# Patient Record
Sex: Female | Born: 1950 | Race: White | Hispanic: No | Smoking: Current every day smoker
Health system: Southern US, Community
[De-identification: ages and names within clinical notes are randomized; demographics above are authoritative.]

## PROBLEM LIST (undated history)

## (undated) DIAGNOSIS — J189 Pneumonia, unspecified organism: Secondary | ICD-10-CM

## (undated) DIAGNOSIS — Z8669 Personal history of other diseases of the nervous system and sense organs: Secondary | ICD-10-CM

## (undated) DIAGNOSIS — I10 Essential (primary) hypertension: Secondary | ICD-10-CM

## (undated) DIAGNOSIS — R51 Headache: Secondary | ICD-10-CM

## (undated) DIAGNOSIS — K254 Chronic or unspecified gastric ulcer with hemorrhage: Secondary | ICD-10-CM

## (undated) DIAGNOSIS — R911 Solitary pulmonary nodule: Secondary | ICD-10-CM

## (undated) DIAGNOSIS — M810 Age-related osteoporosis without current pathological fracture: Secondary | ICD-10-CM

## (undated) DIAGNOSIS — G629 Polyneuropathy, unspecified: Secondary | ICD-10-CM

## (undated) DIAGNOSIS — F419 Anxiety disorder, unspecified: Secondary | ICD-10-CM

## (undated) DIAGNOSIS — I2510 Atherosclerotic heart disease of native coronary artery without angina pectoris: Secondary | ICD-10-CM

## (undated) DIAGNOSIS — Z860100 Personal history of colon polyps, unspecified: Secondary | ICD-10-CM

## (undated) DIAGNOSIS — J45909 Unspecified asthma, uncomplicated: Secondary | ICD-10-CM

## (undated) DIAGNOSIS — M1990 Unspecified osteoarthritis, unspecified site: Secondary | ICD-10-CM

## (undated) DIAGNOSIS — F32A Depression, unspecified: Secondary | ICD-10-CM

## (undated) DIAGNOSIS — I639 Cerebral infarction, unspecified: Secondary | ICD-10-CM

## (undated) DIAGNOSIS — M797 Fibromyalgia: Secondary | ICD-10-CM

## (undated) DIAGNOSIS — F99 Mental disorder, not otherwise specified: Secondary | ICD-10-CM

## (undated) DIAGNOSIS — G8929 Other chronic pain: Secondary | ICD-10-CM

## (undated) DIAGNOSIS — N189 Chronic kidney disease, unspecified: Secondary | ICD-10-CM

## (undated) DIAGNOSIS — E11319 Type 2 diabetes mellitus with unspecified diabetic retinopathy without macular edema: Secondary | ICD-10-CM

## (undated) DIAGNOSIS — F329 Major depressive disorder, single episode, unspecified: Secondary | ICD-10-CM

## (undated) DIAGNOSIS — E7800 Pure hypercholesterolemia, unspecified: Secondary | ICD-10-CM

## (undated) DIAGNOSIS — R569 Unspecified convulsions: Secondary | ICD-10-CM

## (undated) DIAGNOSIS — R112 Nausea with vomiting, unspecified: Secondary | ICD-10-CM

## (undated) DIAGNOSIS — N183 Chronic kidney disease, stage 3 unspecified: Secondary | ICD-10-CM

## (undated) DIAGNOSIS — Z9989 Dependence on other enabling machines and devices: Secondary | ICD-10-CM

## (undated) DIAGNOSIS — R1310 Dysphagia, unspecified: Secondary | ICD-10-CM

## (undated) DIAGNOSIS — I219 Acute myocardial infarction, unspecified: Secondary | ICD-10-CM

## (undated) DIAGNOSIS — E1140 Type 2 diabetes mellitus with diabetic neuropathy, unspecified: Secondary | ICD-10-CM

## (undated) DIAGNOSIS — J40 Bronchitis, not specified as acute or chronic: Secondary | ICD-10-CM

## (undated) DIAGNOSIS — K219 Gastro-esophageal reflux disease without esophagitis: Secondary | ICD-10-CM

## (undated) DIAGNOSIS — Z9889 Other specified postprocedural states: Secondary | ICD-10-CM

## (undated) DIAGNOSIS — Z720 Tobacco use: Secondary | ICD-10-CM

## (undated) DIAGNOSIS — R0602 Shortness of breath: Secondary | ICD-10-CM

## (undated) DIAGNOSIS — R413 Other amnesia: Secondary | ICD-10-CM

## (undated) DIAGNOSIS — R011 Cardiac murmur, unspecified: Secondary | ICD-10-CM

## (undated) DIAGNOSIS — E119 Type 2 diabetes mellitus without complications: Secondary | ICD-10-CM

## (undated) DIAGNOSIS — J449 Chronic obstructive pulmonary disease, unspecified: Secondary | ICD-10-CM

## (undated) DIAGNOSIS — H4901 Third [oculomotor] nerve palsy, right eye: Secondary | ICD-10-CM

## (undated) HISTORY — DX: Atherosclerotic heart disease of native coronary artery without angina pectoris: I25.10

## (undated) HISTORY — PX: CERVICAL FUSION: SHX112

## (undated) HISTORY — DX: Other chronic pain: G89.29

## (undated) HISTORY — DX: Personal history of colon polyps, unspecified: Z86.0100

## (undated) HISTORY — PX: APPENDECTOMY: SHX54

## (undated) HISTORY — PX: BACK SURGERY: SHX140

## (undated) HISTORY — DX: Pure hypercholesterolemia, unspecified: E78.00

## (undated) HISTORY — DX: Personal history of colonic polyps: Z86.010

## (undated) HISTORY — DX: Dysphagia, unspecified: R13.10

## (undated) HISTORY — DX: Age-related osteoporosis without current pathological fracture: M81.0

## (undated) HISTORY — DX: Type 2 diabetes mellitus with diabetic neuropathy, unspecified: E11.40

## (undated) HISTORY — PX: ESOPHAGOGASTRODUODENOSCOPY: SHX1529

## (undated) HISTORY — DX: Unspecified convulsions: R56.9

## (undated) HISTORY — PX: EYE SURGERY: SHX253

## (undated) HISTORY — PX: ABDOMINAL HYSTERECTOMY: SHX81

## (undated) HISTORY — DX: Solitary pulmonary nodule: R91.1

## (undated) HISTORY — DX: Acute myocardial infarction, unspecified: I21.9

## (undated) HISTORY — PX: TUBAL LIGATION: SHX77

## (undated) HISTORY — DX: Cerebral infarction, unspecified: I63.9

## (undated) HISTORY — DX: Essential (primary) hypertension: I10

## (undated) HISTORY — DX: Chronic or unspecified gastric ulcer with hemorrhage: K25.4

## (undated) HISTORY — DX: Chronic kidney disease, stage 3 unspecified: N18.30

## (undated) HISTORY — DX: Tobacco use: Z72.0

## (undated) HISTORY — DX: Type 2 diabetes mellitus with unspecified diabetic retinopathy without macular edema: E11.319

---

## 1982-07-05 HISTORY — PX: TUBAL LIGATION: SHX77

## 2005-03-05 ENCOUNTER — Inpatient Hospital Stay (HOSPITAL_COMMUNITY): Admission: RE | Admit: 2005-03-05 | Discharge: 2005-03-07 | Payer: Self-pay | Admitting: Neurosurgery

## 2012-07-19 ENCOUNTER — Other Ambulatory Visit: Payer: Self-pay | Admitting: Neurological Surgery

## 2012-07-19 DIAGNOSIS — M545 Low back pain, unspecified: Secondary | ICD-10-CM

## 2012-07-23 ENCOUNTER — Ambulatory Visit
Admission: RE | Admit: 2012-07-23 | Discharge: 2012-07-23 | Disposition: A | Discharge status: Home / Self Care | Payer: No Typology Code available for payment source | Source: Ambulatory Visit | Attending: Neurological Surgery | Admitting: Neurological Surgery

## 2012-07-23 DIAGNOSIS — M545 Low back pain, unspecified: Secondary | ICD-10-CM

## 2012-11-10 ENCOUNTER — Other Ambulatory Visit: Payer: Self-pay | Admitting: Neurological Surgery

## 2012-11-17 ENCOUNTER — Encounter (HOSPITAL_COMMUNITY)
Admission: RE | Admit: 2012-11-17 | Discharge: 2012-11-17 | Disposition: A | Discharge status: Home / Self Care | Payer: BC Managed Care – PPO | Source: Ambulatory Visit | Attending: Anesthesiology | Admitting: Anesthesiology

## 2012-11-17 ENCOUNTER — Encounter (HOSPITAL_COMMUNITY): Payer: Self-pay

## 2012-11-17 ENCOUNTER — Encounter (HOSPITAL_COMMUNITY)
Admission: RE | Admit: 2012-11-17 | Discharge: 2012-11-17 | Disposition: A | Discharge status: Home / Self Care | Payer: BC Managed Care – PPO | Source: Ambulatory Visit | Attending: Neurological Surgery | Admitting: Neurological Surgery

## 2012-11-17 DIAGNOSIS — R011 Cardiac murmur, unspecified: Secondary | ICD-10-CM | POA: Insufficient documentation

## 2012-11-17 DIAGNOSIS — R0602 Shortness of breath: Secondary | ICD-10-CM | POA: Insufficient documentation

## 2012-11-17 DIAGNOSIS — Z01810 Encounter for preprocedural cardiovascular examination: Secondary | ICD-10-CM | POA: Insufficient documentation

## 2012-11-17 DIAGNOSIS — J45909 Unspecified asthma, uncomplicated: Secondary | ICD-10-CM | POA: Insufficient documentation

## 2012-11-17 DIAGNOSIS — K219 Gastro-esophageal reflux disease without esophagitis: Secondary | ICD-10-CM | POA: Insufficient documentation

## 2012-11-17 DIAGNOSIS — J4489 Other specified chronic obstructive pulmonary disease: Secondary | ICD-10-CM | POA: Insufficient documentation

## 2012-11-17 DIAGNOSIS — Z01818 Encounter for other preprocedural examination: Secondary | ICD-10-CM | POA: Insufficient documentation

## 2012-11-17 DIAGNOSIS — Z01812 Encounter for preprocedural laboratory examination: Secondary | ICD-10-CM | POA: Insufficient documentation

## 2012-11-17 DIAGNOSIS — M4800 Spinal stenosis, site unspecified: Secondary | ICD-10-CM | POA: Insufficient documentation

## 2012-11-17 DIAGNOSIS — J449 Chronic obstructive pulmonary disease, unspecified: Secondary | ICD-10-CM | POA: Insufficient documentation

## 2012-11-17 HISTORY — DX: Other specified postprocedural states: R11.2

## 2012-11-17 HISTORY — DX: Depression, unspecified: F32.A

## 2012-11-17 HISTORY — DX: Cardiac murmur, unspecified: R01.1

## 2012-11-17 HISTORY — DX: Polyneuropathy, unspecified: G62.9

## 2012-11-17 HISTORY — DX: Major depressive disorder, single episode, unspecified: F32.9

## 2012-11-17 HISTORY — DX: Unspecified osteoarthritis, unspecified site: M19.90

## 2012-11-17 HISTORY — DX: Anxiety disorder, unspecified: F41.9

## 2012-11-17 HISTORY — DX: Mental disorder, not otherwise specified: F99

## 2012-11-17 HISTORY — DX: Gastro-esophageal reflux disease without esophagitis: K21.9

## 2012-11-17 HISTORY — DX: Chronic obstructive pulmonary disease, unspecified: J44.9

## 2012-11-17 HISTORY — DX: Unspecified asthma, uncomplicated: J45.909

## 2012-11-17 HISTORY — DX: Other specified postprocedural states: Z98.890

## 2012-11-17 HISTORY — DX: Shortness of breath: R06.02

## 2012-11-17 HISTORY — DX: Personal history of other diseases of the nervous system and sense organs: Z86.69

## 2012-11-17 HISTORY — DX: Type 2 diabetes mellitus without complications: E11.9

## 2012-11-17 LAB — BASIC METABOLIC PANEL
Chloride: 92 mEq/L — ABNORMAL LOW (ref 96–112)
GFR calc Af Amer: >90 mL/min (ref >90)
Potassium: 4.1 mEq/L (ref 3.5–5.1)

## 2012-11-17 LAB — CBC WITH DIFFERENTIAL/PLATELET
Basophils Absolute: 0.1 10*3/uL (ref 0.0–0.1)
HCT: 45.6 % (ref 36.0–46.0)
Hemoglobin: 16.9 g/dL — ABNORMAL HIGH (ref 12.0–15.0)
Lymphocytes Relative: 38 % (ref 12–46)
Monocytes Absolute: 0.8 10*3/uL (ref 0.1–1.0)
Monocytes Relative: 7 % (ref 3–12)
Neutro Abs: 5.6 10*3/uL (ref 1.7–7.7)
RDW: 12.7 % (ref 11.5–15.5)
WBC: 11.1 10*3/uL — ABNORMAL HIGH (ref 4.0–10.5)

## 2012-11-17 LAB — TYPE AND SCREEN
ABO/RH(D): O POS
Antibody Screen: NEGATIVE

## 2012-11-17 LAB — SURGICAL PCR SCREEN
MRSA, PCR: NEGATIVE
Staphylococcus aureus: NEGATIVE

## 2012-11-17 NOTE — Pre-Procedure Instructions (Signed)
ANGELIC SCHNELLE  11/17/2012   Your procedure is scheduled on:  Thursday, May 22nd  Report to [facility] at 0530 AM.  Call this number if you have problems the morning of surgery: [phone]   Remember:   Do not eat food or drink liquids after midnight.    Take these medicines the morning of surgery with A SIP OF WATER:    Do not wear jewelry, make-up or nail polish.  Do not wear lotions, powders, or perfumes,deodorant.  Do not shave 48 hours prior to surgery.  Do not bring valuables to the hospital.  Contacts, dentures or bridgework may not be worn into surgery.  Leave suitcase in the car. After surgery it may be brought to your room.  For patients admitted to the hospital, checkout time is 11:00 AM the day of discharge.   Patients discharged the day of surgery will not be allowed to drive home.    Special Instructions: Shower using CHG 2 nights before surgery and the night before surgery.  If you shower the day of surgery use CHG.  Use special wash - you have one bottle of CHG for all showers.  You should use approximately 1/3 of the bottle for each shower.   Please read over the following fact sheets that you were given: Pain Booklet, Coughing and Deep Breathing, Blood Transfusion Information, MRSA Information and Surgical Site Infection Prevention

## 2012-11-17 NOTE — Progress Notes (Addendum)
Primary physician - Dr. Alvira Monday in [place] No cardiologist - no previous cardiac testing  Patient states normal fasting blood sugar is usually around 275. She takes 6 units of novolog "with her biggest meal of day" so only once per day and takes 30 units of lantus at bedtime

## 2012-11-21 NOTE — Progress Notes (Signed)
Anesthesia Chart Review:  Patient is a 62 year old female posted for one level PLIF by Dr. Yetta Barre on 11/23/12.  History includes former smoker, post-operative N/V, DM on insulin, COPD, depression, anxiety, asthma, GERD, arthritis, hysterectomy, heart murmur (not specified), cervical fusion. No PCP is listed.  EKG on 11/17/12 showed NSR.  CXR on 11/17/12 showed no acute disease.  Preoperative labs noted.  Non-fasting glucose is 470.  K+ 4.1.  WBC 11.1.  I called abnormal lab results to [place] at [facility] yesterday afternoon and recommended that patient be re-evaluated (or at least communication) by her PCP for better glucose control pre-operatively.  Erie Noe was told patient is a risk for being canceled if she presents with a significantly elevated glucose reading.  Erie Noe will contact the patient regarding PCP follow-up for hyperglycemia.  Anesthesiologist Dr. Michelle Piper updated on this plan.  I also left a message for patient to call me.  Velna Ochs [facility]/Anesthesiology Phone [phone] 11/21/2012 11:07 AM

## 2012-11-22 MED ORDER — DEXAMETHASONE SODIUM PHOSPHATE 10 MG/ML IJ SOLN
10.0000 mg | INTRAMUSCULAR | Status: AC
  Administered 2012-11-23: 10 mg via INTRAVENOUS
  Filled 2012-11-22: qty 1

## 2012-11-23 ENCOUNTER — Encounter (HOSPITAL_COMMUNITY)
Admission: RE | Disposition: A | Discharge status: Home / Self Care | Payer: Self-pay | Source: Ambulatory Visit | Attending: Neurological Surgery

## 2012-11-23 ENCOUNTER — Encounter (HOSPITAL_COMMUNITY): Payer: Self-pay | Admitting: Vascular Surgery

## 2012-11-23 ENCOUNTER — Encounter (HOSPITAL_COMMUNITY): Payer: Self-pay

## 2012-11-23 ENCOUNTER — Ambulatory Visit (HOSPITAL_COMMUNITY): Payer: BC Managed Care – PPO

## 2012-11-23 ENCOUNTER — Ambulatory Visit (HOSPITAL_COMMUNITY): Payer: BC Managed Care – PPO | Admitting: Anesthesiology

## 2012-11-23 ENCOUNTER — Inpatient Hospital Stay (HOSPITAL_COMMUNITY)
Admission: RE | Admit: 2012-11-23 | Discharge: 2012-11-25 | DRG: 755 | Disposition: A | Discharge status: Home / Self Care | Payer: BC Managed Care – PPO | Source: Ambulatory Visit | Attending: Neurological Surgery | Admitting: Neurological Surgery

## 2012-11-23 DIAGNOSIS — K219 Gastro-esophageal reflux disease without esophagitis: Secondary | ICD-10-CM | POA: Diagnosis present

## 2012-11-23 DIAGNOSIS — E119 Type 2 diabetes mellitus without complications: Secondary | ICD-10-CM | POA: Diagnosis present

## 2012-11-23 DIAGNOSIS — J449 Chronic obstructive pulmonary disease, unspecified: Secondary | ICD-10-CM | POA: Diagnosis present

## 2012-11-23 DIAGNOSIS — Z880 Allergy status to penicillin: Secondary | ICD-10-CM

## 2012-11-23 DIAGNOSIS — M5137 Other intervertebral disc degeneration, lumbosacral region: Secondary | ICD-10-CM | POA: Diagnosis present

## 2012-11-23 DIAGNOSIS — Z79899 Other long term (current) drug therapy: Secondary | ICD-10-CM

## 2012-11-23 DIAGNOSIS — M129 Arthropathy, unspecified: Secondary | ICD-10-CM | POA: Diagnosis present

## 2012-11-23 DIAGNOSIS — F411 Generalized anxiety disorder: Secondary | ICD-10-CM | POA: Diagnosis present

## 2012-11-23 DIAGNOSIS — F3289 Other specified depressive episodes: Secondary | ICD-10-CM | POA: Diagnosis present

## 2012-11-23 DIAGNOSIS — Z981 Arthrodesis status: Secondary | ICD-10-CM

## 2012-11-23 DIAGNOSIS — Z794 Long term (current) use of insulin: Secondary | ICD-10-CM

## 2012-11-23 DIAGNOSIS — Z87891 Personal history of nicotine dependence: Secondary | ICD-10-CM

## 2012-11-23 DIAGNOSIS — M47817 Spondylosis without myelopathy or radiculopathy, lumbosacral region: Principal | ICD-10-CM | POA: Diagnosis present

## 2012-11-23 DIAGNOSIS — J4489 Other specified chronic obstructive pulmonary disease: Secondary | ICD-10-CM | POA: Diagnosis present

## 2012-11-23 DIAGNOSIS — F329 Major depressive disorder, single episode, unspecified: Secondary | ICD-10-CM | POA: Diagnosis present

## 2012-11-23 DIAGNOSIS — M51379 Other intervertebral disc degeneration, lumbosacral region without mention of lumbar back pain or lower extremity pain: Secondary | ICD-10-CM | POA: Diagnosis present

## 2012-11-23 LAB — GLUCOSE, CAPILLARY
Glucose-Capillary: 224 mg/dL — ABNORMAL HIGH (ref 70–99)
Glucose-Capillary: 287 mg/dL — ABNORMAL HIGH (ref 70–99)
Glucose-Capillary: 303 mg/dL — ABNORMAL HIGH (ref 70–99)

## 2012-11-23 SURGERY — FOR MAXIMUM ACCESS (MAS) POSTERIOR LUMBAR INTERBODY FUSION (PLIF) 1 LEVEL
Anesthesia: General | Site: Spine Lumbar | Wound class: Clean

## 2012-11-23 MED ORDER — SODIUM CHLORIDE 0.9 % IR SOLN
Status: DC | PRN
  Administered 2012-11-23: 09:00:00

## 2012-11-23 MED ORDER — PROPOFOL 10 MG/ML IV BOLUS
INTRAVENOUS | Status: DC | PRN
  Administered 2012-11-23: 130 mg via INTRAVENOUS

## 2012-11-23 MED ORDER — METHOCARBAMOL 100 MG/ML IJ SOLN
500.0000 mg | Freq: Four times a day (QID) | INTRAVENOUS | Status: DC | PRN
  Administered 2012-11-23: 500 mg via INTRAVENOUS
  Filled 2012-11-23 (×3): qty 5

## 2012-11-23 MED ORDER — INSULIN ASPART 100 UNIT/ML ~~LOC~~ SOLN: 6.0000 [IU] | Freq: Every day | SUBCUTANEOUS | Status: DC

## 2012-11-23 MED ORDER — MENTHOL 3 MG MT LOZG: 1.0000 | LOZENGE | OROMUCOSAL | Status: DC | PRN

## 2012-11-23 MED ORDER — HYDROMORPHONE HCL PF 1 MG/ML IJ SOLN
INTRAMUSCULAR | Status: AC
  Filled 2012-11-23: qty 1

## 2012-11-23 MED ORDER — SODIUM CHLORIDE 0.9 % IV SOLN
INTRAVENOUS | Status: AC
  Filled 2012-11-23: qty 500

## 2012-11-23 MED ORDER — INSULIN GLARGINE 100 UNIT/ML ~~LOC~~ SOLN
30.0000 [IU] | Freq: Every day | SUBCUTANEOUS | Status: DC
  Administered 2012-11-23 – 2012-11-24 (×2): 30 [IU] via SUBCUTANEOUS
  Filled 2012-11-23 (×3): qty 0.3

## 2012-11-23 MED ORDER — SODIUM CHLORIDE 0.9 % IJ SOLN
3.0000 mL | INTRAMUSCULAR | Status: DC | PRN
  Administered 2012-11-25: 3 mL via INTRAVENOUS

## 2012-11-23 MED ORDER — THROMBIN 20000 UNITS EX SOLR
CUTANEOUS | Status: DC | PRN
  Administered 2012-11-23: 09:00:00 via TOPICAL

## 2012-11-23 MED ORDER — SCOPOLAMINE 1 MG/3DAYS TD PT72
MEDICATED_PATCH | TRANSDERMAL | Status: AC
  Administered 2012-11-23: 1 via TRANSDERMAL
  Filled 2012-11-23: qty 1

## 2012-11-23 MED ORDER — ZOLPIDEM TARTRATE 5 MG PO TABS
5.0000 mg | ORAL_TABLET | Freq: Every day | ORAL | Status: DC
  Administered 2012-11-23 – 2012-11-24 (×2): 5 mg via ORAL
  Filled 2012-11-23 (×2): qty 1

## 2012-11-23 MED ORDER — SODIUM CHLORIDE 0.9 % IJ SOLN
3.0000 mL | Freq: Two times a day (BID) | INTRAMUSCULAR | Status: DC
  Administered 2012-11-24: 3 mL via INTRAVENOUS

## 2012-11-23 MED ORDER — PREGABALIN 50 MG PO CAPS
75.0000 mg | ORAL_CAPSULE | Freq: Two times a day (BID) | ORAL | Status: DC
  Administered 2012-11-24 – 2012-11-25 (×3): 75 mg via ORAL
  Filled 2012-11-23: qty 1
  Filled 2012-11-23 (×2): qty 3

## 2012-11-23 MED ORDER — PHENYLEPHRINE HCL 10 MG/ML IJ SOLN
INTRAMUSCULAR | Status: DC | PRN
  Administered 2012-11-23 (×5): 80 ug via INTRAVENOUS

## 2012-11-23 MED ORDER — INSULIN GLARGINE 100 UNITS/ML SOLOSTAR PEN: 30.0000 [IU] | PEN_INJECTOR | Freq: Every day | SUBCUTANEOUS | Status: DC

## 2012-11-23 MED ORDER — 0.9 % SODIUM CHLORIDE (POUR BTL) OPTIME
TOPICAL | Status: DC | PRN
  Administered 2012-11-23: 1000 mL

## 2012-11-23 MED ORDER — DULOXETINE HCL 60 MG PO CPEP
60.0000 mg | ORAL_CAPSULE | Freq: Every day | ORAL | Status: DC
  Administered 2012-11-24 – 2012-11-25 (×2): 60 mg via ORAL
  Filled 2012-11-23 (×2): qty 1

## 2012-11-23 MED ORDER — PHENOL 1.4 % MT LIQD: 1.0000 | OROMUCOSAL | Status: DC | PRN

## 2012-11-23 MED ORDER — METHOCARBAMOL 500 MG PO TABS
500.0000 mg | ORAL_TABLET | Freq: Four times a day (QID) | ORAL | Status: DC | PRN
  Administered 2012-11-24: 500 mg via ORAL
  Filled 2012-11-23 (×2): qty 1

## 2012-11-23 MED ORDER — LIDOCAINE HCL 4 % MT SOLN
OROMUCOSAL | Status: DC | PRN
  Administered 2012-11-23: 4 mL via TOPICAL

## 2012-11-23 MED ORDER — FENTANYL CITRATE 0.05 MG/ML IJ SOLN
INTRAMUSCULAR | Status: DC | PRN
  Administered 2012-11-23: 100 ug via INTRAVENOUS
  Administered 2012-11-23 (×5): 50 ug via INTRAVENOUS

## 2012-11-23 MED ORDER — ONDANSETRON HCL 4 MG/2ML IJ SOLN
INTRAMUSCULAR | Status: DC | PRN
  Administered 2012-11-23: 4 mg via INTRAVENOUS

## 2012-11-23 MED ORDER — INSULIN ASPART 100 UNIT/ML ~~LOC~~ SOLN
0.0000 [IU] | Freq: Every day | SUBCUTANEOUS | Status: DC
  Administered 2012-11-23: 4 [IU] via SUBCUTANEOUS
  Administered 2012-11-24: 2 [IU] via SUBCUTANEOUS

## 2012-11-23 MED ORDER — SUCCINYLCHOLINE CHLORIDE 20 MG/ML IJ SOLN
INTRAMUSCULAR | Status: DC | PRN
  Administered 2012-11-23: 100 mg via INTRAVENOUS

## 2012-11-23 MED ORDER — MIDAZOLAM HCL 5 MG/5ML IJ SOLN
INTRAMUSCULAR | Status: DC | PRN
  Administered 2012-11-23: 1 mg via INTRAVENOUS

## 2012-11-23 MED ORDER — ONDANSETRON HCL 4 MG/2ML IJ SOLN: 4.0000 mg | INTRAMUSCULAR | Status: DC | PRN

## 2012-11-23 MED ORDER — CELECOXIB 200 MG PO CAPS
200.0000 mg | ORAL_CAPSULE | Freq: Two times a day (BID) | ORAL | Status: DC
  Administered 2012-11-23 – 2012-11-25 (×4): 200 mg via ORAL
  Filled 2012-11-23 (×7): qty 1

## 2012-11-23 MED ORDER — BUPIVACAINE HCL (PF) 0.25 % IJ SOLN
INTRAMUSCULAR | Status: DC | PRN
  Administered 2012-11-23: 6 mL

## 2012-11-23 MED ORDER — ACETAMINOPHEN 325 MG PO TABS: 650.0000 mg | ORAL_TABLET | ORAL | Status: DC | PRN

## 2012-11-23 MED ORDER — INSULIN ASPART 100 UNIT/ML FLEXPEN: 6.0000 [IU] | PEN_INJECTOR | Freq: Every day | SUBCUTANEOUS | Status: DC

## 2012-11-23 MED ORDER — HYDROMORPHONE HCL PF 1 MG/ML IJ SOLN
0.5000 mg | INTRAMUSCULAR | Status: DC | PRN
  Administered 2012-11-23: 1 mg via INTRAVENOUS
  Filled 2012-11-23: qty 1

## 2012-11-23 MED ORDER — POTASSIUM CHLORIDE IN NACL 20-0.9 MEQ/L-% IV SOLN
INTRAVENOUS | Status: DC
  Administered 2012-11-23 – 2012-11-24 (×2): via INTRAVENOUS
  Filled 2012-11-23 (×5): qty 1000

## 2012-11-23 MED ORDER — LIDOCAINE HCL (CARDIAC) 20 MG/ML IV SOLN
INTRAVENOUS | Status: DC | PRN
  Administered 2012-11-23: 70 mg via INTRAVENOUS

## 2012-11-23 MED ORDER — OXYCODONE HCL 5 MG PO TABS
10.0000 mg | ORAL_TABLET | ORAL | Status: DC | PRN
  Administered 2012-11-23 – 2012-11-25 (×8): 10 mg via ORAL
  Filled 2012-11-23 (×9): qty 2

## 2012-11-23 MED ORDER — INSULIN ASPART 100 UNIT/ML ~~LOC~~ SOLN
0.0000 [IU] | Freq: Three times a day (TID) | SUBCUTANEOUS | Status: DC
  Administered 2012-11-23: 8 [IU] via SUBCUTANEOUS
  Administered 2012-11-24 (×2): 5 [IU] via SUBCUTANEOUS
  Administered 2012-11-24: 3 [IU] via SUBCUTANEOUS

## 2012-11-23 MED ORDER — LACTATED RINGERS IV SOLN
INTRAVENOUS | Status: DC | PRN
  Administered 2012-11-23 (×2): via INTRAVENOUS

## 2012-11-23 MED ORDER — BACITRACIN 50000 UNITS IM SOLR
INTRAMUSCULAR | Status: AC
  Filled 2012-11-23: qty 1

## 2012-11-23 MED ORDER — ALBUMIN HUMAN 5 % IV SOLN
INTRAVENOUS | Status: DC | PRN
  Administered 2012-11-23: 09:00:00 via INTRAVENOUS

## 2012-11-23 MED ORDER — THROMBIN 5000 UNITS EX SOLR
OROMUCOSAL | Status: DC | PRN
  Administered 2012-11-23: 09:00:00 via TOPICAL

## 2012-11-23 MED ORDER — CEFAZOLIN SODIUM 1-5 GM-% IV SOLN: 1.0000 g | Freq: Three times a day (TID) | INTRAVENOUS | Status: DC

## 2012-11-23 MED ORDER — PHENYLEPHRINE HCL 10 MG/ML IJ SOLN
10.0000 mg | INTRAVENOUS | Status: DC | PRN
  Administered 2012-11-23: 20 ug/min via INTRAVENOUS

## 2012-11-23 MED ORDER — ARTIFICIAL TEARS OP OINT
TOPICAL_OINTMENT | OPHTHALMIC | Status: DC | PRN
  Administered 2012-11-23: 1 via OPHTHALMIC

## 2012-11-23 MED ORDER — SENNA 8.6 MG PO TABS
1.0000 | ORAL_TABLET | Freq: Two times a day (BID) | ORAL | Status: DC
  Administered 2012-11-23 – 2012-11-25 (×4): 8.6 mg via ORAL
  Filled 2012-11-23 (×5): qty 1

## 2012-11-23 MED ORDER — SODIUM CHLORIDE 0.9 % IV SOLN: 250.0000 mL | INTRAVENOUS | Status: DC

## 2012-11-23 MED ORDER — VANCOMYCIN HCL IN DEXTROSE 1-5 GM/200ML-% IV SOLN
1000.0000 mg | Freq: Once | INTRAVENOUS | Status: DC
  Filled 2012-11-23: qty 200

## 2012-11-23 MED ORDER — ONDANSETRON HCL 4 MG/2ML IJ SOLN: 4.0000 mg | Freq: Four times a day (QID) | INTRAMUSCULAR | Status: DC | PRN

## 2012-11-23 MED ORDER — ACETAMINOPHEN 650 MG RE SUPP: 650.0000 mg | RECTAL | Status: DC | PRN

## 2012-11-23 MED ORDER — ACETAMINOPHEN 10 MG/ML IV SOLN
1000.0000 mg | Freq: Four times a day (QID) | INTRAVENOUS | Status: AC
  Administered 2012-11-23 – 2012-11-24 (×4): 1000 mg via INTRAVENOUS
  Filled 2012-11-23 (×5): qty 100

## 2012-11-23 MED ORDER — HYDROMORPHONE HCL PF 1 MG/ML IJ SOLN
0.2500 mg | INTRAMUSCULAR | Status: DC | PRN
  Administered 2012-11-23 (×4): 0.5 mg via INTRAVENOUS

## 2012-11-23 MED ORDER — VANCOMYCIN HCL IN DEXTROSE 1-5 GM/200ML-% IV SOLN
INTRAVENOUS | Status: AC
  Administered 2012-11-23: 1000 mg via INTRAVENOUS
  Filled 2012-11-23: qty 200

## 2012-11-23 MED ORDER — VANCOMYCIN HCL IN DEXTROSE 750-5 MG/150ML-% IV SOLN
750.0000 mg | Freq: Two times a day (BID) | INTRAVENOUS | Status: AC
  Administered 2012-11-23 – 2012-11-24 (×2): 750 mg via INTRAVENOUS
  Filled 2012-11-23 (×2): qty 150

## 2012-11-23 SURGICAL SUPPLY — 67 items
APL SKNCLS STERI-STRIP NONHPOA (GAUZE/BANDAGES/DRESSINGS) ×1
BAG DECANTER FOR FLEXI CONT (MISCELLANEOUS) ×2 IMPLANT
BENZOIN TINCTURE PRP APPL 2/3 (GAUZE/BANDAGES/DRESSINGS) ×2 IMPLANT
BLADE SURG ROTATE 9660 (MISCELLANEOUS) IMPLANT
BONE MATRIX OSTEOCEL PRO MED (Bone Implant) ×1 IMPLANT
BUR MATCHSTICK NEURO 3.0 LAGG (BURR) ×2 IMPLANT
CAGE COROENT 9X9X23-4 (Cage) ×2 IMPLANT
CANISTER SUCTION 2500CC (MISCELLANEOUS) ×2 IMPLANT
CLIP NEUROVISION LG (CLIP) ×1 IMPLANT
CLOTH BEACON ORANGE TIMEOUT ST (SAFETY) ×2 IMPLANT
CONT SPEC 4OZ CLIKSEAL STRL BL (MISCELLANEOUS) ×4 IMPLANT
COVER BACK TABLE 24X17X13 BIG (DRAPES) IMPLANT
COVER TABLE BACK 60X90 (DRAPES) ×2 IMPLANT
DRAPE C-ARM 42X72 X-RAY (DRAPES) ×2 IMPLANT
DRAPE C-ARMOR (DRAPES) ×2 IMPLANT
DRAPE LAPAROTOMY 100X72X124 (DRAPES) ×2 IMPLANT
DRAPE POUCH INSTRU U-SHP 10X18 (DRAPES) ×2 IMPLANT
DRAPE SURG 17X23 STRL (DRAPES) ×2 IMPLANT
DRESSING TELFA 8X3 (GAUZE/BANDAGES/DRESSINGS) ×2 IMPLANT
DRSG OPSITE 4X5.5 SM (GAUZE/BANDAGES/DRESSINGS) ×4 IMPLANT
DURAPREP 26ML APPLICATOR (WOUND CARE) ×2 IMPLANT
ELECT REM PT RETURN 9FT ADLT (ELECTROSURGICAL) ×2
ELECTRODE REM PT RTRN 9FT ADLT (ELECTROSURGICAL) ×1 IMPLANT
EVACUATOR 1/8 PVC DRAIN (DRAIN) ×2 IMPLANT
GAUZE SPONGE 4X4 16PLY XRAY LF (GAUZE/BANDAGES/DRESSINGS) IMPLANT
GLOVE BIOGEL M 8.0 STRL (GLOVE) ×4 IMPLANT
GLOVE BIOGEL PI IND STRL 7.0 (GLOVE) IMPLANT
GLOVE BIOGEL PI IND STRL 8 (GLOVE) IMPLANT
GLOVE BIOGEL PI IND STRL 8.5 (GLOVE) IMPLANT
GLOVE BIOGEL PI INDICATOR 7.0 (GLOVE) ×1
GLOVE BIOGEL PI INDICATOR 8 (GLOVE) ×2
GLOVE BIOGEL PI INDICATOR 8.5 (GLOVE) ×1
GLOVE ECLIPSE 7.5 STRL STRAW (GLOVE) ×3 IMPLANT
GLOVE ECLIPSE 8.5 STRL (GLOVE) ×1 IMPLANT
GOWN BRE IMP SLV AUR LG STRL (GOWN DISPOSABLE) IMPLANT
GOWN BRE IMP SLV AUR XL STRL (GOWN DISPOSABLE) ×4 IMPLANT
GOWN STRL REIN 2XL LVL4 (GOWN DISPOSABLE) ×2 IMPLANT
HEMOSTAT POWDER KIT SURGIFOAM (HEMOSTASIS) IMPLANT
KIT BASIN OR (CUSTOM PROCEDURE TRAY) ×2 IMPLANT
KIT NDL NVM5 EMG ELECT (KITS) IMPLANT
KIT NEEDLE NVM5 EMG ELECT (KITS) ×1 IMPLANT
KIT NEEDLE NVM5 EMG ELECTRODE (KITS) ×1
KIT ROOM TURNOVER OR (KITS) ×2 IMPLANT
MILL MEDIUM DISP (BLADE) ×1 IMPLANT
NDL HYPO 25X1 1.5 SAFETY (NEEDLE) ×1 IMPLANT
NEEDLE HYPO 25X1 1.5 SAFETY (NEEDLE) ×2 IMPLANT
NS IRRIG 1000ML POUR BTL (IV SOLUTION) ×2 IMPLANT
PACK LAMINECTOMY NEURO (CUSTOM PROCEDURE TRAY) ×2 IMPLANT
PAD ARMBOARD 7.5X6 YLW CONV (MISCELLANEOUS) ×8 IMPLANT
ROD 5.5X40MM (Rod) ×2 IMPLANT
SCREW LOCK (Screw) ×8 IMPLANT
SCREW LOCK FXNS SPNE MAS PL (Screw) IMPLANT
SCREW PLIF MAS 5.0X35 (Screw) ×2 IMPLANT
SCREW SHANK 5.0X30MM (Screw) ×2 IMPLANT
SCREW TULIP 5.5 (Screw) ×2 IMPLANT
SPONGE LAP 4X18 X RAY DECT (DISPOSABLE) IMPLANT
SPONGE SURGIFOAM ABS GEL 100 (HEMOSTASIS) ×2 IMPLANT
STRIP CLOSURE SKIN 1/2X4 (GAUZE/BANDAGES/DRESSINGS) ×4 IMPLANT
SUT VIC AB 0 CT1 18XCR BRD8 (SUTURE) ×1 IMPLANT
SUT VIC AB 0 CT1 8-18 (SUTURE) ×4
SUT VIC AB 2-0 CP2 18 (SUTURE) ×3 IMPLANT
SUT VIC AB 3-0 SH 8-18 (SUTURE) ×4 IMPLANT
SYR 20ML ECCENTRIC (SYRINGE) ×2 IMPLANT
TOWEL OR 17X24 6PK STRL BLUE (TOWEL DISPOSABLE) ×2 IMPLANT
TOWEL OR 17X26 10 PK STRL BLUE (TOWEL DISPOSABLE) ×2 IMPLANT
TRAY FOLEY CATH 14FRSI W/METER (CATHETERS) ×2 IMPLANT
WATER STERILE IRR 1000ML POUR (IV SOLUTION) ×2 IMPLANT

## 2012-11-23 NOTE — Transfer of Care (Signed)
Immediate Anesthesia Transfer of Care Note  Patient: [place]  Procedure(s) Performed: Procedure(s) with comments:  MAS POSTERIOR LUMBAR INTERBODY FUSION (PLIF) 1 LEVEL  (N/A) - Lumbar Two-three  Patient Location: PACU  Anesthesia Type:General  Level of Consciousness: awake, alert  and oriented  Airway & Oxygen Therapy: Patient Spontanous Breathing and Patient connected to face mask oxygen  Post-op Assessment: Report given to PACU RN  Post vital signs: Reviewed and stable  Complications: No apparent anesthesia complications

## 2012-11-23 NOTE — Progress Notes (Signed)
Utilization Review Completed.[place] T5/22/2014  

## 2012-11-23 NOTE — Op Note (Signed)
11/23/2012  10:26 AM  PATIENT:  Paula Hamilton  62 y.o. 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 Decompressive lumbar laminectomy L2-3 requiring more work than would be required of the typical PLIF procedure in order to adequately decompress the neural elements and address her spinal stenosis.  2. Posterior lumbar interbody fusion L2-3 using PEEK interbody cages packed with morcellized allograft and autograft   3. Posterior fixation L23 using cortical pedicle screws.    SURGEON:  Marikay Alar, MD  ASSISTANTS: Dr. Danielle Dess  ANESTHESIA:  General  EBL: 150 ml  Total I/O In: 1750 [I.V.:1500; IV Piggyback:250] Out: 650 [Urine:500; Blood:150]  BLOOD ADMINISTERED:none  DRAINS: Hemovac   INDICATION FOR PROCEDURE: This patient presented with a long history of back and leg pain. MRI showed spinal stenosis and degenerative disc disease and spondylosis it worsened over the years. Flexion extension plain from showed segmental instability at L2-3. Recommended a decompression and instrumented fusion to address her spinal stenosis and segmental instability. Patient understood the risks, benefits, and alternatives and potential outcomes and wished to proceed.  PROCEDURE DETAILS:  The patient was brought to the operating room. After induction of generalized endotracheal anesthesia the patient was rolled into the prone position on chest rolls and all pressure points were padded. The patient's lumbar region was cleaned and then prepped with DuraPrep and draped in the usual sterile fashion. Anesthesia was injected and then a dorsal midline incision was made and carried down to the lumbosacral fascia. The fascia was opened and the paraspinous musculature was taken down in a subperiosteal fashion to expose L2-3. Intraoperative fluoroscopy confirmed my level, and I started with placement of the L2  pedicle screws. The pedicle screw entry zones were identified utilizing surface landmarks and AP and lateral fluoroscopy. EMG monitoring was used. I drilled in an upper and outward direction into the pedicles of L2 the hand drill and then tapped line to line. Then placed 50 by 30 mm pedicle screws into the L2 pedicles bilaterally. I then checked these with AP and lateral fluoroscopy. Then turned my attention to the decompression and the spinous process was removed and complete lumbar laminectomies, hemi- facetectomies, and foraminotomies were performed at L2-3. The yellow ligament was removed to expose the underlying dura and nerve roots, and generous foraminotomies were performed to adequately decompress the neural elements. Once the decompression was complete, I turned my attention to the posterior lower lumbar interbody fusion. The epidural venous vasculature was coagulated and cut sharply. Disc space was incised and the initial discectomy was performed with pituitary rongeurs. The disc space was distracted with sequential distractors to a height of 10 mm. We then used a series of scrapers and shavers to prepare the endplates for fusion. The midline was prepared with Epstein curettes. Once the complete discectomy was finished, we packed an appropriate sized peek interbody cage with local autograft and morcellized allograft, gently retracted the nerve root, and tapped the cage into position at L2-3 bilaterally. The midline was packed with morselized autograft and allograft. We then turned our attention to the posterior fixation. The pedicle screw entry zones were identified at L3 utilizing surface landmarks and fluoroscopy. We probed each pedicle with the pedicle probe and tapped each pedicle with the appropriate tap. We palpated with a ball probe to assure no break in the cortex. We then placed 50 by 35 mm pedicle screws into the pedicles bilaterally  at L3.  We then placed lordotic rods into the multiaxial screw  heads of the pedicle screws and locked these in position with the locking caps and anti-torque device. We then checked our construct with AP and lateral fluoroscopy. Irrigated with copious amounts of bacitracin-containing saline solution. Placed a medium Hemovac drain through separate stab incision. Inspected the nerve roots once again to assure adequate decompression, lined to the dura with Gelfoam, and closed the muscle and the fascia with 0 Vicryl. Closed the subcutaneous tissues with 2-0 Vicryl and subcuticular tissues with 3-0 Vicryl. The skin was closed with benzoin and Steri-Strips. Dressing was then applied, the patient was awakened from general anesthesia and transported to the recovery room in stable condition. At the end of the procedure all sponge, needle and instrument counts were correct.   PLAN OF CARE: Admit to inpatient   PATIENT DISPOSITION:  PACU - hemodynamically stable.   Delay start of Pharmacological VTE agent (>24hrs) due to surgical blood loss or risk of bleeding:  yes

## 2012-11-23 NOTE — Preoperative (Signed)
Beta Blockers   Reason not to administer Beta Blockers:Not Applicable 

## 2012-11-23 NOTE — Progress Notes (Addendum)
Inpatient Diabetes Program Recommendations  AACE/ADA: New Consensus Statement on Inpatient Glycemic Control (2013)  Target Ranges:  Prepandial:   less than 140 mg/dL      Peak postprandial:   less than 180 mg/dL (1-2 hours)      Critically ill patients:  140 - 180 mg/dL   Reason for Visit: Results for [place], [place] (MRN [id number]) as of 11/23/2012 14:21  Ref. Range 11/23/2012 06:28 11/23/2012 10:43  Glucose-Capillary Latest Range: 70-99 mg/dL 409 (H) 811 (H)   Note history of diabetes.  Patient was on Lantus 30 units daily and Novolog 6 units with the biggest meal of the day. Please add Lantus 30 units daily and Novolog moderate tid with meals while in the hospital.  Also consider checking A1C to determine pre-hospitalization glycemic control.     Note patient has already been ordered Lantus 30 units daily.  Please discontinue Novolog 6 units at 1000 and add Novolog moderate correction tid with meals and HS scale.

## 2012-11-23 NOTE — Progress Notes (Signed)
ANTIBIOTIC CONSULT NOTE - INITIAL  Pharmacy Consult for vancomycin Indication: surgical prophylaxis  Allergies  Allergen Reactions  . Erythromycin Other (See Comments)    Stomach pains  . Morphine And Related Nausea And Vomiting  . Codeine Rash  . Penicillins Rash    Patient Measurements: Height: 5\' 4"  (162.6 cm) Weight: 134 lb 0.6 oz (60.8 kg) IBW/kg (Calculated) : 54.7  Vital Signs: Temp: 98.2 F (36.8 C) (05/22 1307) Temp src: Oral (05/22 1307) BP: 105/42 mmHg (05/22 1307) Pulse Rate: 103 (05/22 1307) Intake/Output from previous day:   Intake/Output from this shift: Total I/O In: 1750 [I.V.:1500; IV Piggyback:250] Out: 650 [Urine:500; Blood:150]  Labs: No results found for this basename: WBC, HGB, PLT, LABCREA, CREATININE,  in the last 72 hours Estimated Creatinine Clearance: 64.6 ml/min (by C-G formula based on Cr of 0.74). No results found for this basename: VANCOTROUGH, VANCOPEAK, VANCORANDOM, GENTTROUGH, GENTPEAK, GENTRANDOM, TOBRATROUGH, TOBRAPEAK, TOBRARND, AMIKACINPEAK, AMIKACINTROU, AMIKACIN,  in the last 72 hours    Medical History: Past Medical History  Diagnosis Date  . PONV (postoperative nausea and vomiting)   . Shortness of breath   . Heart murmur   . Anxiety   . Mental disorder   . Depression   . Asthma   . COPD (chronic obstructive pulmonary disease)   . Diabetes mellitus without complication     fasting 270s  . GERD (gastroesophageal reflux disease)   . Hx of migraines   . Neuropathy   . Arthritis     Medications:  Prescriptions prior to admission  Medication Sig Dispense Refill  . B COMPLEX-FOLIC ACID PO Take 1 tablet by mouth daily.      . Cholecalciferol (VITAMIN D) 1000 UNITS capsule Take 1,000 Units by mouth daily.      . DiphenhydrAMINE HCl (BENADRYL PO) Take 1 tablet by mouth at bedtime.      . docusate sodium (COLACE) 100 MG capsule Take 100 mg by mouth daily.      . DULoxetine (CYMBALTA) 60 MG capsule Take 60 mg by mouth  daily.      . insulin aspart (NOVOLOG FLEXPEN) 100 unit/mL SOLN FlexPen Inject 6 Units into the skin daily. With biggest meal      . insulin glargine (LANTUS) 100 units/mL SOLN Inject 30 Units into the skin daily at 10 pm.      . oxyCODONE-acetaminophen (PERCOCET/ROXICET) 5-325 MG per tablet Take 1-2 tablets by mouth every 6 (six) hours as needed for pain.      . pregabalin (LYRICA) 75 MG capsule Take 75 mg by mouth 2 (two) times daily.      . rosuvastatin (CRESTOR) 10 MG tablet Take 10 mg by mouth daily.      . vitamin C (ASCORBIC ACID) 500 MG tablet Take 500 mg by mouth daily.       Assessment: 62 yo female s/p lumbar fusion to and to receive vancomycin for 24 hours for post op prophylaxis/ Last vancomycin dose was 1000mg  given at 0730am. Last SCr= 0.74 (11/17/12) with CrCl ~65.  Plan:   -Vancomycin 750mg  q12h x2 doses -Will sign off for now, please contact pharmacy for any other needs  Harland German, Ilda Basset D 11/23/2012 1:57 PM

## 2012-11-23 NOTE — Anesthesia Preprocedure Evaluation (Signed)
Anesthesia Evaluation  Patient identified by MRN, date of birth, ID band Patient awake    Reviewed: Allergy & Precautions, H&P , NPO status , Patient's Chart, lab work & pertinent test results  History of Anesthesia Complications (+) PONV  Airway Mallampati: II  Neck ROM: full    Dental   Pulmonary shortness of breath, asthma , COPDformer smoker,          Cardiovascular     Neuro/Psych Anxiety Depression    GI/Hepatic GERD-  ,  Endo/Other  diabetes, Type 2  Renal/GU      Musculoskeletal  (+) Arthritis -,   Abdominal   Peds  Hematology   Anesthesia Other Findings   Reproductive/Obstetrics                           Anesthesia Physical Anesthesia Plan  ASA: II  Anesthesia Plan: General   Post-op Pain Management:    Induction: Intravenous  Airway Management Planned: Oral ETT  Additional Equipment:   Intra-op Plan:   Post-operative Plan: Extubation in OR  Informed Consent: I have reviewed the patients History and Physical, chart, labs and discussed the procedure including the risks, benefits and alternatives for the proposed anesthesia with the patient or authorized representative who has indicated his/her understanding and acceptance.     Plan Discussed with: CRNA, Anesthesiologist and Surgeon  Anesthesia Plan Comments:         Anesthesia Quick Evaluation

## 2012-11-23 NOTE — Progress Notes (Signed)
Called Dr. Marikay Alar to report allergy to penicillin, which causes rash. . Change antibiotic to vancomycin per Dr. Yetta Barre.

## 2012-11-23 NOTE — H&P (Signed)
Subjective: Patient is a 62 y.o. female admitted for MASPLIF L2-3. Onset of symptoms was several years ago, gradually worsening since that time.  The pain is rated severe, and is located at the across the lower back and radiates to LLE. The pain is described as aching and throbbing and occurs all day. The symptoms have been progressive. Symptoms are exacerbated by exercise and standing. MRI or CT showed instability with stenosis L2-3.   Past Medical History  Diagnosis Date  . PONV (postoperative nausea and vomiting)   . Shortness of breath   . Heart murmur   . Anxiety   . Mental disorder   . Depression   . Asthma   . COPD (chronic obstructive pulmonary disease)   . Diabetes mellitus without complication     fasting 270s  . GERD (gastroesophageal reflux disease)   . Hx of migraines   . Neuropathy   . Arthritis     Past Surgical History  Procedure Laterality Date  . Cervical fusion    . Appendectomy    . Tubal ligation    . Abdominal hysterectomy      Prior to Admission medications   Medication Sig Start Date End Date Taking? Authorizing Provider  B COMPLEX-FOLIC ACID PO Take 1 tablet by mouth daily.   Yes Historical Provider, MD  Cholecalciferol (VITAMIN D) 1000 UNITS capsule Take 1,000 Units by mouth daily.   Yes Historical Provider, MD  DiphenhydrAMINE HCl (BENADRYL PO) Take 1 tablet by mouth at bedtime.   Yes Historical Provider, MD  docusate sodium (COLACE) 100 MG capsule Take 100 mg by mouth daily.   Yes Historical Provider, MD  DULoxetine (CYMBALTA) 60 MG capsule Take 60 mg by mouth daily.   Yes Historical Provider, MD  insulin aspart (NOVOLOG FLEXPEN) 100 unit/mL SOLN FlexPen Inject 6 Units into the skin daily. With biggest meal   Yes Historical Provider, MD  insulin glargine (LANTUS) 100 units/mL SOLN Inject 30 Units into the skin daily at 10 pm.   Yes Historical Provider, MD  oxyCODONE-acetaminophen (PERCOCET/ROXICET) 5-325 MG per tablet Take 1-2 tablets by mouth every 6  (six) hours as needed for pain.   Yes Historical Provider, MD  pregabalin (LYRICA) 75 MG capsule Take 75 mg by mouth 2 (two) times daily.   Yes Historical Provider, MD  rosuvastatin (CRESTOR) 10 MG tablet Take 10 mg by mouth daily.   Yes Historical Provider, MD  vitamin C (ASCORBIC ACID) 500 MG tablet Take 500 mg by mouth daily.   Yes Historical Provider, MD   Allergies  Allergen Reactions  . Erythromycin Other (See Comments)    Stomach pains  . Morphine And Related Nausea And Vomiting  . Codeine Rash  . Penicillins Rash    History  Substance Use Topics  . Smoking status: Former Games developer  . Smokeless tobacco: Not on file  . Alcohol Use: No    History reviewed. No pertinent family history.   Review of Systems  Positive ROS: neg  All other systems have been reviewed and were otherwise negative with the exception of those mentioned in the HPI and as above.  Objective: Vital signs in last 24 hours: Temp:  [97.1 F (36.2 C)] 97.1 F (36.2 C) (05/22 0620) Pulse Rate:  [93] 93 (05/22 0620) Resp:  [20] 20 (05/22 0620) BP: (101)/(67) 101/67 mmHg (05/22 0620) SpO2:  [97 %] 97 % (05/22 0620)  General Appearance: Alert, cooperative, no distress, appears stated age Head: Normocephalic, without obvious abnormality, atraumatic Eyes: PERRL,  conjunctiva/corneas clear, EOM's intact Neck: Supple Back: Symmetric, no curvature Lungs:  respirations unlabored Heart: Regular rate and rhythm Abdomen: Soft, non-tender Extremities: Extremities normal, atraumatic, no cyanosis or edema Pulses: 2+ and symmetric all extremities   NEUROLOGIC:   Mental status: Alert and oriented x4,  no aphasia, good attention span, fund of knowledge, and memory Motor Exam - grossly normal Sensory Exam - grossly normal Reflexes: 1+ Coordination - grossly normal Gait - grossly normal Balance - grossly normal Cranial Nerves: I: smell Not tested  II: visual acuity  OS: nl    OD: nl  II: visual fields Full to  confrontation  II: pupils Equal, round, reactive to light  III,VII: ptosis None  III,IV,VI: extraocular muscles  Full ROM  V: mastication Normal  V: facial light touch sensation  Normal  V,VII: corneal reflex  Present  VII: facial muscle function - upper  Normal  VII: facial muscle function - lower Normal  VIII: hearing Not tested  IX: soft palate elevation  Normal  IX,X: gag reflex Present  XI: trapezius strength  5/5  XI: sternocleidomastoid strength 5/5  XI: neck flexion strength  5/5  XII: tongue strength  Normal    Data Review Lab Results  Component Value Date   WBC 11.1* 11/17/2012   HGB 16.9* 11/17/2012   HCT 45.6 11/17/2012   MCV 89.6 11/17/2012   PLT 340 11/17/2012   Lab Results  Component Value Date   NA 131* 11/17/2012   K 4.1 11/17/2012   CL 92* 11/17/2012   CO2 22 11/17/2012   BUN 13 11/17/2012   CREATININE 0.74 11/17/2012   GLUCOSE 470* 11/17/2012   Lab Results  Component Value Date   INR 0.97 11/17/2012    Assessment/Plan: Patient admitted for MASPLIF L2-3. Patient has failed conservative therapy.  I explained the condition and procedure to the patient and answered any questions.  Patient wishes to proceed with procedure as planned. Understands risks/ benefits and typical outcomes of procedure.   Devynn Scheff S 11/23/2012 7:02 AM

## 2012-11-23 NOTE — Anesthesia Postprocedure Evaluation (Signed)
Anesthesia Post Note  Patient: [place]  Procedure(s) Performed: Procedure(s) (LRB):  MAS POSTERIOR LUMBAR INTERBODY FUSION (PLIF) 1 LEVEL  (N/A)  Anesthesia type: General  Patient location: PACU  Post pain: Pain level controlled and Adequate analgesia  Post assessment: Post-op Vital signs reviewed, Patient's Cardiovascular Status Stable, Respiratory Function Stable, Patent Airway and Pain level controlled  Last Vitals:  Filed Vitals:   11/23/12 1037  BP: 105/51  Pulse: 90  Temp: 36.7 C  Resp:     Post vital signs: Reviewed and stable  Level of consciousness: awake, alert  and oriented  Complications: No apparent anesthesia complications

## 2012-11-24 LAB — HEMOGLOBIN A1C: Hgb A1c MFr Bld: 10.3 % — ABNORMAL HIGH (ref <5.7)

## 2012-11-24 LAB — GLUCOSE, CAPILLARY
Glucose-Capillary: 241 mg/dL — ABNORMAL HIGH (ref 70–99)
Glucose-Capillary: 248 mg/dL — ABNORMAL HIGH (ref 70–99)

## 2012-11-24 MED ORDER — PNEUMOCOCCAL VAC POLYVALENT 25 MCG/0.5ML IJ INJ
0.5000 mL | INJECTION | INTRAMUSCULAR | Status: AC
  Administered 2012-11-25: 0.5 mL via INTRAMUSCULAR
  Filled 2012-11-24: qty 0.5

## 2012-11-24 NOTE — Evaluation (Signed)
Physical Therapy Evaluation Patient Details Name: Paula Hamilton MRN: [id number] DOB: [birth year] Today's Date: 11/24/2012 Time: 4098-1191 PT Time Calculation (min): 21 min  PT Assessment / Plan / Recommendation Clinical Impression  Patient is a 61 yo female s/p L2-3 PLIF.  Did well with mobility today.  Will benefit from acute PT to maximize independence prior to discharge home with family.  Do not anticipate any f/u PT needs.    PT Assessment  Patient needs continued PT services    Follow Up Recommendations  No PT follow up;Supervision/Assistance - 24 hour    Does the patient have the potential to tolerate intense rehabilitation      Barriers to Discharge None      Equipment Recommendations  Rolling walker with 5" wheels (3-in-1 BSC)    Recommendations for Other Services     Frequency Min 5X/week    Precautions / Restrictions Precautions Precautions: Back Precaution Booklet Issued: Yes (comment) Precaution Comments: Provided education on back precautions and mobility while maintaining precautions. Required Braces or Orthoses: Spinal Brace Spinal Brace: Lumbar corset;Applied in sitting position Restrictions Weight Bearing Restrictions: No   Pertinent Vitals/Pain       Mobility  Bed Mobility Bed Mobility: Sit to Sidelying Left Supine to Sit: 4: Min guard;HOB flat Sit to Sidelying Left: 5: Supervision;HOB flat Details for Bed Mobility Assistance: Verbal cues to doff brace.  Cues for technique to move to sidelying and roll to back to maintain back precautions.  Patient with good technique. Transfers Transfers: Sit to Stand;Stand to Sit Sit to Stand: 4: Min guard;With upper extremity assist;With armrests;From chair/3-in-1;From toilet Stand to Sit: 4: Min guard;With upper extremity assist;To toilet;To bed Details for Transfer Assistance: Verbal cues for hand placement and technique. Ambulation/Gait Ambulation/Gait Assistance: 4: Min guard Ambulation Distance  (Feet): 175 Feet Assistive device: Rolling walker Ambulation/Gait Assistance Details: Verbal cues for safe use of RW.  Patient with good gait pattern and upright posture.  Cues to turn body (no twist) when reaching for objects or talking with others. Gait Pattern: Step-through pattern;Decreased step length - right;Decreased step length - left Gait velocity: Slow gait speed    Exercises     PT Diagnosis: Difficulty walking;Acute pain  PT Problem List: Decreased strength;Decreased activity tolerance;Decreased mobility;Decreased balance;Decreased knowledge of use of DME;Decreased knowledge of precautions;Pain PT Treatment Interventions: DME instruction;Gait training;Functional mobility training;Patient/family education   PT Goals Acute Rehab PT Goals PT Goal Formulation: With patient Time For Goal Achievement: 12/01/12 Potential to Achieve Goals: Good Pt will Roll Supine to Right Side: Independently PT Goal: Rolling Supine to Right Side - Progress: Goal set today Pt will Roll Supine to Left Side: Independently PT Goal: Rolling Supine to Left Side - Progress: Goal set today Pt will go Supine/Side to Sit: Independently;with HOB 0 degrees PT Goal: Supine/Side to Sit - Progress: Goal set today Pt will go Sit to Supine/Side: Independently;with HOB 0 degrees PT Goal: Sit to Supine/Side - Progress: Goal set today Pt will go Sit to Stand: with modified independence;with upper extremity assist PT Goal: Sit to Stand - Progress: Goal set today Pt will Ambulate: >150 feet;with modified independence;with rolling walker PT Goal: Ambulate - Progress: Goal set today Additional Goals Additional Goal #1: Patient will recall 3/3 back precautions and integrate into functional mobility PT Goal: Additional Goal #1 - Progress: Goal set today Additional Goal #2: Patient will don/doff brace independently PT Goal: Additional Goal #2 - Progress: Goal set today  Visit Information  Last PT Received On:  11/24/12 Assistance Needed: +1    Subjective Data  Subjective: "It feels better when I'm up walking" Patient Stated Goal: To go home soon   Prior Functioning  Home Living Lives With: Daughter (Son-in-law) Available Help at Discharge: Family;Available 24 hours/day (22yo granddaughter lives next door. Dtr/granddtr do not work) Type of Home: House Home Access: Ramped entrance Home Layout: One level Bathroom Shower/Tub: Counselling psychologist: Yes How Accessible: Accessible via walker Home Adaptive Equipment: Straight cane Prior Function Level of Independence: Independent with assistive device(s);Needs assistance (Used cane at times) Needs Assistance: Bathing;Light Housekeeping Bath: Supervision/set-up Light Housekeeping: Moderate Able to Take Stairs?: Yes Driving: No Vocation: Workers comp Comments: Pt needed assistance with tub/shower mobility sometimes, and has not driven for 6 months PTA Communication Communication: No difficulties Dominant Hand: Right    Cognition  Cognition Arousal/Alertness: Awake/alert Behavior During Therapy: WFL for tasks assessed/performed Overall Cognitive Status: Within Functional Limits for tasks assessed    Extremity/Trunk Assessment Right Upper Extremity Assessment RUE ROM/Strength/Tone: [facility] for tasks assessed Left Upper Extremity Assessment LUE ROM/Strength/Tone: WFL for tasks assessed Right Lower Extremity Assessment RLE ROM/Strength/Tone: WFL for tasks assessed RLE Sensation: WFL - Light Touch Left Lower Extremity Assessment LLE ROM/Strength/Tone: WFL for tasks assessed LLE Sensation: WFL - Light Touch   Balance    End of Session PT - End of Session Equipment Utilized During Treatment: Gait belt;Back brace Activity Tolerance: Patient tolerated treatment well Patient left: in bed;with call bell/phone within reach Nurse Communication: Mobility status  GP     Vena Austria 11/24/2012, 1:22 PM Durenda Hurt. Renaldo Fiddler, [facility] Acute Rehab Services Pager [phone]

## 2012-11-24 NOTE — Progress Notes (Signed)
Patient ID: Paula Hamilton, female   DOB: Feb 02, 1951, 62 y.o.   MRN: [id number] Patient doing very well. No leg pain for the first time in over a year. Back appears appropriately sore. Abdomen is soft. Moves legs well. We'll get her up and walking with therapy today. Hopefully home tomorrow.

## 2012-11-24 NOTE — Progress Notes (Signed)
Inpatient Diabetes Program Recommendations  AACE/ADA: New Consensus Statement on Inpatient Glycemic Control (2013)  Target Ranges:  Prepandial:   less than 140 mg/dL      Peak postprandial:   less than 180 mg/dL (1-2 hours)      Critically ill patients:  140 - 180 mg/dL  Results for Paula Hamilton, Paula Hamilton (MRN [id number]) as of 11/24/2012 12:37  Ref. Range 11/23/2012 06:28 11/23/2012 10:43 11/23/2012 16:39 11/23/2012 21:45 11/24/2012 07:00 11/24/2012 11:46  Glucose-Capillary Latest Range: 70-99 mg/dL 130 (H) 865 (H) 784 (H) 303 (H) 229 (H) 241 (H)   Inpatient Diabetes Program Recommendations Insulin - Basal: Increase Lantus to 35 units  Thank you  Piedad Climes BSN, RN,CDE Inpatient Diabetes Coordinator [phone] (team pager)

## 2012-11-24 NOTE — Progress Notes (Signed)
I agree with the following treatment note after reviewing documentation.   Johnston, Jalal Rauch Brynn   OTR/L Pager: [phone] Office: [phone] .   

## 2012-11-24 NOTE — Progress Notes (Signed)
Occupational Therapy Evaluation Patient Details Name: Paula Hamilton MRN: [id number] DOB: 11-13-1950 Today's Date: 11/24/2012 Time: 4098-1191 OT Time Calculation (min): 36 min  OT Assessment / Plan / Recommendation Clinical Impression  Patient is a 62 y.o. female admitted for MASPLIF L2-3. Pt would benefit from OT services while in actue setting. Pt was mod independent with ADL and supervision for mobility. Pt will d/c home where she has 24 hour care from daughter and son-in-law. Will focus on tub transfer next session.     OT Assessment  Patient needs continued OT Services    Follow Up Recommendations  Supervision/Assistance - 24 hour    Barriers to Discharge None          Frequency  Min 2X/week    Precautions / Restrictions Precautions Precautions: Back Precaution Booklet Issued: Yes (comment) Precaution Comments: Pt educated on "BAT" precautions and brace precautions Required Braces or Orthoses: Spinal Brace Spinal Brace: Lumbar corset;Applied in sitting position Restrictions Weight Bearing Restrictions: No   Pertinent Vitals/Pain Pt reported pain as 5/10 and increased to 7/10 during session. Pt received pain meds from RN as session began.    ADL  Eating/Feeding: Performed;Modified independent Where Assessed - Eating/Feeding: Chair Grooming: Wash/dry hands;Performed;Min guard Where Assessed - Grooming: Unsupported standing Toilet Transfer: Performed;Min guard Statistician Method: Sit to Barista: Comfort height toilet;Grab bars Toileting - Clothing Manipulation and Hygiene: Performed;Minimal assistance Where Assessed - Engineer, mining and Hygiene: Standing Equipment Used: Back brace;Rolling walker;Gait belt Transfers/Ambulation Related to ADLs: Pt needed mod cues to keep precautions, Pt ambulates with RW at supervision.  ADL Comments: Pt performed hand washing at sink level standing unassisted.  Pt educated on home set up  to set on the right side to prevent twisting.    OT Diagnosis: Acute pain;Generalized weakness  OT Problem List: Decreased safety awareness;Decreased knowledge of precautions OT Treatment Interventions: Self-care/ADL training;Therapeutic activities;Patient/family education   OT Goals Acute Rehab OT Goals OT Goal Formulation: With patient Time For Goal Achievement: 12/08/12 Potential to Achieve Goals: Good ADL Goals Pt Will Perform Upper Body Bathing: with modified independence;Sitting in shower ADL Goal: Upper Body Bathing - Progress: Goal set today Pt Will Perform Lower Body Bathing: with modified independence;Sitting in shower ADL Goal: Lower Body Bathing - Progress: Goal set today Pt Will Perform Lower Body Dressing: with modified independence;Sit to stand from chair ADL Goal: Lower Body Dressing - Progress: Goal set today Pt Will Transfer to Toilet: with modified independence;Regular height toilet;Squat pivot transfer ADL Goal: Toilet Transfer - Progress: Goal set today Pt Will Perform Toileting - Clothing Manipulation: with modified independence;Standing ADL Goal: Toileting - Clothing Manipulation - Progress: Goal set today Pt Will Perform Toileting - Hygiene: with modified independence;Leaning right and/or left on 3-in-1/toilet ADL Goal: Toileting - Hygiene - Progress: Goal set today Pt Will Perform Tub/Shower Transfer: Tub transfer;with supervision;Maintaining back safety precautions ADL Goal: Tub/Shower Transfer - Progress: Goal set today  Visit Information  Last OT Received On: 11/24/12    Subjective Data  Subjective: "Its the first time in over year that I don't have pain in my legs"   Prior Functioning     Home Living Lives With: Family;Daughter;Other (Comment) (son in law) Available Help at Discharge: Family;Available 24 hours/day Type of Home: House Home Access: Ramped entrance Home Layout: One level Bathroom Shower/Tub: Tub/shower unit;Curtain Nurse, children's: Standard Bathroom Accessibility: Yes How Accessible: Accessible via walker Home Adaptive Equipment: Straight cane Prior Function Level of Independence: Independent with assistive  device(s) (needed cane sometimes and needed assitance in and out of tub) Able to Take Stairs?: Yes Driving: No Vocation: Workers comp Comments: Pt needed assistance with tub/shower mobility sometimes, and has not driven for 6 months PTA Communication Communication: No difficulties Dominant Hand: Right         Vision/Perception Vision - History Baseline Vision: Wears glasses all the time (usually wear contacts)   Cognition  Cognition Arousal/Alertness: Awake/alert Behavior During Therapy: WFL for tasks assessed/performed Overall Cognitive Status: Within Functional Limits for tasks assessed       Mobility Bed Mobility Bed Mobility: Supine to Sit Supine to Sit: 4: Min guard;HOB flat Details for Bed Mobility Assistance: Pt educated on rolling and bed mobility, and with mod cues was able to keep precautions Transfers Transfers: Sit to Stand;Stand to Sit Sit to Stand: 4: Min guard;From bed;From toilet;With upper extremity assist Stand to Sit: 4: Min guard;With armrests;To chair/3-in-1;To toilet Details for Transfer Assistance: vc for safe hand placement           End of Session OT - End of Session Equipment Utilized During Treatment: Gait belt;Back brace;Other (comment) (RW) Activity Tolerance: Patient tolerated treatment well Patient left: in chair;with call bell/phone within reach Nurse Communication: Mobility status;Precautions  GO     Sherryl Manges 11/24/2012, 1:14 PM

## 2012-11-25 LAB — GLUCOSE, CAPILLARY: Glucose-Capillary: 112 mg/dL — ABNORMAL HIGH (ref 70–99)

## 2012-11-25 MED ORDER — METHOCARBAMOL 500 MG PO TABS: 500.0000 mg | ORAL_TABLET | Freq: Four times a day (QID) | ORAL | Status: DC | PRN

## 2012-11-25 MED ORDER — OXYCODONE-ACETAMINOPHEN 5-325 MG PO TABS: 1.0000 | ORAL_TABLET | Freq: Four times a day (QID) | ORAL | Status: DC | PRN

## 2012-11-25 NOTE — Progress Notes (Signed)
Physical Therapy Treatment Patient Details Name: Paula Hamilton MRN: [id number] DOB: 01-16-1951 Today's Date: 11/25/2012 Time: 7846-9629 PT Time Calculation (min): 13 min  PT Assessment / Plan / Recommendation Comments on Treatment Session  Pt moving well.      Follow Up Recommendations  No PT follow up;Supervision/Assistance - 24 hour     Does the patient have the potential to tolerate intense rehabilitation     Barriers to Discharge        Equipment Recommendations  Rolling walker with 5" wheels    Recommendations for Other Services    Frequency Min 5X/week   Plan Discharge plan remains appropriate    Precautions / Restrictions Precautions Precautions: Back Precaution Comments: Pt able to recall 3/3 back precautions & does well adhering to precautions with functional mobility Required Braces or Orthoses: Spinal Brace Spinal Brace: Lumbar corset;Applied in sitting position Restrictions Weight Bearing Restrictions: No       Mobility  Bed Mobility Bed Mobility: Rolling Left;Left Sidelying to Sit;Sitting - Scoot to Edge of Bed Rolling Left: 6: Modified independent (Device/Increase time);With rail Left Sidelying to Sit: 6: Modified independent (Device/Increase time);HOB flat;With rails Supine to Sit: 6: Modified independent (Device/Increase time) Details for Bed Mobility Assistance: Pt demonstrated proper technique.  Incr. time.   Transfers Transfers: Sit to Stand;Stand to Sit Sit to Stand: 5: Supervision;With upper extremity assist;From bed Stand to Sit: 5: Supervision;With upper extremity assist;With armrests;To chair/3-in-1 Details for Transfer Assistance: Cues to reinforce hand placement Ambulation/Gait Ambulation/Gait Assistance: 5: Supervision Ambulation Distance (Feet): 200 Feet Assistive device: Rolling walker Ambulation/Gait Assistance Details: Pt with steady gait & demonstrated safe use of RW.   Gait Pattern: Step-through pattern;Decreased stride  length Gait velocity: decreased Stairs: No Wheelchair Mobility Wheelchair Mobility: No     PT Goals Acute Rehab PT Goals Time For Goal Achievement: 12/01/12 Potential to Achieve Goals: Good Pt will Roll Supine to Right Side: Independently Pt will Roll Supine to Left Side: Independently PT Goal: Rolling Supine to Left Side - Progress: Progressing toward goal Pt will go Supine/Side to Sit: Independently;with HOB 0 degrees PT Goal: Supine/Side to Sit - Progress: Progressing toward goal Pt will go Sit to Supine/Side: Independently;with HOB 0 degrees Pt will go Sit to Stand: with modified independence;with upper extremity assist PT Goal: Sit to Stand - Progress: Progressing toward goal Pt will Ambulate: >150 feet;with modified independence;with rolling walker PT Goal: Ambulate - Progress: Progressing toward goal Additional Goals Additional Goal #1: Patient will recall 3/3 back precautions and integrate into functional mobility PT Goal: Additional Goal #1 - Progress: Progressing toward goal Additional Goal #2: Patient will don/doff brace independently PT Goal: Additional Goal #2 - Progress: Progressing toward goal  Visit Information  Last PT Received On: 11/25/12 Assistance Needed: +1    Subjective Data      Cognition  Cognition Arousal/Alertness: Awake/alert Behavior During Therapy: WFL for tasks assessed/performed Overall Cognitive Status: Within Functional Limits for tasks assessed    Balance     End of Session PT - End of Session Equipment Utilized During Treatment: Gait belt;Back brace Activity Tolerance: Patient tolerated treatment well Patient left: in chair;with call bell/phone within reach Nurse Communication: Mobility status     Verdell Face, [place] [phone] 11/25/2012

## 2012-11-25 NOTE — Discharge Summary (Signed)
Physician Discharge Summary  Patient ID: Paula Hamilton MRN: [id number] DOB/AGE: 09-26-1950 62 y.o.  Admit date: 11/23/2012 Discharge date: 11/25/2012  Admission Diagnoses: lumbar stenosis/ instabilty    Discharge Diagnoses: same   Discharged Condition: good  Hospital Course: The patient was admitted on 11/23/2012 and taken to the operating room where the patient underwent PLIF L2-3. The patient tolerated the procedure well and was taken to the recovery room and then to the floor in stable condition. The hospital course was routine. There were no complications. The wound remained clean dry and intact. Pt had appropriate back soreness. No complaints of leg pain or new N/T/W. The patient remained afebrile with stable vital signs, and tolerated a regular diet. The patient continued to increase activities, and pain was well controlled with oral pain medications.   Consults: none  Significant Diagnostic Studies:  Results for orders placed during the hospital encounter of 11/23/12  GLUCOSE, CAPILLARY      Result Value Range   Glucose-Capillary 224 (*) 70 - 99 mg/dL  GLUCOSE, CAPILLARY      Result Value Range   Glucose-Capillary 255 (*) 70 - 99 mg/dL  HEMOGLOBIN B1Y      Result Value Range   Hemoglobin A1C 10.3 (*) <5.7 %   Mean Plasma Glucose 249 (*) <117 mg/dL  GLUCOSE, CAPILLARY      Result Value Range   Glucose-Capillary 287 (*) 70 - 99 mg/dL  GLUCOSE, CAPILLARY      Result Value Range   Glucose-Capillary 303 (*) 70 - 99 mg/dL   Comment 1 Documented in Chart     Comment 2 Notify RN    GLUCOSE, CAPILLARY      Result Value Range   Glucose-Capillary 229 (*) 70 - 99 mg/dL  GLUCOSE, CAPILLARY      Result Value Range   Glucose-Capillary 241 (*) 70 - 99 mg/dL   Comment 1 Notify RN    GLUCOSE, CAPILLARY      Result Value Range   Glucose-Capillary 164 (*) 70 - 99 mg/dL  GLUCOSE, CAPILLARY      Result Value Range   Glucose-Capillary 248 (*) 70 - 99 mg/dL   Comment 1  Documented in Chart     Comment 2 Notify RN    GLUCOSE, CAPILLARY      Result Value Range   Glucose-Capillary 112 (*) 70 - 99 mg/dL   Comment 1 Documented in Chart     Comment 2 Notify RN      Chest 2 View  11/17/2012   *RADIOLOGY REPORT*  Clinical Data: Preoperative respiratory exam.  Spinal stenosis.  CHEST - 2 VIEW  Comparison: Chest x-ray dated 05/22/2012 and 03/02/2005  Findings: Heart size and vascularity are normal and the lungs are clear except for a tiny area of parenchymal scarring in the right upper lobe which is unchanged since 03/02/2005.  Slight thoracolumbar scoliosis, unchanged.  IMPRESSION: No acute disease.   Original Report Authenticated By: Francene Boyers, M.D.   Dg Lumbar Spine Complete  11/23/2012   *RADIOLOGY REPORT*  Clinical Data: PLIF  DG C-ARM 61-120 MIN,LUMBAR SPINE - COMPLETE 4+ VIEW  Technique:  C-arm fluoroscopic images were obtained intraoperatively and submitted for postoperative interpretation. Please see the performing provider's procedural report for the fluoroscopy time utilized.  Fluoroscopy time:  48 seconds  Comparison: Lumbar spine radiographs dated 07/28/2012  Findings: Two frontal and two lateral fluoroscopic spot images were obtained during C2-3 PLIF.  Surgical hardware is in appropriate position.  IMPRESSION: Intraoperative fluoroscopic  images during C2-3 PLIF.   Original Report Authenticated By: Charline Bills, M.D.   Dg C-arm 61-120 Min  11/23/2012   *RADIOLOGY REPORT*  Clinical Data: PLIF  DG C-ARM 61-120 MIN,LUMBAR SPINE - COMPLETE 4+ VIEW  Technique:  C-arm fluoroscopic images were obtained intraoperatively and submitted for postoperative interpretation. Please see the performing provider's procedural report for the fluoroscopy time utilized.  Fluoroscopy time:  48 seconds  Comparison: Lumbar spine radiographs dated 07/28/2012  Findings: Two frontal and two lateral fluoroscopic spot images were obtained during C2-3 PLIF.  Surgical hardware is in  appropriate position.  IMPRESSION: Intraoperative fluoroscopic images during C2-3 PLIF.   Original Report Authenticated By: Charline Bills, M.D.    Antibiotics:  Anti-infectives   Start     Dose/Rate Route Frequency Ordered Stop   11/23/12 1900  vancomycin (VANCOCIN) IVPB 750 mg/150 ml premix     750 mg 150 mL/hr over 60 Minutes Intravenous Every 12 hours 11/23/12 1358 11/24/12 0847   11/23/12 1300  ceFAZolin (ANCEF) IVPB 1 g/50 mL premix  Status:  Discontinued     1 g 100 mL/hr over 30 Minutes Intravenous Every 8 hours 11/23/12 1257 11/23/12 1308   11/23/12 0832  bacitracin 50,000 Units in sodium chloride irrigation 0.9 % 500 mL irrigation  Status:  Discontinued       As needed 11/23/12 0833 11/23/12 1036   11/23/12 0705  vancomycin (VANCOCIN) 1 GM/200ML IVPB    Comments:  KEY, KRISTOPHER: cabinet override      11/23/12 0705 11/23/12 0730   11/23/12 0702  bacitracin 13086 UNITS injection    Comments:  DAY, DORY: cabinet override      11/23/12 0702 11/23/12 1914   11/23/12 0645  vancomycin (VANCOCIN) IVPB 1000 mg/200 mL premix  Status:  Discontinued     1,000 mg 200 mL/hr over 60 Minutes Intravenous  Once 11/23/12 0641 11/23/12 1236      Discharge Exam: Blood pressure 129/69, pulse 82, temperature 97.7 F (36.5 C), temperature source Oral, resp. rate 20, height 5\' 4"  (1.626 m), weight 60.8 kg (134 lb 0.6 oz), SpO2 98.00%.  Neuro exam normal with good strength, amb well Incision CDI  Discharge Medications:     Medication List    ASK your doctor about these medications       B COMPLEX-FOLIC ACID PO  Take 1 tablet by mouth daily.     BENADRYL PO  Take 1 tablet by mouth at bedtime.     docusate sodium 100 MG capsule  Commonly known as:  COLACE  Take 100 mg by mouth daily.     DULoxetine 60 MG capsule  Commonly known as:  CYMBALTA  Take 60 mg by mouth daily.     insulin glargine 100 units/mL Soln  Commonly known as:  LANTUS  Inject 30 Units into the skin daily at  10 pm.     LYRICA 75 MG capsule  Generic drug:  pregabalin  Take 75 mg by mouth 2 (two) times daily.     NOVOLOG FLEXPEN 100 unit/mL Soln FlexPen  Generic drug:  insulin aspart  Inject 6 Units into the skin daily. With biggest meal     oxyCODONE-acetaminophen 5-325 MG per tablet  Commonly known as:  PERCOCET/ROXICET  Take 1-2 tablets by mouth every 6 (six) hours as needed for pain.     rosuvastatin 10 MG tablet  Commonly known as:  CRESTOR  Take 10 mg by mouth daily.     vitamin C 500 MG tablet  Commonly known as:  ASCORBIC ACID  Take 500 mg by mouth daily.     Vitamin D 1000 UNITS capsule  Take 1,000 Units by mouth daily.        Disposition: home   Final Dx: PLIF L2-3        Follow-up Information   Follow up with Heidy Mccubbin S, MD. Schedule an appointment as soon as possible for a visit in 3 weeks.   Contact information:   [address] [place] [address] [phone]        Signed: Tia Alert 11/25/2012, 7:48 AM

## 2012-12-11 DIAGNOSIS — M549 Dorsalgia, unspecified: Secondary | ICD-10-CM | POA: Insufficient documentation

## 2012-12-11 DIAGNOSIS — G8929 Other chronic pain: Secondary | ICD-10-CM | POA: Insufficient documentation

## 2013-02-05 DIAGNOSIS — G619 Inflammatory polyneuropathy, unspecified: Secondary | ICD-10-CM | POA: Insufficient documentation

## 2013-02-12 ENCOUNTER — Other Ambulatory Visit: Payer: Self-pay | Admitting: Neurological Surgery

## 2013-02-12 DIAGNOSIS — M5416 Radiculopathy, lumbar region: Secondary | ICD-10-CM

## 2013-02-15 ENCOUNTER — Ambulatory Visit
Admission: RE | Admit: 2013-02-15 | Discharge: 2013-02-15 | Disposition: A | Discharge status: Home / Self Care | Payer: BC Managed Care – PPO | Source: Ambulatory Visit | Attending: Neurological Surgery | Admitting: Neurological Surgery

## 2013-02-15 VITALS — BP 112/51 | HR 75

## 2013-02-15 DIAGNOSIS — M5416 Radiculopathy, lumbar region: Secondary | ICD-10-CM

## 2013-02-15 MED ORDER — IOHEXOL 180 MG/ML  SOLN
18.0000 mL | Freq: Once | INTRAMUSCULAR | Status: AC | PRN
  Administered 2013-02-15: 18 mL via INTRATHECAL

## 2013-02-15 MED ORDER — DIAZEPAM 5 MG PO TABS
10.0000 mg | ORAL_TABLET | Freq: Once | ORAL | Status: AC
  Administered 2013-02-15: 10 mg via ORAL

## 2013-02-15 MED ORDER — HYDROMORPHONE HCL 4 MG PO TABS
4.0000 mg | ORAL_TABLET | Freq: Once | ORAL | Status: AC
  Administered 2013-02-15: 4 mg via ORAL

## 2013-02-15 NOTE — Progress Notes (Signed)
Patient states she has been off Cymbalta for at least two days.  jkl

## 2013-03-30 ENCOUNTER — Other Ambulatory Visit: Payer: Self-pay | Admitting: Neurological Surgery

## 2013-04-04 ENCOUNTER — Encounter (HOSPITAL_COMMUNITY): Payer: Self-pay | Admitting: Pharmacy Technician

## 2013-04-05 NOTE — Pre-Procedure Instructions (Signed)
SUESAN MOHRMANN  04/05/2013   Your procedure is scheduled on:  Thursday, October 9th.  Report to [facility], Main Entrance/Entrance "A" at 9:55AM.  Call this number if you have problems the morning of surgery: [phone]   Remember:   Do not eat food or drink liquids after midnight.   Take these medicines the morning of surgery with A SIP OF WATER: DULoxetine (CYMBALTA) ,pregabalin (LYRICA), pain medicine is OK if needed      Do not wear jewelry, make-up or nail polish.  Do not wear lotions, powders, or perfumes. You may wear deodorant.  Do not shave 48 hours prior to surgery.  Do not bring valuables to the hospital.  [facility] is not responsible for any belongings or valuables.               Contacts, dentures or bridgework may not be worn into surgery.  Leave suitcase in the car. After surgery it may be brought to your room.  For patients admitted to the hospital, discharge time is determined by your treatment team.                 Special Instructions: Shower using CHG 2 nights before surgery and the night before surgery.  If you shower the day of surgery use CHG.  Use special wash - you have one bottle of CHG for all showers.  You should use approximately 1/3 of the bottle for each shower.   Please read over the following fact sheets that you were given: Pain Booklet, Coughing and Deep Breathing, Blood Transfusion Information and Surgical Site Infection Prevention

## 2013-04-06 ENCOUNTER — Encounter (HOSPITAL_COMMUNITY)
Admission: RE | Admit: 2013-04-06 | Discharge: 2013-04-06 | Disposition: A | Discharge status: Home / Self Care | Payer: Medicare Other | Source: Ambulatory Visit | Attending: Neurological Surgery | Admitting: Neurological Surgery

## 2013-04-06 ENCOUNTER — Encounter (HOSPITAL_COMMUNITY): Payer: Self-pay

## 2013-04-06 DIAGNOSIS — Z01812 Encounter for preprocedural laboratory examination: Secondary | ICD-10-CM | POA: Insufficient documentation

## 2013-04-06 DIAGNOSIS — Z01818 Encounter for other preprocedural examination: Secondary | ICD-10-CM | POA: Insufficient documentation

## 2013-04-06 HISTORY — DX: Fibromyalgia: M79.7

## 2013-04-06 HISTORY — DX: Headache: R51

## 2013-04-06 LAB — BASIC METABOLIC PANEL
BUN: 17 mg/dL (ref 6–23)
Calcium: 9.5 mg/dL (ref 8.4–10.5)
Chloride: 94 mEq/L — ABNORMAL LOW (ref 96–112)
Creatinine, Ser: 0.67 mg/dL (ref 0.50–1.10)
GFR calc non Af Amer: >90 mL/min (ref >90)
Glucose, Bld: 466 mg/dL — ABNORMAL HIGH (ref 70–99)
Sodium: 131 mEq/L — ABNORMAL LOW (ref 135–145)

## 2013-04-06 LAB — CBC WITH DIFFERENTIAL/PLATELET
Basophils Absolute: 0 10*3/uL (ref 0.0–0.1)
Basophils Relative: 0 % (ref 0–1)
Eosinophils Absolute: 0.3 10*3/uL (ref 0.0–0.7)
MCH: 32 pg (ref 26.0–34.0)
MCHC: 35.5 g/dL (ref 30.0–36.0)
Neutrophils Relative %: 47 % (ref 43–77)
Platelets: 311 10*3/uL (ref 150–400)
RDW: 12.3 % (ref 11.5–15.5)

## 2013-04-06 LAB — PROTIME-INR
INR: 0.94 (ref 0.00–1.49)
Prothrombin Time: 12.4 seconds (ref 11.6–15.2)

## 2013-04-06 NOTE — Progress Notes (Signed)
Call to [facility], reported blood glucose captured on BMET today- of 466.  She will let Dr. Yetta Barre know & they will contact the pt.

## 2013-04-10 NOTE — Progress Notes (Signed)
Anesthesia Chart Review: Patient is a 62 year old female posted for L4-5 MAS, PLIF on 04/12/13 by Dr. Yetta Barre.  She is s/p L2-3 PLIF on 11/23/12. History includes former smoker, post-operative N/V, DM on insulin, COPD, depression, anxiety, asthma, GERD, arthritis, migraines, fibromyalgia, hysterectomy, heart murmur (not specified), cervical fusion. No PCP is listed.   EKG on 11/17/12 showed NSR.   CXR on 11/17/12 showed no acute disease.   Preoperative labs noted. Non-fasting glucose is 466. K+ 4.4. Na 131. WBC 10.1. H/H 15.1/42.5. A1C on 11/23/12 was 10.3.  Her significantly elevated glucose was already called to [facility] at Dr. Yetta Barre' office on 04/06/13 by her PAT RN.  Erie Noe will contact the patient and review with Dr. Yetta Barre.  Patient had a similar elevated glucose (470) during her PAT visit for her previous surgery in May 2014 (see my note from 516/14).  Her fasting glucose on the morning of surgery was 224, and the surgery proceeded as planned. Plans to proceed with depend on her fasting glucose control on the day of surgery.  Velna Ochs [facility]/Anesthesiology Phone [phone] 04/10/2013 10:14 AM

## 2013-04-11 MED ORDER — VANCOMYCIN HCL IN DEXTROSE 1-5 GM/200ML-% IV SOLN
1000.0000 mg | INTRAVENOUS | Status: AC
  Administered 2013-04-12: 1000 mg via INTRAVENOUS
  Filled 2013-04-11: qty 200

## 2013-04-11 NOTE — Progress Notes (Signed)
I spoke with Paula Hamilton and she said  MD office instructed her of new arrival of 0730.

## 2013-04-12 ENCOUNTER — Encounter (HOSPITAL_COMMUNITY): Payer: Medicare Other | Admitting: Vascular Surgery

## 2013-04-12 ENCOUNTER — Inpatient Hospital Stay (HOSPITAL_COMMUNITY): Payer: Medicare Other

## 2013-04-12 ENCOUNTER — Encounter (HOSPITAL_COMMUNITY): Payer: Self-pay | Admitting: Surgery

## 2013-04-12 ENCOUNTER — Inpatient Hospital Stay (HOSPITAL_COMMUNITY)
Admission: RE | Admit: 2013-04-12 | Discharge: 2013-04-13 | DRG: 460 | Disposition: A | Discharge status: Home / Self Care | Payer: Medicare Other | Source: Ambulatory Visit | Attending: Neurological Surgery | Admitting: Neurological Surgery

## 2013-04-12 ENCOUNTER — Encounter (HOSPITAL_COMMUNITY)
Admission: RE | Disposition: A | Discharge status: Home / Self Care | Payer: Self-pay | Source: Ambulatory Visit | Attending: Neurological Surgery

## 2013-04-12 ENCOUNTER — Inpatient Hospital Stay (HOSPITAL_COMMUNITY): Payer: Medicare Other | Admitting: Certified Registered Nurse Anesthetist

## 2013-04-12 DIAGNOSIS — Z880 Allergy status to penicillin: Secondary | ICD-10-CM

## 2013-04-12 DIAGNOSIS — Q762 Congenital spondylolisthesis: Principal | ICD-10-CM

## 2013-04-12 DIAGNOSIS — F329 Major depressive disorder, single episode, unspecified: Secondary | ICD-10-CM | POA: Diagnosis present

## 2013-04-12 DIAGNOSIS — Z9089 Acquired absence of other organs: Secondary | ICD-10-CM

## 2013-04-12 DIAGNOSIS — J449 Chronic obstructive pulmonary disease, unspecified: Secondary | ICD-10-CM | POA: Diagnosis present

## 2013-04-12 DIAGNOSIS — Z79899 Other long term (current) drug therapy: Secondary | ICD-10-CM

## 2013-04-12 DIAGNOSIS — Z87891 Personal history of nicotine dependence: Secondary | ICD-10-CM

## 2013-04-12 DIAGNOSIS — F3289 Other specified depressive episodes: Secondary | ICD-10-CM | POA: Diagnosis present

## 2013-04-12 DIAGNOSIS — Z881 Allergy status to other antibiotic agents status: Secondary | ICD-10-CM

## 2013-04-12 DIAGNOSIS — IMO0001 Reserved for inherently not codable concepts without codable children: Secondary | ICD-10-CM | POA: Diagnosis present

## 2013-04-12 DIAGNOSIS — Z9851 Tubal ligation status: Secondary | ICD-10-CM

## 2013-04-12 DIAGNOSIS — E119 Type 2 diabetes mellitus without complications: Secondary | ICD-10-CM | POA: Diagnosis present

## 2013-04-12 DIAGNOSIS — Z794 Long term (current) use of insulin: Secondary | ICD-10-CM

## 2013-04-12 DIAGNOSIS — F411 Generalized anxiety disorder: Secondary | ICD-10-CM | POA: Diagnosis present

## 2013-04-12 DIAGNOSIS — J4489 Other specified chronic obstructive pulmonary disease: Secondary | ICD-10-CM | POA: Diagnosis present

## 2013-04-12 DIAGNOSIS — Z888 Allergy status to other drugs, medicaments and biological substances status: Secondary | ICD-10-CM

## 2013-04-12 DIAGNOSIS — Z981 Arthrodesis status: Secondary | ICD-10-CM

## 2013-04-12 DIAGNOSIS — K219 Gastro-esophageal reflux disease without esophagitis: Secondary | ICD-10-CM | POA: Diagnosis present

## 2013-04-12 HISTORY — PX: MAXIMUM ACCESS (MAS)POSTERIOR LUMBAR INTERBODY FUSION (PLIF) 1 LEVEL: SHX6368

## 2013-04-12 LAB — GLUCOSE, CAPILLARY
Glucose-Capillary: 225 mg/dL — ABNORMAL HIGH (ref 70–99)
Glucose-Capillary: 194 mg/dL — ABNORMAL HIGH (ref 70–99)

## 2013-04-12 SURGERY — FOR MAXIMUM ACCESS (MAS) POSTERIOR LUMBAR INTERBODY FUSION (PLIF) 1 LEVEL
Anesthesia: General | Site: Back | Wound class: Clean

## 2013-04-12 MED ORDER — SODIUM CHLORIDE 0.9 % IJ SOLN
3.0000 mL | Freq: Two times a day (BID) | INTRAMUSCULAR | Status: DC
  Administered 2013-04-12 – 2013-04-13 (×2): 3 mL via INTRAVENOUS

## 2013-04-12 MED ORDER — INSULIN GLARGINE 100 UNITS/ML SOLOSTAR PEN: 30.0000 [IU] | PEN_INJECTOR | Freq: Every day | SUBCUTANEOUS | Status: DC

## 2013-04-12 MED ORDER — PREGABALIN 50 MG PO CAPS
75.0000 mg | ORAL_CAPSULE | Freq: Two times a day (BID) | ORAL | Status: DC
  Administered 2013-04-12 – 2013-04-13 (×2): 75 mg via ORAL
  Filled 2013-04-12 (×4): qty 1

## 2013-04-12 MED ORDER — FENTANYL CITRATE 0.05 MG/ML IJ SOLN
INTRAMUSCULAR | Status: DC | PRN
  Administered 2013-04-12: 25 ug via INTRAVENOUS
  Administered 2013-04-12: 50 ug via INTRAVENOUS
  Administered 2013-04-12: 125 ug via INTRAVENOUS

## 2013-04-12 MED ORDER — DEXAMETHASONE SODIUM PHOSPHATE 10 MG/ML IJ SOLN
10.0000 mg | INTRAMUSCULAR | Status: DC
  Filled 2013-04-12: qty 1

## 2013-04-12 MED ORDER — MIDAZOLAM HCL 5 MG/5ML IJ SOLN
INTRAMUSCULAR | Status: DC | PRN
  Administered 2013-04-12: 2 mg via INTRAVENOUS

## 2013-04-12 MED ORDER — ALBUTEROL SULFATE HFA 108 (90 BASE) MCG/ACT IN AERS
2.0000 | INHALATION_SPRAY | Freq: Four times a day (QID) | RESPIRATORY_TRACT | Status: DC | PRN
  Filled 2013-04-12: qty 6.7

## 2013-04-12 MED ORDER — MENTHOL 3 MG MT LOZG: 1.0000 | LOZENGE | OROMUCOSAL | Status: DC | PRN

## 2013-04-12 MED ORDER — SUCCINYLCHOLINE CHLORIDE 20 MG/ML IJ SOLN
INTRAMUSCULAR | Status: DC | PRN
  Administered 2013-04-12: 100 mg via INTRAVENOUS

## 2013-04-12 MED ORDER — MORPHINE SULFATE 2 MG/ML IJ SOLN: 1.0000 mg | INTRAMUSCULAR | Status: DC | PRN

## 2013-04-12 MED ORDER — ONDANSETRON HCL 4 MG/2ML IJ SOLN: 4.0000 mg | INTRAMUSCULAR | Status: DC | PRN

## 2013-04-12 MED ORDER — CEFAZOLIN SODIUM 1-5 GM-% IV SOLN
1.0000 g | Freq: Three times a day (TID) | INTRAVENOUS | Status: AC
  Administered 2013-04-12 – 2013-04-13 (×2): 1 g via INTRAVENOUS
  Filled 2013-04-12 (×2): qty 50

## 2013-04-12 MED ORDER — OXYCODONE HCL 5 MG PO TABS
5.0000 mg | ORAL_TABLET | Freq: Once | ORAL | Status: AC | PRN
  Administered 2013-04-12: 5 mg via ORAL

## 2013-04-12 MED ORDER — PANTOPRAZOLE SODIUM 40 MG PO TBEC
40.0000 mg | DELAYED_RELEASE_TABLET | Freq: Every day | ORAL | Status: DC
  Administered 2013-04-12 – 2013-04-13 (×2): 40 mg via ORAL
  Filled 2013-04-12 (×2): qty 1

## 2013-04-12 MED ORDER — OXYCODONE-ACETAMINOPHEN 5-325 MG PO TABS
1.0000 | ORAL_TABLET | ORAL | Status: DC | PRN
  Administered 2013-04-12 – 2013-04-13 (×5): 2 via ORAL
  Filled 2013-04-12 (×5): qty 2

## 2013-04-12 MED ORDER — MEPERIDINE HCL 25 MG/ML IJ SOLN: 6.2500 mg | INTRAMUSCULAR | Status: DC | PRN

## 2013-04-12 MED ORDER — ACETAMINOPHEN 650 MG RE SUPP: 650.0000 mg | RECTAL | Status: DC | PRN

## 2013-04-12 MED ORDER — INSULIN ASPART 100 UNIT/ML ~~LOC~~ SOLN
6.0000 [IU] | Freq: Every day | SUBCUTANEOUS | Status: DC
  Administered 2013-04-12: 6 [IU] via SUBCUTANEOUS

## 2013-04-12 MED ORDER — OXYCODONE HCL 5 MG/5ML PO SOLN: 5.0000 mg | Freq: Once | ORAL | Status: AC | PRN

## 2013-04-12 MED ORDER — LACTATED RINGERS IV SOLN
INTRAVENOUS | Status: DC | PRN
  Administered 2013-04-12 (×2): via INTRAVENOUS

## 2013-04-12 MED ORDER — 0.9 % SODIUM CHLORIDE (POUR BTL) OPTIME
TOPICAL | Status: DC | PRN
  Administered 2013-04-12: 1000 mL

## 2013-04-12 MED ORDER — SODIUM CHLORIDE 0.9 % IR SOLN
Status: DC | PRN
  Administered 2013-04-12: 11:00:00

## 2013-04-12 MED ORDER — HYDROMORPHONE HCL PF 1 MG/ML IJ SOLN
INTRAMUSCULAR | Status: AC
  Filled 2013-04-12: qty 1

## 2013-04-12 MED ORDER — ARTIFICIAL TEARS OP OINT
TOPICAL_OINTMENT | OPHTHALMIC | Status: DC | PRN
  Administered 2013-04-12: 1 via OPHTHALMIC

## 2013-04-12 MED ORDER — ACETAMINOPHEN 325 MG PO TABS
650.0000 mg | ORAL_TABLET | ORAL | Status: DC | PRN
  Administered 2013-04-13: 650 mg via ORAL
  Filled 2013-04-12: qty 2

## 2013-04-12 MED ORDER — DULOXETINE HCL 60 MG PO CPEP
60.0000 mg | ORAL_CAPSULE | Freq: Every day | ORAL | Status: DC
  Administered 2013-04-13: 60 mg via ORAL
  Filled 2013-04-12: qty 1

## 2013-04-12 MED ORDER — SODIUM CHLORIDE 0.9 % IV SOLN: 250.0000 mL | INTRAVENOUS | Status: DC

## 2013-04-12 MED ORDER — OXYCODONE HCL 5 MG PO TABS
ORAL_TABLET | ORAL | Status: AC
  Filled 2013-04-12: qty 1

## 2013-04-12 MED ORDER — ONDANSETRON HCL 4 MG/2ML IJ SOLN
INTRAMUSCULAR | Status: DC | PRN
  Administered 2013-04-12 (×2): 4 mg via INTRAMUSCULAR

## 2013-04-12 MED ORDER — METHOCARBAMOL 100 MG/ML IJ SOLN
500.0000 mg | Freq: Four times a day (QID) | INTRAVENOUS | Status: DC | PRN
  Filled 2013-04-12: qty 5

## 2013-04-12 MED ORDER — PROMETHAZINE HCL 25 MG/ML IJ SOLN: 6.2500 mg | INTRAMUSCULAR | Status: DC | PRN

## 2013-04-12 MED ORDER — THROMBIN 20000 UNITS EX SOLR
CUTANEOUS | Status: DC | PRN
  Administered 2013-04-12: 11:00:00 via TOPICAL

## 2013-04-12 MED ORDER — INSULIN ASPART 100 UNIT/ML FLEXPEN: 6.0000 [IU] | PEN_INJECTOR | Freq: Every day | SUBCUTANEOUS | Status: DC

## 2013-04-12 MED ORDER — LACTATED RINGERS IV SOLN
INTRAVENOUS | Status: DC
  Administered 2013-04-12: 09:00:00 via INTRAVENOUS

## 2013-04-12 MED ORDER — METHOCARBAMOL 500 MG PO TABS
500.0000 mg | ORAL_TABLET | Freq: Four times a day (QID) | ORAL | Status: DC | PRN
  Administered 2013-04-13 (×2): 500 mg via ORAL
  Filled 2013-04-12 (×2): qty 1

## 2013-04-12 MED ORDER — POTASSIUM CHLORIDE IN NACL 20-0.9 MEQ/L-% IV SOLN
INTRAVENOUS | Status: DC
  Filled 2013-04-12 (×4): qty 1000

## 2013-04-12 MED ORDER — HYDROMORPHONE HCL PF 1 MG/ML IJ SOLN
0.2500 mg | INTRAMUSCULAR | Status: DC | PRN
  Administered 2013-04-12 (×2): 0.5 mg via INTRAVENOUS

## 2013-04-12 MED ORDER — SODIUM CHLORIDE 0.9 % IJ SOLN: 3.0000 mL | INTRAMUSCULAR | Status: DC | PRN

## 2013-04-12 MED ORDER — BUPIVACAINE HCL (PF) 0.25 % IJ SOLN
INTRAMUSCULAR | Status: DC | PRN
  Administered 2013-04-12: 6 mL

## 2013-04-12 MED ORDER — PROPOFOL INFUSION 10 MG/ML OPTIME
INTRAVENOUS | Status: DC | PRN
  Administered 2013-04-12: 50 ug/kg/min via INTRAVENOUS

## 2013-04-12 MED ORDER — DOCUSATE SODIUM 100 MG PO CAPS
100.0000 mg | ORAL_CAPSULE | Freq: Every day | ORAL | Status: DC
Start: 2013-04-12 — End: 2013-04-13
  Administered 2013-04-13: 100 mg via ORAL
  Filled 2013-04-12: qty 1

## 2013-04-12 MED ORDER — INSULIN GLARGINE 100 UNIT/ML ~~LOC~~ SOLN
30.0000 [IU] | Freq: Every day | SUBCUTANEOUS | Status: DC
  Administered 2013-04-12: 30 [IU] via SUBCUTANEOUS
  Filled 2013-04-12 (×2): qty 0.3

## 2013-04-12 MED ORDER — MIDAZOLAM HCL 2 MG/2ML IJ SOLN: 0.5000 mg | Freq: Once | INTRAMUSCULAR | Status: DC | PRN

## 2013-04-12 MED ORDER — PHENYLEPHRINE HCL 10 MG/ML IJ SOLN
INTRAMUSCULAR | Status: DC | PRN
  Administered 2013-04-12: 20 ug via INTRAVENOUS
  Administered 2013-04-12: 60 ug via INTRAVENOUS
  Administered 2013-04-12: 20 ug via INTRAVENOUS

## 2013-04-12 MED ORDER — PHENOL 1.4 % MT LIQD: 1.0000 | OROMUCOSAL | Status: DC | PRN

## 2013-04-12 MED ORDER — SCOPOLAMINE 1 MG/3DAYS TD PT72
MEDICATED_PATCH | TRANSDERMAL | Status: AC
  Administered 2013-04-12: 1 via TRANSDERMAL
  Filled 2013-04-12: qty 1

## 2013-04-12 MED ORDER — PROPOFOL 10 MG/ML IV BOLUS
INTRAVENOUS | Status: DC | PRN
  Administered 2013-04-12: 100 mg via INTRAVENOUS

## 2013-04-12 SURGICAL SUPPLY — 68 items
APL SKNCLS STERI-STRIP NONHPOA (GAUZE/BANDAGES/DRESSINGS) ×1
BAG DECANTER FOR FLEXI CONT (MISCELLANEOUS) ×2 IMPLANT
BENZOIN TINCTURE PRP APPL 2/3 (GAUZE/BANDAGES/DRESSINGS) ×2 IMPLANT
BLADE SURG ROTATE 9660 (MISCELLANEOUS) IMPLANT
BONE MATRIX OSTEOCEL PRO MED (Bone Implant) ×1 IMPLANT
BUR MATCHSTICK NEURO 3.0 LAGG (BURR) ×2 IMPLANT
CAGE COROENT 11X9X23-8 (Cage) ×2 IMPLANT
CANISTER SUCTION 2500CC (MISCELLANEOUS) ×2 IMPLANT
CLIP NEUROVISION LG (CLIP) ×1 IMPLANT
CLOTH BEACON ORANGE TIMEOUT ST (SAFETY) ×2 IMPLANT
CONT SPEC 4OZ CLIKSEAL STRL BL (MISCELLANEOUS) ×4 IMPLANT
COVER BACK TABLE 24X17X13 BIG (DRAPES) IMPLANT
COVER TABLE BACK 60X90 (DRAPES) ×2 IMPLANT
DRAPE C-ARM 42X72 X-RAY (DRAPES) ×2 IMPLANT
DRAPE C-ARMOR (DRAPES) ×2 IMPLANT
DRAPE LAPAROTOMY 100X72X124 (DRAPES) ×2 IMPLANT
DRAPE POUCH INSTRU U-SHP 10X18 (DRAPES) ×2 IMPLANT
DRAPE SURG 17X23 STRL (DRAPES) ×2 IMPLANT
DRESSING TELFA 8X3 (GAUZE/BANDAGES/DRESSINGS) ×1 IMPLANT
DRSG OPSITE 4X5.5 SM (GAUZE/BANDAGES/DRESSINGS) ×2 IMPLANT
DRSG OPSITE POSTOP 4X6 (GAUZE/BANDAGES/DRESSINGS) ×1 IMPLANT
DURAPREP 26ML APPLICATOR (WOUND CARE) ×2 IMPLANT
ELECT REM PT RETURN 9FT ADLT (ELECTROSURGICAL) ×2
ELECTRODE REM PT RTRN 9FT ADLT (ELECTROSURGICAL) ×1 IMPLANT
EVACUATOR 1/8 PVC DRAIN (DRAIN) ×2 IMPLANT
GAUZE SPONGE 4X4 16PLY XRAY LF (GAUZE/BANDAGES/DRESSINGS) IMPLANT
GLOVE BIO SURGEON STRL SZ8 (GLOVE) ×4 IMPLANT
GLOVE BIOGEL PI IND STRL 7.0 (GLOVE) IMPLANT
GLOVE BIOGEL PI IND STRL 8 (GLOVE) IMPLANT
GLOVE BIOGEL PI INDICATOR 7.0 (GLOVE) ×2
GLOVE BIOGEL PI INDICATOR 8 (GLOVE) ×1
GLOVE ECLIPSE 7.5 STRL STRAW (GLOVE) ×1 IMPLANT
GLOVE SS BIOGEL STRL SZ 6.5 (GLOVE) IMPLANT
GLOVE SUPERSENSE BIOGEL SZ 6.5 (GLOVE) ×3
GOWN BRE IMP SLV AUR LG STRL (GOWN DISPOSABLE) ×1 IMPLANT
GOWN BRE IMP SLV AUR XL STRL (GOWN DISPOSABLE) ×4 IMPLANT
GOWN STRL REIN 2XL LVL4 (GOWN DISPOSABLE) IMPLANT
HEMOSTAT POWDER KIT SURGIFOAM (HEMOSTASIS) IMPLANT
KIT BASIN OR (CUSTOM PROCEDURE TRAY) ×2 IMPLANT
KIT NDL NVM5 EMG ELECT (KITS) IMPLANT
KIT NEEDLE NVM5 EMG ELECT (KITS) ×1 IMPLANT
KIT NEEDLE NVM5 EMG ELECTRODE (KITS) ×1
KIT ROOM TURNOVER OR (KITS) ×2 IMPLANT
MILL MEDIUM DISP (BLADE) ×1 IMPLANT
NDL HYPO 25X1 1.5 SAFETY (NEEDLE) ×1 IMPLANT
NEEDLE HYPO 25X1 1.5 SAFETY (NEEDLE) ×2 IMPLANT
NS IRRIG 1000ML POUR BTL (IV SOLUTION) ×2 IMPLANT
PACK LAMINECTOMY NEURO (CUSTOM PROCEDURE TRAY) ×2 IMPLANT
PAD ARMBOARD 7.5X6 YLW CONV (MISCELLANEOUS) ×6 IMPLANT
ROD 5.5X40MM (Rod) ×2 IMPLANT
SCREW LOCK (Screw) ×8 IMPLANT
SCREW LOCK FXNS SPNE MAS PL (Screw) IMPLANT
SCREW MAS PLIF 5.5X30 (Screw) ×2 IMPLANT
SCREW PAS PLIF 5X30 (Screw) IMPLANT
SCREW SHANK 5.0X30MM (Screw) ×2 IMPLANT
SCREW TULIP 5.5 (Screw) ×2 IMPLANT
SPONGE LAP 4X18 X RAY DECT (DISPOSABLE) IMPLANT
SPONGE SURGIFOAM ABS GEL 100 (HEMOSTASIS) ×2 IMPLANT
STRIP CLOSURE SKIN 1/2X4 (GAUZE/BANDAGES/DRESSINGS) ×3 IMPLANT
SUT VIC AB 0 CT1 18XCR BRD8 (SUTURE) ×1 IMPLANT
SUT VIC AB 0 CT1 8-18 (SUTURE) ×4
SUT VIC AB 2-0 CP2 18 (SUTURE) ×3 IMPLANT
SUT VIC AB 3-0 SH 8-18 (SUTURE) ×4 IMPLANT
SYR 20ML ECCENTRIC (SYRINGE) ×2 IMPLANT
TOWEL OR 17X24 6PK STRL BLUE (TOWEL DISPOSABLE) ×2 IMPLANT
TOWEL OR 17X26 10 PK STRL BLUE (TOWEL DISPOSABLE) ×2 IMPLANT
TRAY FOLEY CATH 14FRSI W/METER (CATHETERS) ×2 IMPLANT
WATER STERILE IRR 1000ML POUR (IV SOLUTION) ×2 IMPLANT

## 2013-04-12 NOTE — Preoperative (Signed)
Beta Blockers   Reason not to administer Beta Blockers:Not Applicable 

## 2013-04-12 NOTE — Op Note (Signed)
04/12/2013  1:05 PM  PATIENT:  Paula Hamilton  62 y.o. 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  POST-OPERATIVE DIAGNOSIS:  same  PROCEDURE:   1. Decompressive lumbar laminectomy L4-5 requiring more work than would be required for a simple exposure of the disk for PLIF in order to adequately decompress the neural elements and address the spinal stenosis 2. Posterior lumbar interbody fusion L4-5 using PEEK interbody cages packed with morcellized allograft and autograft 3. Posterior fixation L4-5 using cortical pedicle screws.   SURGEON:  Marikay Alar, MD  ASSISTANTS: Dr Newell Coral  ANESTHESIA:  General  EBL: 200 ml  Total I/O In: 1400 [I.V.:1400] Out: 350 [Urine:150; Blood:200]  BLOOD ADMINISTERED:none  DRAINS: Hemovac   INDICATION FOR PROCEDURE: presented with severe back and leg pain. Found to have stenosis with instability at L4-5 on Ct/myeo. Failed medical mgmt. I recommended PLIF L4-5. Patient understood the risks, benefits, and alternatives and potential outcomes and wished to proceed.  PROCEDURE DETAILS:  The patient was brought to the operating room. After induction of generalized endotracheal anesthesia the patient was rolled into the prone position on chest rolls and all pressure points were padded. The patient's lumbar region was cleaned and then prepped with DuraPrep and draped in the usual sterile fashion. Anesthesia was injected and then a dorsal midline incision was made and carried down to the lumbosacral fascia. The fascia was opened and the paraspinous musculature was taken down in a subperiosteal fashion to expose L4-5. A self-retaining retractor was placed. Intraoperative fluoroscopy confirmed my level, and I started with placement of the L4 cortical pedicle screws. The pedicle screw entry zones were identified utilizing surface landmarks and  AP and lateral fluoroscopy. I scored the cortex with the high-speed  drill and then used the hand drill and EMG monitoring to drill an upward and outward direction into the pedicle. I then tapped line to line, and the tap was also monitored. I then placed a 5-0 by 30 mm cortical pedicle screw into the pedicles of L4 bilaterally. I then turned my attention to the decompression and the spinous process was removed and complete lumbar laminectomies, hemi- facetectomies, and foraminotomies were performed at L4-5. The patient had significant spinal stenosis and this required more work than would be required for a simple exposure of the disc for posterior lumbar interbody fusion. Much more generous decompression was undertaken in order to adequately decompress the neural elements and address the patient's leg pain. The yellow ligament was removed to expose the underlying dura and nerve roots, and generous foraminotomies were performed to adequately decompress the neural elements. Both the exiting and traversing nerve roots were decompressed on both sides until a coronary dilator passed easily along the nerve roots. Once the decompression was complete, I turned my attention to the posterior lower lumbar interbody fusion. The epidural venous vasculature was coagulated and cut sharply. Disc space was incised and the initial discectomy was performed with pituitary rongeurs. The disc space was distracted with sequential distractors to a height of 12 mm. We then used a series of scrapers and shavers to prepare the endplates for fusion. The midline was prepared with Epstein curettes. Once the complete discectomy was finished, we packed an appropriate sized peek interbody cage with local autograft and morcellized allograft, gently retracted the nerve root, and tapped the cage into position at L4-5.  The midline between the cages was packed with morselized autograft and allograft. We then turned our attention to  the placement of the lower pedicle screws. The pedicle screw entry zones were identified  utilizing surface landmarks and fluoroscopy. I drilled into each pedicle utilizing the hand drill and EMG monitoring, and tapped each pedicle with the appropriate tap. We palpated with a ball probe to assure no break in the cortex. We then placed 5-0 by 30 mm pedicle screws into the pedicles bilaterally at L5. . We then placed lordotic rods into the multiaxial screw heads of the pedicle screws and locked these in position with the locking caps and anti-torque device. We then checked our construct with AP and lateral fluoroscopy. Irrigated with copious amounts of bacitracin-containing saline solution. Placed a medium Hemovac drain through separate stab incision. Inspected the nerve roots once again to assure adequate decompression, lined to the dura with Gelfoam, and closed the muscle and the fascia with 0 Vicryl. Closed the subcutaneous tissues with 2-0 Vicryl and subcuticular tissues with 3-0 Vicryl. The skin was closed with benzoin and Steri-Strips. Dressing was then applied, the patient was awakened from general anesthesia and transported to the recovery room in stable condition. At the end of the procedure all sponge, needle and instrument counts were correct.   PLAN OF CARE: Admit to inpatient   PATIENT DISPOSITION:  PACU - hemodynamically stable.   Delay start of Pharmacological VTE agent (>24hrs) due to surgical blood loss or risk of bleeding:  yes

## 2013-04-12 NOTE — Transfer of Care (Signed)
Immediate Anesthesia Transfer of Care Note  Patient: Paula Hamilton  Procedure(s) Performed: Procedure(s) with comments: Lumba four-five maximum access surgery Posterior Lumbar Interbody Fusion (N/A) - Lumba four-five maximum access surgery Posterior Lumbar Interbody Fusion  Patient Location: PACU  Anesthesia Type:General  Level of Consciousness: awake, alert  and oriented  Airway & Oxygen Therapy: Patient Spontanous Breathing and Patient connected to nasal cannula oxygen  Post-op Assessment: Report given to PACU RN and Post -op Vital signs reviewed and stable  Post vital signs: Reviewed and stable  Complications: No apparent anesthesia complications

## 2013-04-12 NOTE — H&P (Signed)
Subjective: Patient is a 62 y.o. female admitted for PLIF L4-5. Onset of symptoms was several months ago, gradually worsening since that time.  The pain is rated severe, and is located at the across the lower back and radiates to legs. The pain is described as aching and occurs all day. The symptoms have been progressive. Symptoms are exacerbated by exercise. MRI or CT showed spondylolisthesis with stenosis L4-5.   Past Medical History  Diagnosis Date  . PONV (postoperative nausea and vomiting)   . Shortness of breath   . Anxiety   . Mental disorder   . Depression   . Asthma   . COPD (chronic obstructive pulmonary disease)   . Diabetes mellitus without complication     fasting 270s  . GERD (gastroesophageal reflux disease)   . Hx of migraines   . Neuropathy   . Heart murmur     mentioned yrs. ago, never mentioned again.   Marland Kitchen Headache(784.0)     last migraine > 1 yr.   . Arthritis     listhesis, instability  . Fibromyalgia     Past Surgical History  Procedure Laterality Date  . Cervical fusion    . Appendectomy    . Tubal ligation    . Abdominal hysterectomy    . Back surgery      Prior to Admission medications   Medication Sig Start Date End Date Taking? Authorizing Provider  B COMPLEX-FOLIC ACID PO Take 1 tablet by mouth daily.   Yes Historical Provider, MD  Cholecalciferol (VITAMIN D) 1000 UNITS capsule Take 1,000 Units by mouth daily.   Yes Historical Provider, MD  DiphenhydrAMINE HCl (BENADRYL PO) Take 1 tablet by mouth at bedtime.   Yes Historical Provider, MD  docusate sodium (COLACE) 100 MG capsule Take 100 mg by mouth daily.   Yes Historical Provider, MD  DULoxetine (CYMBALTA) 60 MG capsule Take 60 mg by mouth daily.   Yes Historical Provider, MD  insulin aspart (NOVOLOG FLEXPEN) 100 unit/mL SOLN FlexPen Inject 6 Units into the skin daily. With biggest meal   Yes Historical Provider, MD  insulin glargine (LANTUS) 100 units/mL SOLN Inject 30 Units into the skin daily  at 10 pm.   Yes Historical Provider, MD  omeprazole (PRILOSEC) 20 MG capsule Take 20 mg by mouth daily as needed.   Yes Historical Provider, MD  oxycodone (OXY-IR) 5 MG capsule Take 5-10 mg by mouth every 6 (six) hours as needed.   Yes Historical Provider, MD  pregabalin (LYRICA) 75 MG capsule Take 75 mg by mouth 2 (two) times daily.   Yes Historical Provider, MD  rosuvastatin (CRESTOR) 10 MG tablet Take 10 mg by mouth daily.   Yes Historical Provider, MD  vitamin C (ASCORBIC ACID) 500 MG tablet Take 500 mg by mouth daily.   Yes Historical Provider, MD  albuterol (PROVENTIL HFA;VENTOLIN HFA) 108 (90 BASE) MCG/ACT inhaler Inhale 2 puffs into the lungs every 6 (six) hours as needed for wheezing.    Historical Provider, MD  oxyCODONE-acetaminophen (PERCOCET/ROXICET) 5-325 MG per tablet Take 1-2 tablets by mouth every 6 (six) hours as needed for pain. 11/25/12   Tia Alert, MD   Allergies  Allergen Reactions  . Codeine Rash  . Erythromycin Other (See Comments)    Stomach pains  . Morphine And Related Nausea And Vomiting  . Penicillins Rash    History  Substance Use Topics  . Smoking status: Former Games developer  . Smokeless tobacco: Former Careers information officer  date: 04/07/2011  . Alcohol Use: No    History reviewed. No pertinent family history.   Review of Systems  Positive ROS: neg  All other systems have been reviewed and were otherwise negative with the exception of those mentioned in the HPI and as above.  Objective: Vital signs in last 24 hours: Temp:  [98.6 F (37 C)] 98.6 F (37 C) (10/09 0825) Pulse Rate:  [79] 79 (10/09 0825) Resp:  [20] 20 (10/09 0825) BP: (114)/(41) 114/41 mmHg (10/09 0825) SpO2:  [100 %] 100 % (10/09 0825)  General Appearance: Alert, cooperative, no distress, appears stated age Head: Normocephalic, without obvious abnormality, atraumatic Eyes: PERRL, conjunctiva/corneas clear, EOM's intact    Neck: Supple, symmetrical, trachea midline Back: Symmetric, no  curvature, ROM normal, no CVA tenderness Lungs:  respirations unlabored Heart: Regular rate and rhythm Abdomen: Soft, non-tender Extremities: Extremities normal, atraumatic, no cyanosis or edema Pulses: 2+ and symmetric all extremities Skin: Skin color, texture, turgor normal, no rashes or lesions  NEUROLOGIC:   Mental status: Alert and oriented x4,  no aphasia, good attention span, fund of knowledge, and memory Motor Exam - grossly normal Sensory Exam - grossly normal Reflexes: 1+ Coordination - grossly normal Gait - grossly normal Balance - grossly normal Cranial Nerves: I: smell Not tested  II: visual acuity  OS: nl    OD: nl  II: visual fields Full to confrontation  II: pupils Equal, round, reactive to light  III,VII: ptosis None  III,IV,VI: extraocular muscles  Full ROM  V: mastication Normal  V: facial light touch sensation  Normal  V,VII: corneal reflex  Present  VII: facial muscle function - upper  Normal  VII: facial muscle function - lower Normal  VIII: hearing Not tested  IX: soft palate elevation  Normal  IX,X: gag reflex Present  XI: trapezius strength  5/5  XI: sternocleidomastoid strength 5/5  XI: neck flexion strength  5/5  XII: tongue strength  Normal    Data Review Lab Results  Component Value Date   WBC 10.1 04/06/2013   HGB 15.1* 04/06/2013   HCT 42.5 04/06/2013   MCV 90.0 04/06/2013   PLT 311 04/06/2013   Lab Results  Component Value Date   NA 131* 04/06/2013   K 4.4 04/06/2013   CL 94* 04/06/2013   CO2 27 04/06/2013   BUN 17 04/06/2013   CREATININE 0.67 04/06/2013   GLUCOSE 466* 04/06/2013   Lab Results  Component Value Date   INR 0.94 04/06/2013    Assessment/Plan: Patient admitted for PLIF L4-5. Patient has failed a reasonable attempt at conservative therapy.  I explained the condition and procedure to the patient and answered any questions.  Patient wishes to proceed with procedure as planned. Understands risks/ benefits and typical  outcomes of procedure.   Euline Kimbler S 04/12/2013 9:44 AM

## 2013-04-12 NOTE — Progress Notes (Signed)
Patient ID: Paula Hamilton, female   DOB: 1950/12/14, 62 y.o.   MRN: [id number] Doing well post-op. Good strength. NO leg pain or N/T/W.

## 2013-04-12 NOTE — Anesthesia Preprocedure Evaluation (Addendum)
Anesthesia Evaluation  Patient identified by MRN, date of birth, ID band Patient awake    Reviewed: Allergy & Precautions, H&P , NPO status , Patient's Chart, lab work & pertinent test results  History of Anesthesia Complications (+) PONV and history of anesthetic complications  Airway Mallampati: II TM Distance: >3 FB Neck ROM: Full    Dental  (+) Edentulous Upper and Edentulous Lower   Pulmonary asthma , COPD COPD inhaler, former smoker (quit '12),  breath sounds clear to auscultation  Pulmonary exam normal       Cardiovascular negative cardio ROS  Rhythm:Regular Rate:Normal     Neuro/Psych  Headaches, Anxiety Depression Chronic back pain: narcotics    GI/Hepatic Neg liver ROS, GERD-  Medicated and Controlled,  Endo/Other  diabetes (glu 225), Poorly Controlled, Type 2, Insulin Dependent  Renal/GU negative Renal ROS     Musculoskeletal  (+) Fibromyalgia -, narcotic dependent  Abdominal   Peds  Hematology   Anesthesia Other Findings   Reproductive/Obstetrics                          Anesthesia Physical Anesthesia Plan  ASA: III  Anesthesia Plan: General   Post-op Pain Management:    Induction: Intravenous  Airway Management Planned: Oral ETT  Additional Equipment:   Intra-op Plan:   Post-operative Plan: Extubation in OR  Informed Consent: I have reviewed the patients History and Physical, chart, labs and discussed the procedure including the risks, benefits and alternatives for the proposed anesthesia with the patient or authorized representative who has indicated his/her understanding and acceptance.     Plan Discussed with: Surgeon and CRNA  Anesthesia Plan Comments: (Plan routine monitors, GETA)        Anesthesia Quick Evaluation

## 2013-04-12 NOTE — Anesthesia Postprocedure Evaluation (Signed)
  Anesthesia Post-op Note  Patient: Paula Hamilton  Procedure(s) Performed: Procedure(s) with comments: Lumba four-five maximum access surgery Posterior Lumbar Interbody Fusion (N/A) - Lumba four-five maximum access surgery Posterior Lumbar Interbody Fusion  Patient Location: PACU  Anesthesia Type:General  Level of Consciousness: awake, alert , oriented and patient cooperative  Airway and Oxygen Therapy: Patient Spontanous Breathing and Patient connected to nasal cannula oxygen  Post-op Pain: mild  Post-op Assessment: Post-op Vital signs reviewed, Patient's Cardiovascular Status Stable, Respiratory Function Stable, Patent Airway, No signs of Nausea or vomiting and Pain level controlled  Post-op Vital Signs: Reviewed and stable  Complications: No apparent anesthesia complications

## 2013-04-12 NOTE — Anesthesia Procedure Notes (Signed)
Procedure Name: Intubation Date/Time: 04/12/2013 10:21 AM Performed by: Margaree Mackintosh Pre-anesthesia Checklist: Patient identified, Timeout performed, Emergency Drugs available, Suction available and Patient being monitored Patient Re-evaluated:Patient Re-evaluated prior to inductionOxygen Delivery Method: Circle system utilized Preoxygenation: Pre-oxygenation with 100% oxygen Intubation Type: IV induction Ventilation: Mask ventilation without difficulty Laryngoscope Size: Mac and 3 Grade View: Grade I Tube type: Oral Tube size: 7.0 mm Number of attempts: 1 Airway Equipment and Method: Stylet Placement Confirmation: ETT inserted through vocal cords under direct vision,  positive ETCO2 and breath sounds checked- equal and bilateral Secured at: 21 cm Tube secured with: Tape Dental Injury: Teeth and Oropharynx as per pre-operative assessment

## 2013-04-13 ENCOUNTER — Encounter (HOSPITAL_COMMUNITY): Payer: Self-pay | Admitting: Neurological Surgery

## 2013-04-13 LAB — GLUCOSE, CAPILLARY: Glucose-Capillary: 179 mg/dL — ABNORMAL HIGH (ref 70–99)

## 2013-04-13 MED ORDER — OXYCODONE HCL 5 MG PO CAPS: 5.0000 mg | ORAL_CAPSULE | Freq: Four times a day (QID) | ORAL | Status: DC | PRN

## 2013-04-13 NOTE — Progress Notes (Signed)
PT Cancellation Note  Patient Details Name: Paula Hamilton MRN: [id number] DOB: 09-17-1950   Cancelled Treatment:    Reason Eval/Treat Not Completed: PT screened, no needs identified, will sign off   Odai Wimmer 04/13/2013, 1:34 PM

## 2013-04-13 NOTE — Progress Notes (Signed)
Occupational Therapy Evaluation Patient Details Name: Paula Hamilton MRN: [id number] DOB: 06/08/1951 Today's Date: 04/13/2013 Time: 0940-1001 OT Time Calculation (min): 21 min  OT Assessment / Plan / Recommendation History of present illness   Decompressive lumbar laminectomy L4-5 requiring more work than would be required for a simple exposure of the disk for PLIF in order to adequately decompress the neural elements and address the spinal stenosis  2. Posterior lumbar interbody fusion L4-5 using PEEK interbody cages packed with morcellized allograft and autograft  3. Posterior fixation L4-5 using cortical pedicle screws    Clinical Impression   PTA, pt lived alone and was independent with ADL and mobility. Completed education regarding compensatory techniques and AE for ADL and mobility. Pt demonstrates understanding. Will have 24/7 S after D/C. REady for D/C when medically stable.    OT Assessment  Patient does not need any further OT services    Follow Up Recommendations  No OT follow up    Barriers to Discharge      Equipment Recommendations  3 in 1 bedside comode    Recommendations for Other Services    Frequency       Precautions / Restrictions Precautions Precautions: Back Precaution Booklet Issued: Yes (comment) Precaution Comments: Pt verbalizes 3 back precautions Required Braces or Orthoses: Spinal Brace Spinal Brace: Lumbar corset Restrictions Weight Bearing Restrictions: No   Pertinent Vitals/Pain no apparent distress     ADL  Grooming: Set up Where Assessed - Grooming: Supported sitting Upper Body Bathing: Set up Where Assessed - Upper Body Bathing: Supported sitting Lower Body Bathing: Minimal assistance Where Assessed - Lower Body Bathing: Unsupported sit to stand Upper Body Dressing: Set up Where Assessed - Upper Body Dressing: Unsupported sitting Lower Body Dressing: Minimal assistance Where Assessed - Lower Body Dressing: Unsupported sit to  stand Toilet Transfer: Supervision/safety Toilet Transfer Method: Other (comment) (ambulating) Acupuncturist: Comfort height toilet Toileting - Clothing Manipulation and Hygiene: Supervision/safety Where Assessed - Toileting Clothing Manipulation and Hygiene: Sit to stand from 3-in-1 or toilet Equipment Used: Back brace;Reacher Transfers/Ambulation Related to ADLs: Mod I ADL Comments: Educated pt on back precautions, useof DME, AE for ADL    OT Diagnosis:    OT Problem List:   OT Treatment Interventions:     OT Goals(Current goals can be found in the care plan section)    Visit Information  Last OT Received On: 04/13/13 Assistance Needed: +1       Prior Functioning     Home Living Family/patient expects to be discharged to:: Private residence Living Arrangements: Other relatives Available Help at Discharge: Family;Available 24 hours/day (22yo granddaughter lives next door. Dtr/granddtr do not work) Type of Home: House Home Access: Ramped entrance Home Layout: One level Home Equipment: Environmental consultant - 2 wheels Prior Function Level of Independence: Independent with assistive device(s);Needs assistance (Used cane at times) Comments: Pt needed assistance with tub/shower mobility sometimes, and has not driven for 6 months PTA Communication Communication: No difficulties Dominant Hand: Right         Vision/Perception Vision - History Baseline Vision: No visual deficits   Cognition  Cognition Arousal/Alertness: Awake/alert Behavior During Therapy: WFL for tasks assessed/performed Overall Cognitive Status: Within Functional Limits for tasks assessed    Extremity/Trunk Assessment Upper Extremity Assessment Upper Extremity Assessment: Overall WFL for tasks assessed Lower Extremity Assessment Lower Extremity Assessment: Generalized weakness Cervical / Trunk Assessment Cervical / Trunk Assessment: Other exceptions     Mobility Bed Mobility Bed Mobility:  Rolling Right;Right  Sidelying to Sit Rolling Right: 6: Modified independent (Device/Increase time) Right Sidelying to Sit: 6: Modified independent (Device/Increase time) Transfers Transfers: Sit to Stand;Stand to Sit Sit to Stand: 6: Modified independent (Device/Increase time) Stand to Sit: 6: Modified independent (Device/Increase time)     Exercise     Balance Balance Balance Assessed:  (WFL)   End of Session OT - End of Session Equipment Utilized During Treatment: Back brace Activity Tolerance: Patient tolerated treatment well Patient left: in chair;with call bell/phone within reach Nurse Communication: Mobility status  GO     Navneet Schmuck,HILLARY 04/13/2013, 11:19 AM Luisa Dago, OTR/L  [phone] 04/13/2013

## 2013-04-13 NOTE — Discharge Summary (Signed)
Physician Discharge Summary  Patient ID: Paula Hamilton MRN: [id number] DOB/AGE: Sep 10, 1950 62 y.o.  Admit date: 04/12/2013 Discharge date: 04/13/2013  Admission Diagnoses: Lumbar stenosis with spondylolisthesis L4-5    Discharge Diagnoses: Same   Discharged Condition: good  Hospital Course: The patient was admitted on 04/12/2013 and taken to the operating room where the patient underwent PLIF L4-5. The patient tolerated the procedure well and was taken to the recovery room and then to the floor in stable condition. The hospital course was routine. There were no complications. The wound remained clean dry and intact. Pt had appropriate back soreness. No complaints of leg pain or new N/T/W. The patient remained afebrile with stable vital signs, and tolerated a regular diet. The patient continued to increase activities, and pain was well controlled with oral pain medications.   Consults: None  Significant Diagnostic Studies:  Results for orders placed during the hospital encounter of 04/12/13  GLUCOSE, CAPILLARY      Result Value Range   Glucose-Capillary 225 (*) 70 - 99 mg/dL  GLUCOSE, CAPILLARY      Result Value Range   Glucose-Capillary 186 (*) 70 - 99 mg/dL  GLUCOSE, CAPILLARY      Result Value Range   Glucose-Capillary 194 (*) 70 - 99 mg/dL  GLUCOSE, CAPILLARY      Result Value Range   Glucose-Capillary 145 (*) 70 - 99 mg/dL  GLUCOSE, CAPILLARY      Result Value Range   Glucose-Capillary 131 (*) 70 - 99 mg/dL   Comment 1 Documented in Chart    GLUCOSE, CAPILLARY      Result Value Range   Glucose-Capillary 179 (*) 70 - 99 mg/dL    Dg Lumbar Spine 2-3 Views  04/12/2013   CLINICAL DATA:  Posterior lumbar interbody fusion.  EXAM: LUMBAR SPINE - 2-3 VIEW  COMPARISON:  It/ 14/2014.  FINDINGS: Discontinuous fusion has been performed at L4-L5 with L4-L5 diskectomy and interbody bone graft.  IMPRESSION: Interval L4-L5 discectomy with interbody bone graft.   Electronically  Signed   By: Andreas Newport M.D.   On: 04/12/2013 14:33    Antibiotics:  Anti-infectives   Start     Dose/Rate Route Frequency Ordered Stop   04/12/13 1900  ceFAZolin (ANCEF) IVPB 1 g/50 mL premix     1 g 100 mL/hr over 30 Minutes Intravenous Every 8 hours 04/12/13 1542 04/13/13 0302   04/12/13 1109  bacitracin 50,000 Units in sodium chloride irrigation 0.9 % 500 mL irrigation  Status:  Discontinued       As needed 04/12/13 1109 04/12/13 1250   04/12/13 0600  vancomycin (VANCOCIN) IVPB 1000 mg/200 mL premix     1,000 mg 200 mL/hr over 60 Minutes Intravenous On call to O.R. 04/11/13 1433 04/12/13 1017      Discharge Exam: Blood pressure 94/59, pulse 75, temperature 99.1 F (37.3 C), temperature source Oral, resp. rate 20, SpO2 96.00%. Neurologic: Grossly normal Incision clean dry and intact  Discharge Medications:     Medication List    STOP taking these medications       oxyCODONE-acetaminophen 5-325 MG per tablet  Commonly known as:  PERCOCET/ROXICET      TAKE these medications       albuterol 108 (90 BASE) MCG/ACT inhaler  Commonly known as:  PROVENTIL HFA;VENTOLIN HFA  Inhale 2 puffs into the lungs every 6 (six) hours as needed for wheezing.     B COMPLEX-FOLIC ACID PO  Take 1 tablet by mouth daily.  BENADRYL PO  Take 1 tablet by mouth at bedtime.     docusate sodium 100 MG capsule  Commonly known as:  COLACE  Take 100 mg by mouth daily.     DULoxetine 60 MG capsule  Commonly known as:  CYMBALTA  Take 60 mg by mouth daily.     insulin glargine 100 units/mL Soln  Commonly known as:  LANTUS  Inject 30 Units into the skin daily at 10 pm.     LYRICA 75 MG capsule  Generic drug:  pregabalin  Take 75 mg by mouth 2 (two) times daily.     NOVOLOG FLEXPEN 100 UNIT/ML Sopn FlexPen  Generic drug:  insulin aspart  Inject 6 Units into the skin daily. With biggest meal     omeprazole 20 MG capsule  Commonly known as:  PRILOSEC  Take 20 mg by mouth daily as  needed.     oxycodone 5 MG capsule  Commonly known as:  OXY-IR  Take 1-2 capsules (5-10 mg total) by mouth every 6 (six) hours as needed.     rosuvastatin 10 MG tablet  Commonly known as:  CRESTOR  Take 10 mg by mouth daily.     vitamin C 500 MG tablet  Commonly known as:  ASCORBIC ACID  Take 500 mg by mouth daily.     Vitamin D 1000 UNITS capsule  Take 1,000 Units by mouth daily.        Disposition: Home   Final Dx: PLIF L4-5      Discharge Orders   Future Orders Complete By Expires   Call MD for:  difficulty breathing, headache or visual disturbances  As directed    Call MD for:  persistant nausea and vomiting  As directed    Call MD for:  redness, tenderness, or signs of infection (pain, swelling, redness, odor or green/yellow discharge around incision site)  As directed    Call MD for:  severe uncontrolled pain  As directed    Call MD for:  temperature >100.4  As directed    Diet - low sodium heart healthy  As directed    Discharge instructions  As directed    Comments:     No strenuous activity, may shower normally, no bending or twisting or driving   Increase activity slowly  As directed    Remove dressing in 48 hours  As directed       Follow-up Information   Follow up with Tarynn Garling S, MD. Schedule an appointment as soon as possible for a visit in 2 weeks.   Specialty:  Neurosurgery   Contact information:   [address] [place] [address] [phone]        Signed: Tia Alert 04/13/2013, 1:34 PM

## 2013-04-13 NOTE — Progress Notes (Signed)
Pt. Alert and oriented,follows simple instructions, denies pain. Incision area without swelling, redness or S/S of infection. Voiding adequate clear yellow urine. Moving all extremities well and vitals stable and documented. Patient discharged home with family. Lumbar fusion surgery notes instructions given to patient and family member for home safety and precautions. Pt. and family stated understanding of instructions given. Pain meds given per Pt.'s request for pain and discomfort of ride home

## 2013-04-13 NOTE — Plan of Care (Signed)
Problem: Consults Goal: Diagnosis - Spinal Surgery Outcome: Completed/Met Date Met:  04/13/13 Thoraco/Lumbar Spine Fusion

## 2013-05-28 DIAGNOSIS — M48061 Spinal stenosis, lumbar region without neurogenic claudication: Secondary | ICD-10-CM | POA: Insufficient documentation

## 2013-12-21 DIAGNOSIS — M5417 Radiculopathy, lumbosacral region: Secondary | ICD-10-CM | POA: Insufficient documentation

## 2014-04-21 IMAGING — RF DG LUMBAR SPINE COMPLETE 4+V
1 series · 4 of 4 positions shown · non-contrast
Comparison: Lumbar spine radiographs dated 07/28/2012

CLINICAL DATA: PLIF

DG C-ARM 61-120 MIN,LUMBAR SPINE - COMPLETE 4+ VIEW
TECHNIQUE: C-arm fluoroscopic images were obtained
intraoperatively and submitted for postoperative interpretation.
Please see the performing provider's procedural report for the
fluoroscopy time utilized.
Fluoroscopy time:  48 seconds

[Series 1: run · 4 of 4 slices shown]
[im 1/4]
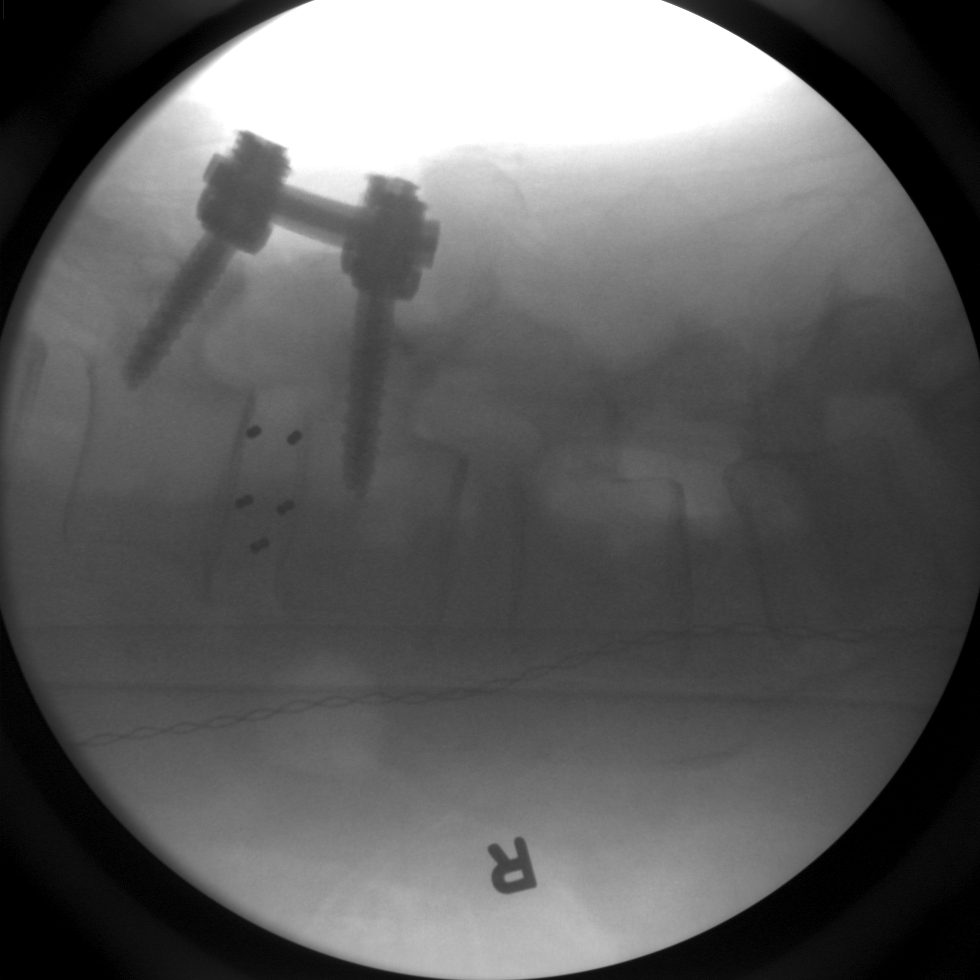
[im 2/4]
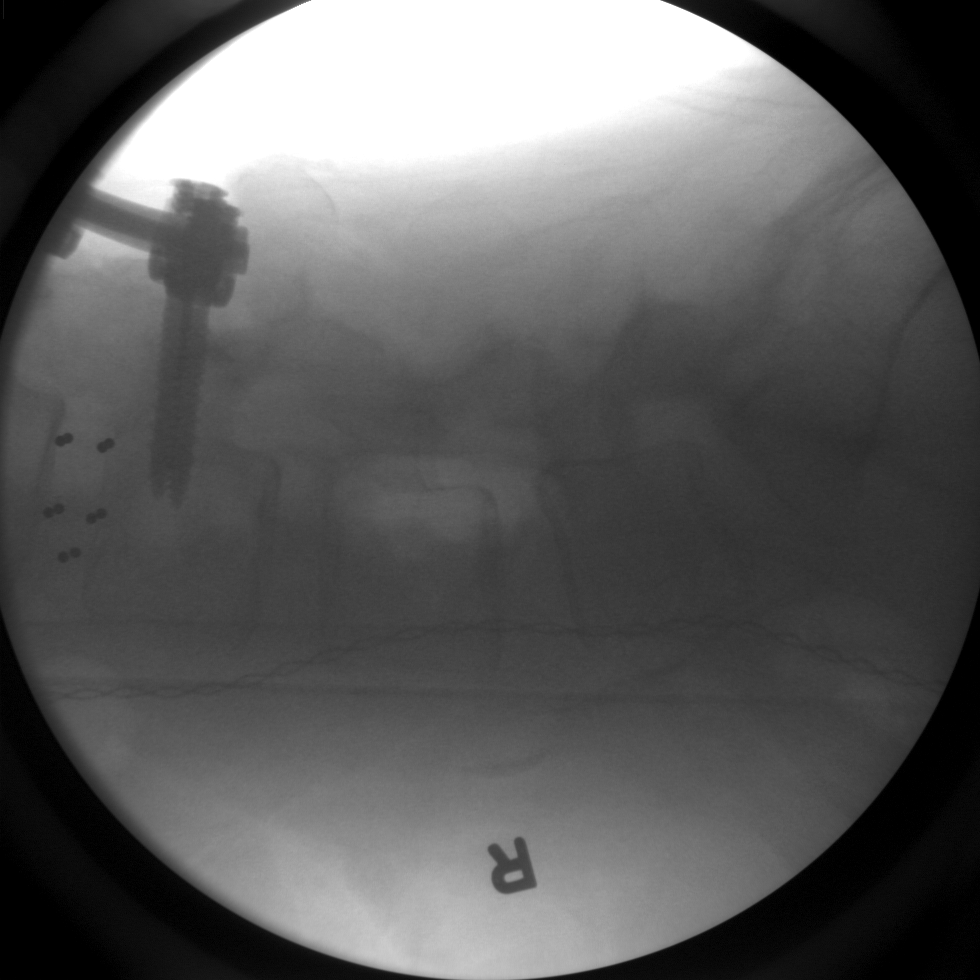
[im 3/4]
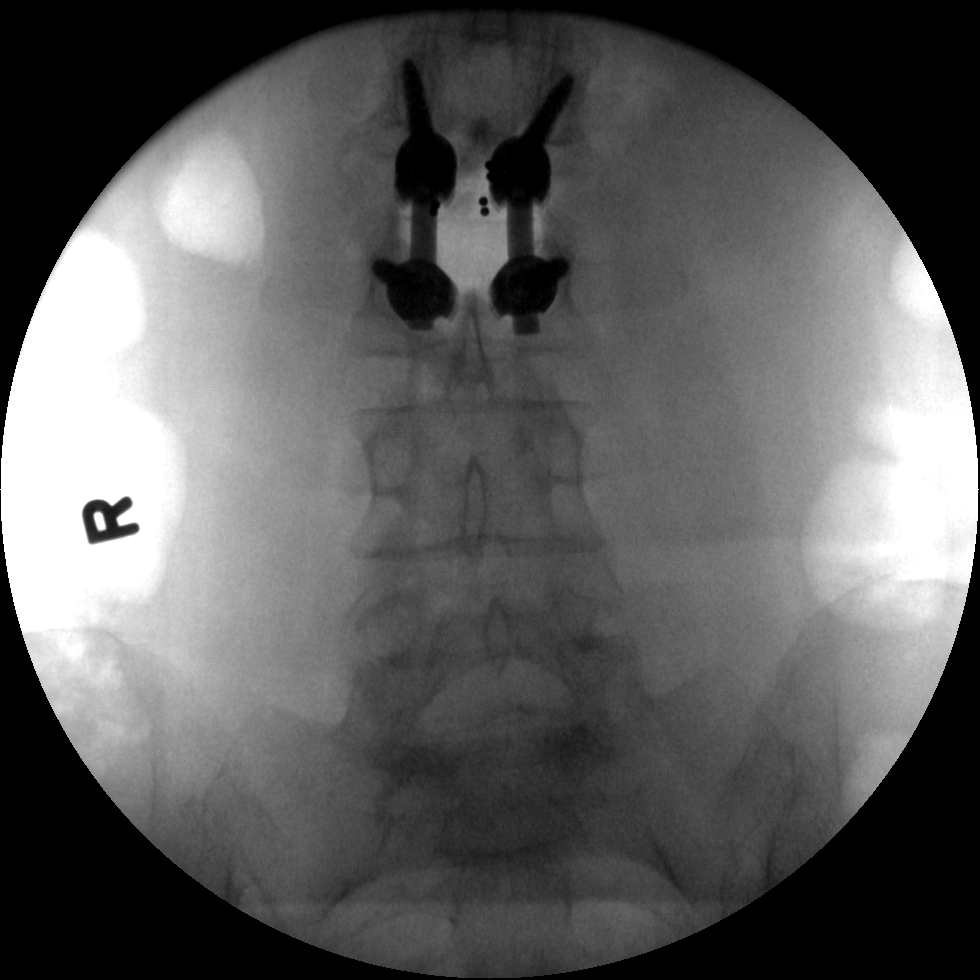
[im 4/4]
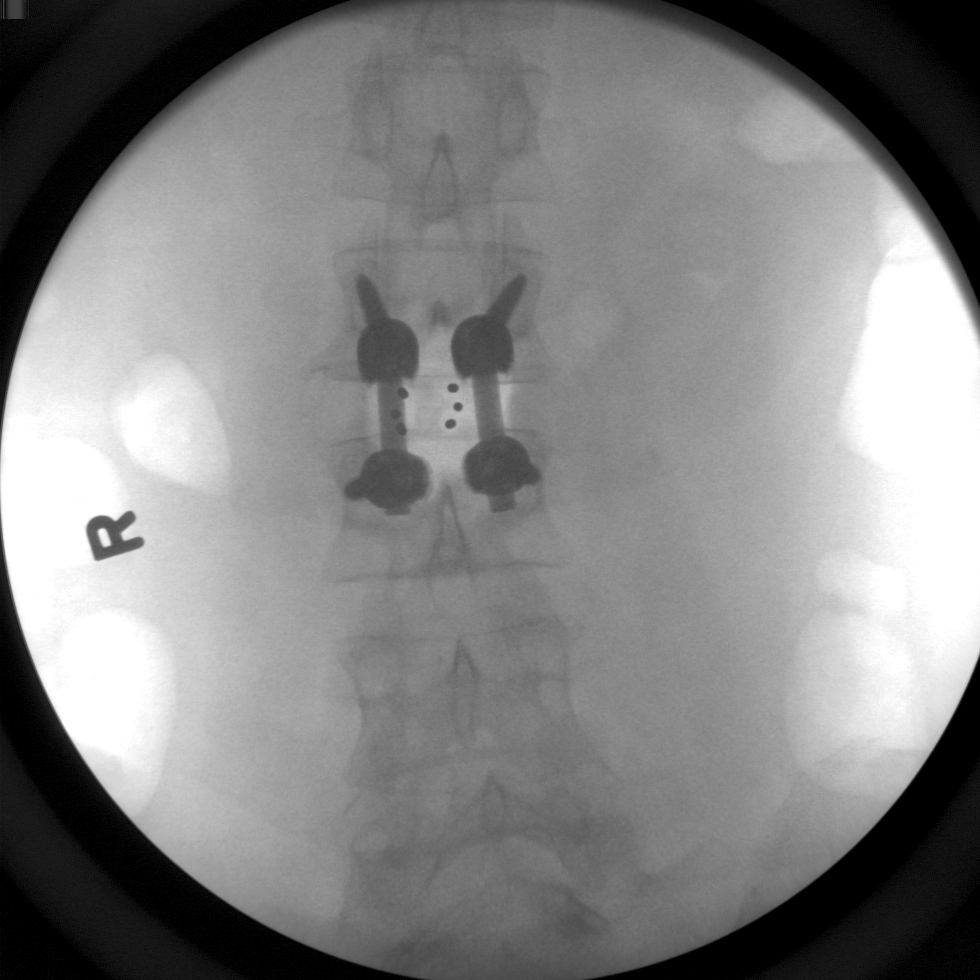

[4 of 4 positions shown; findings below may reference images not displayed]

FINDINGS: Two frontal and two lateral fluoroscopic spot images were
obtained during C2-3 PLIF.

Surgical hardware is in appropriate position.
IMPRESSION: Intraoperative fluoroscopic images during C2-3 PLIF.

## 2014-04-26 DIAGNOSIS — E1142 Type 2 diabetes mellitus with diabetic polyneuropathy: Secondary | ICD-10-CM | POA: Insufficient documentation

## 2014-04-29 ENCOUNTER — Other Ambulatory Visit: Payer: Self-pay | Admitting: Anesthesiology

## 2014-05-15 ENCOUNTER — Inpatient Hospital Stay (HOSPITAL_COMMUNITY): Admission: RE | Admit: 2014-05-15 | Payer: Medicare Other | Source: Ambulatory Visit

## 2014-05-20 NOTE — Pre-Procedure Instructions (Addendum)
Paula Hamilton  05/20/2014   Your procedure is scheduled on:  Friday, November 20.               [facility].               Surgery is scheduled for 7:30 AM   Call this number if you have problems the morning of surgery: [phone]   Remember:   Do not eat food or drink liquids after midnight Thursday, November 19.    Take these medicines the morning of surgery with A SIP OF WATER: DULoxetine (CYMBALTA),  OxyCODONE (OXYCONTIN),  pregabalin (LYRICA). Use Albuterol inhaler if needed and bring it to the hospital with you.            Take if needed:omeprazole (PRILOSEC), oxyCODONE-acetaminophen (PERCOCET/ROXICET).               Do not take medication for Diabetes the day of surgery.            Stop taking Vitamins.                Do not wear jewelry, make-up or nail polish.  Do not wear lotions, powders, or perfumes.  Do not bring valuables to the hospital.    [facility] is not responsible   for any belongings or valuables.              Contacts, dentures or bridgework may not be worn into surgery.  Leave suitcase in the car. After surgery it may be brought to your room.  For patients admitted to the hospital, discharge time is determined by your treatment team.               Patients discharged the day of surgery will not be allowed to drive home.     Special Instructions: [facility] - Preparing for Surgery  Before surgery, you can play an important role.  Because skin is not sterile, your skin needs to be as free of germs as possible.  You can reduce the number of germs on you skin by washing with CHG (chlorahexidine gluconate) soap before surgery.  CHG is an antiseptic cleaner which kills germs and bonds with the skin to continue killing germs even after washing.  Please DO NOT use if you have an allergy to CHG or antibacterial soaps.  If your skin becomes reddened/irritated stop using the CHG and inform your nurse when you arrive  at Short Stay.  Do not shave (including legs and underarms) for at least 48 hours prior to the first CHG shower.  You may shave your face.  Please follow these instructions carefully:   1.  Shower with CHG Soap the night before surgery and the                                morning of Surgery.  2.  If you choose to wash your hair, wash your hair first as usual with your       normal shampoo.  3.  After you shampoo, rinse your hair and body thoroughly to remove the                      Shampoo.  4.  Use CHG as you would any other liquid soap.  You can apply chg directly       to the skin and  wash gently with scrungie or a clean washcloth.  5.  Apply the CHG Soap to your body ONLY FROM THE NECK DOWN.        Do not use on open wounds or open sores.  Avoid contact with your eyes,       ears, mouth and genitals (private parts).  Wash genitals (private parts)       with your normal soap.  6.  Wash thoroughly, paying special attention to the area where your surgery        will be performed.  7.  Thoroughly rinse your body with warm water from the neck down.  8.  DO NOT shower/wash with your normal soap after using and rinsing off       the CHG Soap.  9.  Pat yourself dry with a clean towel.            10.  Wear clean pajamas.            11.  Place clean sheets on your bed the night of your first shower and do not        sleep with pets.  Day of Surgery  Do not apply any lotions/deoderants the morning of surgery.  Please wear clean clothes to the hospital/surgery center.      Please read over the following fact sheets that you were given: Pain Booklet, Coughing and Deep Breathing and Surgical Site Infection Prevention

## 2014-05-21 ENCOUNTER — Encounter (HOSPITAL_COMMUNITY)
Admission: RE | Admit: 2014-05-21 | Discharge: 2014-05-21 | Disposition: A | Discharge status: Home / Self Care | Payer: Medicare Other | Source: Ambulatory Visit | Attending: Anesthesiology | Admitting: Anesthesiology

## 2014-05-21 ENCOUNTER — Encounter (HOSPITAL_COMMUNITY): Payer: Self-pay

## 2014-05-21 DIAGNOSIS — M1990 Unspecified osteoarthritis, unspecified site: Secondary | ICD-10-CM | POA: Diagnosis not present

## 2014-05-21 DIAGNOSIS — Z981 Arthrodesis status: Secondary | ICD-10-CM | POA: Diagnosis not present

## 2014-05-21 DIAGNOSIS — J40 Bronchitis, not specified as acute or chronic: Secondary | ICD-10-CM | POA: Insufficient documentation

## 2014-05-21 DIAGNOSIS — F329 Major depressive disorder, single episode, unspecified: Secondary | ICD-10-CM | POA: Insufficient documentation

## 2014-05-21 DIAGNOSIS — R011 Cardiac murmur, unspecified: Secondary | ICD-10-CM | POA: Insufficient documentation

## 2014-05-21 DIAGNOSIS — F419 Anxiety disorder, unspecified: Secondary | ICD-10-CM | POA: Insufficient documentation

## 2014-05-21 DIAGNOSIS — Z01818 Encounter for other preprocedural examination: Secondary | ICD-10-CM | POA: Insufficient documentation

## 2014-05-21 DIAGNOSIS — G43909 Migraine, unspecified, not intractable, without status migrainosus: Secondary | ICD-10-CM | POA: Diagnosis not present

## 2014-05-21 DIAGNOSIS — K219 Gastro-esophageal reflux disease without esophagitis: Secondary | ICD-10-CM | POA: Insufficient documentation

## 2014-05-21 DIAGNOSIS — Z90710 Acquired absence of both cervix and uterus: Secondary | ICD-10-CM | POA: Diagnosis not present

## 2014-05-21 DIAGNOSIS — E119 Type 2 diabetes mellitus without complications: Secondary | ICD-10-CM | POA: Insufficient documentation

## 2014-05-21 DIAGNOSIS — Z794 Long term (current) use of insulin: Secondary | ICD-10-CM | POA: Diagnosis not present

## 2014-05-21 DIAGNOSIS — Z87891 Personal history of nicotine dependence: Secondary | ICD-10-CM | POA: Insufficient documentation

## 2014-05-21 DIAGNOSIS — J449 Chronic obstructive pulmonary disease, unspecified: Secondary | ICD-10-CM | POA: Insufficient documentation

## 2014-05-21 DIAGNOSIS — R05 Cough: Secondary | ICD-10-CM | POA: Diagnosis not present

## 2014-05-21 DIAGNOSIS — M797 Fibromyalgia: Secondary | ICD-10-CM | POA: Insufficient documentation

## 2014-05-21 DIAGNOSIS — J45909 Unspecified asthma, uncomplicated: Secondary | ICD-10-CM | POA: Insufficient documentation

## 2014-05-21 HISTORY — DX: Bronchitis, not specified as acute or chronic: J40

## 2014-05-21 HISTORY — DX: Pneumonia, unspecified organism: J18.9

## 2014-05-21 LAB — SURGICAL PCR SCREEN
MRSA, PCR: NEGATIVE
Staphylococcus aureus: NEGATIVE

## 2014-05-21 LAB — BASIC METABOLIC PANEL
ANION GAP: 11 (ref 5–15)
BUN: 15 mg/dL (ref 6–23)
CALCIUM: 9.1 mg/dL (ref 8.4–10.5)
CO2: 26 mEq/L (ref 19–32)
CREATININE: 0.74 mg/dL (ref 0.50–1.10)
Chloride: 99 mEq/L (ref 96–112)
GFR calc Af Amer: >90 mL/min (ref >90)
GFR, EST NON AFRICAN AMERICAN: 90 mL/min — AB (ref >90)
Glucose, Bld: 367 mg/dL — ABNORMAL HIGH (ref 70–99)
Potassium: 4 mEq/L (ref 3.7–5.3)
Sodium: 136 mEq/L — ABNORMAL LOW (ref 137–147)

## 2014-05-21 LAB — CBC
HEMATOCRIT: 39.2 % (ref 36.0–46.0)
Hemoglobin: 13.3 g/dL (ref 12.0–15.0)
MCH: 30.9 pg (ref 26.0–34.0)
MCHC: 33.9 g/dL (ref 30.0–36.0)
MCV: 91 fL (ref 78.0–100.0)
PLATELETS: 372 10*3/uL (ref 150–400)
RBC: 4.31 MIL/uL (ref 3.87–5.11)
RDW: 13.4 % (ref 11.5–15.5)
WBC: 11.2 10*3/uL — AB (ref 4.0–10.5)

## 2014-05-21 NOTE — Progress Notes (Signed)
Dr Ollen BowlHarkins office is aware of patient's elevated glucose, and patient have an appointment to see Dr Michel SanteeJason Van Eyk her PCP in AM.

## 2014-05-22 NOTE — Progress Notes (Signed)
Anesthesia Chart Review: Patient is a 63 year old female posted for lumbar spinal cord stimulator insertion by Dr. Ollen BowlHarkins.  Case was moved from 05/24/14 to 06/14/14 since her PAT visit yesterday.  History includes former smoker, post-operative N/V, DM on insulin, COPD, depression, anxiety, asthma, GERD, arthritis, migraines, fibromyalgia, hysterectomy, heart murmur (not specified; mentioned once but not later), cervical fusion, L4-5 MAS, PLIF 04/12/13, L2-3 PLIF on 11/23/12.  PCP is Dr. Leonia ReaderVan Eyk. [facility]; some notes in Care Everywhere).  EKG on 05/21/14 showed NSR.   CXR on 05/21/14:  There is minimal prominence of the pulmonary interstitial markings which may reflect acute bronchitic change. There is no evidence of pneumonia nor CHF. (Clinical data lists that she was recently treated with antibiotics for persistent cough.)  Preoperative labs noted. Non-fasting glucose is 367. Dr. Ollen BowlHarkins' office is aware. Patient is seeing Dr. Leonia ReaderVan Eyk this morning.    Surgery has now been moved out to 06/14/14.  PCP is evaluating patient today.  It sounds like she was recently treated for bronchitis.  Hopefully this and her hyperglycemia will be improved by then.  She will need new preoperative labs.  Her chart will go back to our schedulers.  Velna Ochsllison Regenia Erck, PA-C [facility]Anesthesiology Phone [phone] 05/22/2014 10:32 AM

## 2014-06-13 ENCOUNTER — Encounter (HOSPITAL_COMMUNITY): Payer: Self-pay | Admitting: Anesthesiology

## 2014-06-13 MED ORDER — VANCOMYCIN HCL IN DEXTROSE 1-5 GM/200ML-% IV SOLN
1000.0000 mg | INTRAVENOUS | Status: AC
  Administered 2014-06-14: 1000 mg via INTRAVENOUS
  Filled 2014-06-13: qty 200

## 2014-06-13 NOTE — H&P (Signed)
Paula Hamilton is an 63 y.o. female.   Chief Complaint: back pain with radiation into the lower extremities HPI: 63 year old right-handed woman, referred to me by Dr. Marion Downer for consideration of permanent spinal cord stimulator implantation.  Paula Hamilton is associated with our practice, as patient of my partner Dr. Ronnald Ramp; she has spondylolisthesis, resultant degenerative change and spinal stenosis for which she ultimately underwent went two operations, resulting in L2-3 and L4 L5 fusion.  Although she improved symptomatically after each operation, she still continues to have low back pain, with radiating symptoms into both lower extremities.  There is some distinct change below the knee in terms of the quality and character of her neuropathic pain, it seems likely consistent with diabetic neuropathy.  Over time, the patient has trialed physical therapy, interventional therapy, and medication management that has included muscle relaxants, anti-inflammatories, neuromodulatory agents and other analgesics including potent opioid analgesics.  She still continues to have fairly debilitating pain.  Under Dr. Emi Holes care, the patient was ultimately considered for spinal cord stimulator therapy.  She underwent that trial after appropriate psychological evaluation; in reference to Dr. Emi Holes notes, and by patient report today, she had between 61- 60% improvement in her overall pain symptoms.  Given this level of improvement, he referred her to me for permanent implantation.  Patient reports pain across her lower back, with radiating symptoms into both legs fairly equally. She was scheduled for this procedure about a month ago but had pneumonia; she has recovered from that illness and reports no further pulmonary problems, or other health issues.    Past Medical History  Diagnosis Date  . PONV (postoperative nausea and vomiting)   . Shortness of breath   . Anxiety   . Mental disorder   .  Depression   . Asthma   . COPD (chronic obstructive pulmonary disease)   . Diabetes mellitus without complication     fasting 270s  . GERD (gastroesophageal reflux disease)   . Hx of migraines   . Neuropathy   . Heart murmur     mentioned yrs. ago, never mentioned again.   Marland Kitchen Headache(784.0)     last migraine > 1 yr.   . Arthritis     listhesis, instability  . Fibromyalgia   . Bronchitis     just finished Z pack and is taking cough syrup with Codiene for cough  . Pneumonia     6 years ago    Past Surgical History  Procedure Laterality Date  . Cervical fusion    . Appendectomy    . Tubal ligation    . Abdominal hysterectomy    . Back surgery    . Maximum access (mas)posterior lumbar interbody fusion (plif) 1 level N/A 04/12/2013    Procedure: Lumba four-five maximum access surgery Posterior Lumbar Interbody Fusion;  Surgeon: Eustace Moore, MD;  Location: [facility];  Service: Neurosurgery;  Laterality: N/A;  Lumba four-five maximum access surgery Posterior Lumbar Interbody Fusion  . Colonoscopy w/ biopsies and polypectomy      benign    History reviewed. No pertinent family history. Social History:  reports that she quit smoking about 3 years ago. Her smoking use included Cigarettes. She has a 30 pack-year smoking history. She has never used smokeless tobacco. She reports that she does not drink alcohol or use illicit drugs.  Allergies:  Allergies  Allergen Reactions  . Codeine Rash  . Erythromycin Other (See Comments)    Stomach pains  .  Morphine And Related Nausea And Vomiting  . Penicillins Rash    Medications Prior to Admission  Medication Sig Dispense Refill  . albuterol (PROVENTIL HFA;VENTOLIN HFA) 108 (90 BASE) MCG/ACT inhaler Inhale 2 puffs into the lungs every 6 (six) hours as needed for wheezing.    . DiphenhydrAMINE HCl (BENADRYL PO) Take 1 tablet by mouth at bedtime.    . docusate sodium (COLACE) 100 MG capsule Take 100 mg by mouth daily.    . insulin  aspart (NOVOLOG FLEXPEN) 100 unit/mL SOLN FlexPen Inject 10 Units into the skin daily. With biggest meal    . insulin glargine (LANTUS) 100 units/mL SOLN Inject 44 Units into the skin at bedtime. At 8 pm    . omeprazole (PRILOSEC) 20 MG capsule Take 20 mg by mouth daily as needed.    . OxyCODONE (OXYCONTIN) 20 mg T12A 12 hr tablet Take 20 mg by mouth every 12 (twelve) hours.    Marland Kitchen oxyCODONE-acetaminophen (PERCOCET/ROXICET) 5-325 MG per tablet Take 1 tablet by mouth every 6 (six) hours as needed for severe pain.    . B COMPLEX-FOLIC ACID PO Take 1 tablet by mouth daily.    . Cholecalciferol (VITAMIN D) 1000 UNITS capsule Take 1,000 Units by mouth daily.    . DULoxetine (CYMBALTA) 60 MG capsule Take 60 mg by mouth daily.    Marland Kitchen oxycodone (OXY-IR) 5 MG capsule Take 1-2 capsules (5-10 mg total) by mouth every 6 (six) hours as needed. 80 capsule 0  . pregabalin (LYRICA) 75 MG capsule Take 75 mg by mouth 2 (two) times daily.    . rosuvastatin (CRESTOR) 10 MG tablet Take 10 mg by mouth daily.    . vitamin C (ASCORBIC ACID) 500 MG tablet Take 500 mg by mouth daily.      Results for orders placed or performed during the hospital encounter of 06/14/14 (from the past 48 hour(s))  CBC     Status: Abnormal   Collection Time: 06/14/14  7:42 AM  Result Value Ref Range   WBC 13.4 (H) 4.0 - 10.5 K/uL   RBC 4.74 3.87 - 5.11 MIL/uL   Hemoglobin 14.7 12.0 - 15.0 g/dL   HCT 42.9 36.0 - 46.0 %   MCV 90.5 78.0 - 100.0 fL   MCH 31.0 26.0 - 34.0 pg   MCHC 34.3 30.0 - 36.0 g/dL   RDW 13.0 11.5 - 15.5 %   Platelets 286 150 - 400 K/uL  Basic metabolic panel     Status: Abnormal   Collection Time: 06/14/14  7:42 AM  Result Value Ref Range   Sodium 140 137 - 147 mEq/L   Potassium 4.6 3.7 - 5.3 mEq/L   Chloride 98 96 - 112 mEq/L   CO2 30 19 - 32 mEq/L   Glucose, Bld 142 (H) 70 - 99 mg/dL   BUN 16 6 - 23 mg/dL   Creatinine, Ser 0.68 0.50 - 1.10 mg/dL   Calcium 9.6 8.4 - 10.5 mg/dL   GFR calc non Af Amer >90 >90  mL/min   GFR calc Af Amer >90 >90 mL/min    Comment: (NOTE) The eGFR has been calculated using the CKD EPI equation. This calculation has not been validated in all clinical situations. eGFR's persistently <90 mL/min signify possible Chronic Kidney Disease.    Anion gap 12 5 - 15  Glucose, capillary     Status: Abnormal   Collection Time: 06/14/14  7:51 AM  Result Value Ref Range   Glucose-Capillary 135 (H) 70 -  99 mg/dL   No results found.  Review of Systems  Constitutional: Negative.   HENT: Negative.   Eyes: Negative.   Respiratory: Negative.   Cardiovascular: Negative.   Gastrointestinal: Negative.   Genitourinary: Negative.   Musculoskeletal: Negative.   Skin: Negative.   Neurological: Negative.   Endo/Heme/Allergies: Negative.   Psychiatric/Behavioral: Negative.     Blood pressure 164/65, pulse 76, temperature 98.8 F (37.1 C), resp. rate 16, weight 64.456 kg (142 lb 1.6 oz), SpO2 97 %. Physical Exam  Constitutional: She is oriented to person, place, and time. She appears well-developed and well-nourished.  HENT:  Head: Normocephalic and atraumatic.  Eyes: EOM are normal. Pupils are equal, round, and reactive to light.  Neck: Normal range of motion.  Cardiovascular: Normal rate and regular rhythm.   Respiratory: Effort normal and breath sounds normal.  Neurological: She is alert and oriented to person, place, and time.  Skin: Skin is warm and dry.  Psychiatric: Her behavior is normal. Thought content normal.     Assessment/Plan ASSESSMENT: 1) Lumbar post-laminectomy syndrome 2) Lumbago 3) Lumbar radiculopathy 4) Chronic pain  PLAN: permanent SCS implant-[employer]  Ozzy Bohlken C 06/14/2014, 10:05 AM

## 2014-06-13 NOTE — Anesthesia Preprocedure Evaluation (Addendum)
Anesthesia Evaluation  Patient identified by MRN, date of birth, ID band Patient awake    Reviewed: Allergy & Precautions, H&P , NPO status , reviewed documented beta blocker date and time   History of Anesthesia Complications (+) PONV  Airway Mallampati: II   Neck ROM: Full    Dental  (+) Edentulous Upper, Edentulous Lower   Pulmonary asthma , COPDformer smoker,  breath sounds clear to auscultation        Cardiovascular Rhythm:Regular     Neuro/Psych Anxiety Depression    GI/Hepatic GERD-  Medicated,  Endo/Other  diabetes, Poorly Controlled, Type 2, Insulin Dependent  Renal/GU      Musculoskeletal   Abdominal (+)  Abdomen: soft.    Peds  Hematology   Anesthesia Other Findings   Reproductive/Obstetrics                            Anesthesia Physical Anesthesia Plan  ASA: III  Anesthesia Plan: MAC   Post-op Pain Management:    Induction: Intravenous  Airway Management Planned: Nasal Cannula  Additional Equipment:   Intra-op Plan:   Post-operative Plan:   Informed Consent: I have reviewed the patients History and Physical, chart, labs and discussed the procedure including the risks, benefits and alternatives for the proposed anesthesia with the patient or authorized representative who has indicated his/her understanding and acceptance.     Plan Discussed with:   Anesthesia Plan Comments: (Check AM labs)        Anesthesia Quick Evaluation

## 2014-06-14 ENCOUNTER — Ambulatory Visit (HOSPITAL_COMMUNITY): Payer: Medicare Other

## 2014-06-14 ENCOUNTER — Ambulatory Visit (HOSPITAL_COMMUNITY)
Admission: RE | Admit: 2014-06-14 | Discharge: 2014-06-14 | Disposition: A | Discharge status: Home / Self Care | Payer: Medicare Other | Source: Ambulatory Visit | Attending: Anesthesiology | Admitting: Anesthesiology

## 2014-06-14 ENCOUNTER — Encounter (HOSPITAL_COMMUNITY)
Admission: RE | Disposition: A | Discharge status: Home / Self Care | Payer: Self-pay | Source: Ambulatory Visit | Attending: Anesthesiology

## 2014-06-14 ENCOUNTER — Ambulatory Visit (HOSPITAL_COMMUNITY): Payer: Medicare Other | Admitting: Vascular Surgery

## 2014-06-14 ENCOUNTER — Encounter (HOSPITAL_COMMUNITY): Payer: Self-pay | Admitting: Certified Registered"

## 2014-06-14 ENCOUNTER — Ambulatory Visit (HOSPITAL_COMMUNITY): Payer: Medicare Other | Admitting: Anesthesiology

## 2014-06-14 DIAGNOSIS — M797 Fibromyalgia: Secondary | ICD-10-CM | POA: Insufficient documentation

## 2014-06-14 DIAGNOSIS — Z881 Allergy status to other antibiotic agents status: Secondary | ICD-10-CM | POA: Insufficient documentation

## 2014-06-14 DIAGNOSIS — F418 Other specified anxiety disorders: Secondary | ICD-10-CM | POA: Insufficient documentation

## 2014-06-14 DIAGNOSIS — Z888 Allergy status to other drugs, medicaments and biological substances status: Secondary | ICD-10-CM | POA: Diagnosis not present

## 2014-06-14 DIAGNOSIS — K219 Gastro-esophageal reflux disease without esophagitis: Secondary | ICD-10-CM | POA: Insufficient documentation

## 2014-06-14 DIAGNOSIS — Z880 Allergy status to penicillin: Secondary | ICD-10-CM | POA: Diagnosis not present

## 2014-06-14 DIAGNOSIS — M5416 Radiculopathy, lumbar region: Secondary | ICD-10-CM | POA: Insufficient documentation

## 2014-06-14 DIAGNOSIS — E1140 Type 2 diabetes mellitus with diabetic neuropathy, unspecified: Secondary | ICD-10-CM | POA: Insufficient documentation

## 2014-06-14 DIAGNOSIS — Z885 Allergy status to narcotic agent status: Secondary | ICD-10-CM | POA: Insufficient documentation

## 2014-06-14 DIAGNOSIS — M961 Postlaminectomy syndrome, not elsewhere classified: Secondary | ICD-10-CM | POA: Diagnosis not present

## 2014-06-14 DIAGNOSIS — G8929 Other chronic pain: Secondary | ICD-10-CM | POA: Insufficient documentation

## 2014-06-14 DIAGNOSIS — M4806 Spinal stenosis, lumbar region: Secondary | ICD-10-CM | POA: Insufficient documentation

## 2014-06-14 DIAGNOSIS — J45909 Unspecified asthma, uncomplicated: Secondary | ICD-10-CM | POA: Diagnosis not present

## 2014-06-14 DIAGNOSIS — F99 Mental disorder, not otherwise specified: Secondary | ICD-10-CM | POA: Insufficient documentation

## 2014-06-14 DIAGNOSIS — Z86010 Personal history of colonic polyps: Secondary | ICD-10-CM | POA: Insufficient documentation

## 2014-06-14 DIAGNOSIS — M4316 Spondylolisthesis, lumbar region: Secondary | ICD-10-CM | POA: Insufficient documentation

## 2014-06-14 DIAGNOSIS — M545 Low back pain, unspecified: Secondary | ICD-10-CM

## 2014-06-14 DIAGNOSIS — J449 Chronic obstructive pulmonary disease, unspecified: Secondary | ICD-10-CM | POA: Diagnosis not present

## 2014-06-14 DIAGNOSIS — Z87891 Personal history of nicotine dependence: Secondary | ICD-10-CM | POA: Diagnosis not present

## 2014-06-14 HISTORY — PX: SPINAL CORD STIMULATOR INSERTION: SHX5378

## 2014-06-14 LAB — CBC
HCT: 42.9 % (ref 36.0–46.0)
Hemoglobin: 14.7 g/dL (ref 12.0–15.0)
MCH: 31 pg (ref 26.0–34.0)
MCHC: 34.3 g/dL (ref 30.0–36.0)
MCV: 90.5 fL (ref 78.0–100.0)
Platelets: 286 10*3/uL (ref 150–400)
RBC: 4.74 MIL/uL (ref 3.87–5.11)
RDW: 13 % (ref 11.5–15.5)
WBC: 13.4 10*3/uL — AB (ref 4.0–10.5)

## 2014-06-14 LAB — BASIC METABOLIC PANEL
ANION GAP: 12 (ref 5–15)
BUN: 16 mg/dL (ref 6–23)
CHLORIDE: 98 meq/L (ref 96–112)
CO2: 30 mEq/L (ref 19–32)
Calcium: 9.6 mg/dL (ref 8.4–10.5)
Creatinine, Ser: 0.68 mg/dL (ref 0.50–1.10)
Glucose, Bld: 142 mg/dL — ABNORMAL HIGH (ref 70–99)
POTASSIUM: 4.6 meq/L (ref 3.7–5.3)
SODIUM: 140 meq/L (ref 137–147)

## 2014-06-14 LAB — GLUCOSE, CAPILLARY: Glucose-Capillary: 135 mg/dL — ABNORMAL HIGH (ref 70–99)

## 2014-06-14 SURGERY — INSERTION, SPINAL CORD STIMULATOR, LUMBAR
Anesthesia: Monitor Anesthesia Care | Site: Back

## 2014-06-14 MED ORDER — SCOPOLAMINE 1 MG/3DAYS TD PT72
1.0000 | MEDICATED_PATCH | TRANSDERMAL | Status: DC
  Administered 2014-06-14: 1.5 mg via TRANSDERMAL

## 2014-06-14 MED ORDER — LACTATED RINGERS IV SOLN
INTRAVENOUS | Status: DC | PRN
  Administered 2014-06-14: 11:00:00 via INTRAVENOUS

## 2014-06-14 MED ORDER — PROPOFOL INFUSION 10 MG/ML OPTIME
INTRAVENOUS | Status: DC | PRN
  Administered 2014-06-14: 75 ug/kg/min via INTRAVENOUS

## 2014-06-14 MED ORDER — LIDOCAINE HCL (CARDIAC) 20 MG/ML IV SOLN
INTRAVENOUS | Status: AC
  Filled 2014-06-14: qty 5

## 2014-06-14 MED ORDER — BUPIVACAINE-EPINEPHRINE (PF) 0.5% -1:200000 IJ SOLN
INTRAMUSCULAR | Status: DC | PRN
  Administered 2014-06-14: 10 mL via PERINEURAL

## 2014-06-14 MED ORDER — CLINDAMYCIN HCL 150 MG PO CAPS: 150.0000 mg | ORAL_CAPSULE | Freq: Three times a day (TID) | ORAL | Status: DC

## 2014-06-14 MED ORDER — BACITRACIN ZINC 500 UNIT/GM EX OINT
TOPICAL_OINTMENT | CUTANEOUS | Status: DC | PRN
  Administered 2014-06-14: 1 via TOPICAL

## 2014-06-14 MED ORDER — SCOPOLAMINE 1 MG/3DAYS TD PT72
MEDICATED_PATCH | TRANSDERMAL | Status: AC
  Administered 2014-06-14: 1 via TRANSDERMAL
  Filled 2014-06-14: qty 1

## 2014-06-14 MED ORDER — OXYCODONE HCL 10 MG PO TABS: 10.0000 mg | ORAL_TABLET | ORAL | Status: DC | PRN

## 2014-06-14 MED ORDER — MEPERIDINE HCL 25 MG/ML IJ SOLN: 6.2500 mg | INTRAMUSCULAR | Status: DC | PRN

## 2014-06-14 MED ORDER — 0.9 % SODIUM CHLORIDE (POUR BTL) OPTIME
TOPICAL | Status: DC | PRN
  Administered 2014-06-14: 1000 mL

## 2014-06-14 MED ORDER — FENTANYL CITRATE 0.05 MG/ML IJ SOLN
INTRAMUSCULAR | Status: AC
  Filled 2014-06-14: qty 5

## 2014-06-14 MED ORDER — FENTANYL CITRATE 0.05 MG/ML IJ SOLN
25.0000 ug | INTRAMUSCULAR | Status: DC | PRN
  Administered 2014-06-14 (×2): 50 ug via INTRAVENOUS

## 2014-06-14 MED ORDER — MIDAZOLAM HCL 5 MG/5ML IJ SOLN
INTRAMUSCULAR | Status: DC | PRN
  Administered 2014-06-14 (×2): 1 mg via INTRAVENOUS

## 2014-06-14 MED ORDER — MIDAZOLAM HCL 2 MG/2ML IJ SOLN
INTRAMUSCULAR | Status: AC
  Filled 2014-06-14: qty 2

## 2014-06-14 MED ORDER — LACTATED RINGERS IV SOLN
INTRAVENOUS | Status: DC
  Administered 2014-06-14: 08:00:00 via INTRAVENOUS

## 2014-06-14 MED ORDER — FENTANYL CITRATE 0.05 MG/ML IJ SOLN
INTRAMUSCULAR | Status: DC | PRN
  Administered 2014-06-14 (×3): 25 ug via INTRAVENOUS
  Administered 2014-06-14: 50 ug via INTRAVENOUS

## 2014-06-14 MED ORDER — FENTANYL CITRATE 0.05 MG/ML IJ SOLN
INTRAMUSCULAR | Status: AC
  Filled 2014-06-14: qty 2

## 2014-06-14 MED ORDER — SODIUM CHLORIDE 0.9 % IR SOLN
Status: DC | PRN
  Administered 2014-06-14: 500 mL

## 2014-06-14 MED ORDER — PROMETHAZINE HCL 25 MG/ML IJ SOLN: 6.2500 mg | INTRAMUSCULAR | Status: DC | PRN

## 2014-06-14 SURGICAL SUPPLY — 70 items
ADH SKN CLS APL DERMABOND .7 (GAUZE/BANDAGES/DRESSINGS) ×1
ANCH LD SWIFT-LCK ×2 IMPLANT
ANCHOR SWIFT LOCK ×2 IMPLANT
APL SKNCLS STERI-STRIP NONHPOA (GAUZE/BANDAGES/DRESSINGS)
BAG DECANTER FOR FLEXI CONT (MISCELLANEOUS) ×2 IMPLANT
BENZOIN TINCTURE PRP APPL 2/3 (GAUZE/BANDAGES/DRESSINGS) IMPLANT
BINDER ABDOMINAL 12 ML 46-62 (SOFTGOODS) ×2 IMPLANT
BLADE 10 SAFETY STRL DISP (BLADE) ×2 IMPLANT
BLADE SURG ROTATE 9660 (MISCELLANEOUS) IMPLANT
CHLORAPREP W/TINT 26ML (MISCELLANEOUS) ×2 IMPLANT
CONT SPEC 4OZ CLIKSEAL STRL BL (MISCELLANEOUS) ×2 IMPLANT
DERMABOND ADVANCED (GAUZE/BANDAGES/DRESSINGS) ×1
DERMABOND ADVANCED .7 DNX12 (GAUZE/BANDAGES/DRESSINGS) ×1 IMPLANT
DRAPE C-ARM 42X72 X-RAY (DRAPES) ×2 IMPLANT
DRAPE C-ARMOR (DRAPES) ×2 IMPLANT
DRAPE CAMERA VIDEO/LASER (DRAPES) ×1 IMPLANT
DRAPE LAPAROTOMY 100X72X124 (DRAPES) ×2 IMPLANT
DRAPE POUCH INSTRU U-SHP 10X18 (DRAPES) ×2 IMPLANT
DRAPE SURG 17X23 STRL (DRAPES) ×2 IMPLANT
DRESSING TELFA 8X3 (GAUZE/BANDAGES/DRESSINGS) IMPLANT
DRSG OPSITE POSTOP 3X4 (GAUZE/BANDAGES/DRESSINGS) ×1 IMPLANT
DRSG OPSITE POSTOP 4X6 (GAUZE/BANDAGES/DRESSINGS) ×1 IMPLANT
ELECT REM PT RETURN 9FT ADLT (ELECTROSURGICAL) ×2
ELECTRODE REM PT RTRN 9FT ADLT (ELECTROSURGICAL) ×1 IMPLANT
GAUZE SPONGE 4X4 16PLY XRAY LF (GAUZE/BANDAGES/DRESSINGS) ×2 IMPLANT
GLOVE BIOGEL PI IND STRL 7.5 (GLOVE) ×1 IMPLANT
GLOVE BIOGEL PI INDICATOR 7.5 (GLOVE) ×1
GLOVE ECLIPSE 7.5 STRL STRAW (GLOVE) ×2 IMPLANT
GLOVE EXAM NITRILE LRG STRL (GLOVE) IMPLANT
GLOVE EXAM NITRILE MD LF STRL (GLOVE) IMPLANT
GLOVE EXAM NITRILE XL STR (GLOVE) IMPLANT
GLOVE EXAM NITRILE XS STR PU (GLOVE) IMPLANT
GLOVE INDICATOR 7.5 STRL GRN (GLOVE) ×1 IMPLANT
GLOVE INDICATOR 8.0 STRL GRN (GLOVE) ×1 IMPLANT
GLOVE SURG SS PI 7.0 STRL IVOR (GLOVE) ×2 IMPLANT
GOWN STRL REUS W/ TWL LRG LVL3 (GOWN DISPOSABLE) IMPLANT
GOWN STRL REUS W/ TWL XL LVL3 (GOWN DISPOSABLE) IMPLANT
GOWN STRL REUS W/TWL 2XL LVL3 (GOWN DISPOSABLE) IMPLANT
GOWN STRL REUS W/TWL LRG LVL3 (GOWN DISPOSABLE) ×4
GOWN STRL REUS W/TWL XL LVL3 (GOWN DISPOSABLE)
IPG PROTEGE MRI (Neuro Prosthesis/Implant) ×1 IMPLANT
KIT BASIN OR (CUSTOM PROCEDURE TRAY) ×2 IMPLANT
KIT ROOM TURNOVER OR (KITS) ×2 IMPLANT
LEAD OCTRODE GEN 8CH 60CM (Lead) ×2 IMPLANT
MULTILEAD TRIAL CABLE ×1 IMPLANT
NDL 18GX1X1/2 (RX/OR ONLY) (NEEDLE) IMPLANT
NDL HYPO 25X1 1.5 SAFETY (NEEDLE) ×1 IMPLANT
NEEDLE 18GX1X1/2 (RX/OR ONLY) (NEEDLE) IMPLANT
NEEDLE HYPO 25X1 1.5 SAFETY (NEEDLE) ×2 IMPLANT
NS IRRIG 1000ML POUR BTL (IV SOLUTION) ×2 IMPLANT
PACK LAMINECTOMY NEURO (CUSTOM PROCEDURE TRAY) ×2 IMPLANT
PAD ARMBOARD 7.5X6 YLW CONV (MISCELLANEOUS) ×2 IMPLANT
PATIENT PROGRAMMER PROTEGE MRI (MISCELLANEOUS) ×1 IMPLANT
SPONGE LAP 4X18 X RAY DECT (DISPOSABLE) ×2 IMPLANT
SPONGE SURGIFOAM ABS GEL SZ50 (HEMOSTASIS) IMPLANT
STAPLER SKIN PROX WIDE 3.9 (STAPLE) ×2 IMPLANT
STRIP CLOSURE SKIN 1/2X4 (GAUZE/BANDAGES/DRESSINGS) IMPLANT
SUT MNCRL AB 4-0 PS2 18 (SUTURE) IMPLANT
SUT SILK 0 (SUTURE) ×2
SUT SILK 0 MO-6 18XCR BRD 8 (SUTURE) ×1 IMPLANT
SUT SILK 0 TIES 10X30 (SUTURE) IMPLANT
SUT SILK 2 0 TIES 10X30 (SUTURE) IMPLANT
SUT VIC AB 2-0 CP2 18 (SUTURE) ×4 IMPLANT
SYR EPIDURAL 5ML GLASS (SYRINGE) ×1 IMPLANT
SYRINGE 10CC LL (SYRINGE) IMPLANT
SYSTEM CHARGING PRODIGY (MISCELLANEOUS) ×1 IMPLANT
TOWEL OR 17X24 6PK STRL BLUE (TOWEL DISPOSABLE) ×2 IMPLANT
TOWEL OR 17X26 10 PK STRL BLUE (TOWEL DISPOSABLE) ×2 IMPLANT
WATER STERILE IRR 1000ML POUR (IV SOLUTION) ×2 IMPLANT
YANKAUER SUCT BULB TIP NO VENT (SUCTIONS) ×2 IMPLANT

## 2014-06-14 NOTE — Op Note (Signed)
PREOP DX: 1) lumbago  2) lumbar radiculopathy  3) lumbar post-laminectomy syndrome  4) chronic pain  POSTOP DX: 1) lumbago  2) lumbar radiculopathy  3) lumbar post-laminectomy syndrome  4) chronic pain PROCEDURES PERFORMED:1) intraop fluoro 2) placement of 2 16 contact [employer] Infinion leads 3) placement of Spectra SCS generator  SURGEON:Natividad Schlosser  ASSISTANT: NONE  ANESTHESIA: MAC  EBL: <20cc  DESCRIPTION OF PROCEDURE: After a discussion of risks, benefits and alternatives, informed consent was obtained. The patient was taken to the OR, turned prone onto a Jackson table, all pressure points padded, SCD's placed, and an adequate plane of anesthesia induced. A timeout was taken to verify the correct patient, position, personnel, availability of appropriate equipment, and administration of perioperative antibiotics.  The thoracic and lumbar areas were widely prepped with chloraprep and draped into a sterile field. Fluoroscopy was used to plan a right paramedian incision at the L1-L2 levels, and an incision made with a 10 blade and carried down to the dorsolumbar fascia with the bovie and blunt dissection. Retractors were placed and a 14g [employer] tuohy needle placed into the epidural space at the T12-L1 interspace using biplanar fluoro and loss-of-resistance technique. The needle was aspirated without any return of fluid. A St. Jude 60 cm octrode lead, SN [id number] was introduced and under live AP fluoro advanced until the distal-most contact overlay the inferior aspect T7vertebral body shadow with the rest of the contacts distributed over the T8and T9 vertebral bodies in a position just right of anatomic midline. A second St. Jude octrode lead SN [id number] was placed just left of anatomic midline in the same levels using the same technique. The patient was  awakened and the leads tested; impedances were good, and the patient reported good coverage with amplitudes in the 3-7 mA range. 0 silk sutures were placed in the fascia adjacent to the needles. The needles and stylets were removed under fluoroscopy with no lead migration noted. Leads were then fixed to the fascia Swift-lock anchors;  repeat images were obtained to verify that there had been no lead migration.  The incision was inspected and hemostasis obtained with the bipolar cautery.  Attention was then turned to creation of a subcutaneous pocket. At the right flank, a 3 cm incision was made with a 10 blade and using the bovie and blunt dissection a pocket of size appropriate to place a SCS generator. The pocket was trialed, and found to be of adequate size. The pocket was inspected for hemostasis, which was found to be excellent. Using reverse seldinger technique, the leads were tunneled to the pocket site, and the leads inserted into the SCS generator. Impedances were checked, and all found to be excellent. The leads were then all fixed into position with a self-torquing wrench. The wiring was all carefully coiled, placed behind the generator and placed in the pocket.  Both incisions were copiously irrigated with bacitracin-containing irrigation. The lumbar incision was closed in 2 deep layers of interrupted 2-0 vicryl and the skin closed with staples. The pocket incision was closed with a deeper layer of 2-0 vicryl interrupted sutures, and the skin closed with staples. Sterile dressings were applied. Needle, sponge, and instrument counts were correct x2 at the end of the case.  The patient was then carefully awakened from anesthesia, turned supine, an abdominal binder placed, and the patient taken to the recovery room where he underwent complex spinal cord stimulator programming.  COMPLICATIONS: NONE  CONDITION: Stable throughout the course of the procedure  and immediately afterward  DISPOSITION:  discharge to home, with antibiotics and pain medicine. Discussed care with the patient and spouse. Followup in clinic will be scheduled in 10-14 days.

## 2014-06-14 NOTE — Anesthesia Postprocedure Evaluation (Signed)
  Anesthesia Post-op Note  Patient: Paula MichaelCarolyn M Hamilton  Procedure(s) Performed: Procedure(s): LUMBAR SPINAL CORD STIMULATOR INSERTION (N/A)  Patient Location: PACU  Anesthesia Type:General  Level of Consciousness: awake and alert   Airway and Oxygen Therapy: Patient Spontanous Breathing  Post-op Pain: mild  Post-op Assessment: Post-op Vital signs reviewed, Patient's Cardiovascular Status Stable, Respiratory Function Stable and Patent Airway  Post-op Vital Signs: Reviewed and stable  Last Vitals:  Filed Vitals:   06/14/14 1300  BP:   Pulse:   Temp: 36.4 C  Resp:     Complications: No apparent anesthesia complications

## 2014-06-14 NOTE — Discharge Instructions (Signed)
What to eat: ° °For your first meals, you should eat lightly; only small meals initially.  If you do not have nausea, you may eat larger meals.  Avoid spicy, greasy and heavy food.   ° °General Anesthesia, Adult, Care After  °Refer to this sheet in the next few weeks. These instructions provide you with information on caring for yourself after your procedure. Your health care provider may also give you more specific instructions. Your treatment has been planned according to current medical practices, but problems sometimes occur. Call your health care provider if you have any problems or questions after your procedure.  °WHAT TO EXPECT AFTER THE PROCEDURE  °After the procedure, it is typical to experience:  °Sleepiness.  °Nausea and vomiting. °HOME CARE INSTRUCTIONS  °For the first 24 hours after general anesthesia:  °Have a responsible person with you.  °Do not drive a car. If you are alone, do not take public transportation.  °Do not drink alcohol.  °Do not take medicine that has not been prescribed by your health care provider.  °Do not sign important papers or make important decisions.  °You may resume a normal diet and activities as directed by your health care provider.  °Change bandages (dressings) as directed.  °If you have questions or problems that seem related to general anesthesia, call the hospital and ask for the anesthetist or anesthesiologist on call. °SEEK MEDICAL CARE IF:  °You have nausea and vomiting that continue the day after anesthesia.  °You develop a rash. °SEEK IMMEDIATE MEDICAL CARE IF:  °You have difficulty breathing.  °You have chest pain.  °You have any allergic problems. °Document Released: 09/27/2000 Document Revised: 02/21/2013 Document Reviewed: 01/04/2013  °ExitCare® Patient Information ©2014 [employer].  ° ° °Laceration Care, Adult  ° ° °A laceration is a cut that goes through all layers of the skin. The cut goes into the tissue beneath the skin.  °HOME CARE  °For stitches  (sutures) or staples:  °Keep the cut clean and dry.  °If you have a bandage (dressing), change it at least once a day. Change the bandage if it gets wet or dirty, or as told by your doctor.  °Wash the cut with soap and water 2 times a day. Rinse the cut with water. Pat it dry with a clean towel.  °Put a thin layer of medicated cream on the cut as told by your doctor.  °You may shower after the first 24 hours. Do not soak the cut in water until the stitches are removed.  °Only take medicines as told by your doctor.  °Have your stitches or staples removed as told by your doctor. °For skin adhesive strips:  °Keep the cut clean and dry.  °Do not get the strips wet. You may take a bath, but be careful to keep the cut dry.  °If the cut gets wet, pat it dry with a clean towel.  °The strips will fall off on their own. Do not remove the strips that are still stuck to the cut. °For wound glue:  °You may shower or take baths. Do not soak or scrub the cut. Do not swim. Avoid heavy sweating until the glue falls off on its own. After a shower or bath, pat the cut dry with a clean towel.  °Do not put medicine on your cut until the glue falls off.  °If you have a bandage, do not put tape over the glue.  °Avoid lots of sunlight or tanning lamps until   the glue falls off. Put sunscreen on the cut for the first year to reduce your scar.  °The glue will fall off on its own. Do not pick at the glue. °You may need a tetanus shot if:  °You cannot remember when you had your last tetanus shot.  °You have never had a tetanus shot. °If you need a tetanus shot and you choose not to have one, you may get tetanus. Sickness from tetanus can be serious.  °GET HELP RIGHT AWAY IF:  °Your pain does not get better with medicine.  °Your arm, hand, leg, or foot loses feeling (numbness) or changes color.  °Your cut is bleeding.  °Your joint feels weak, or you cannot use your joint.  °You have painful lumps on your body.  °Your cut is red, puffy (swollen),  or painful.  °You have a red line on the skin near the cut.  °You have yellowish-white fluid (pus) coming from the cut.  °You have a fever.  °You have a bad smell coming from the cut or bandage.  °Your cut breaks open before or after stitches are removed.  °You notice something coming out of the cut, such as wood or glass.  °You cannot move a finger or toe. °MAKE SURE YOU:  °Understand these instructions.  °Will watch your condition.  °Will get help right away if you are not doing well or get worse. °Document Released: 12/08/2007 Document Revised: 09/13/2011 Document Reviewed: 12/15/2010  °ExitCare® Patient Information ©2014 [employer].  ° ° °

## 2014-06-14 NOTE — Transfer of Care (Signed)
Immediate Anesthesia Transfer of Care Note  Patient: Paula Hamilton  Procedure(s) Performed: Procedure(s): LUMBAR SPINAL CORD STIMULATOR INSERTION (N/A)  Patient Location: PACU  Anesthesia Type:MAC  Level of Consciousness: awake, alert  and oriented  Airway & Oxygen Therapy: Patient Spontanous Breathing and Patient connected to nasal cannula oxygen  Post-op Assessment: Report given to PACU RN, Post -op Vital signs reviewed and stable and Patient moving all extremities X 4  Post vital signs: Reviewed and stable  Complications: No apparent anesthesia complications

## 2014-06-14 NOTE — Progress Notes (Signed)
Pt stated bronchitis is much improved and she had a follow[facility].  Pt states Dr Ollen BowlHarkins is aware of results.  [facility] and am still waiting for results.  No cough or congestion noted.  Dr Berneice HeinrichManny aware and will evaluate upstairs.

## 2014-06-15 LAB — GLUCOSE, CAPILLARY: Glucose-Capillary: 159 mg/dL — ABNORMAL HIGH (ref 70–99)

## 2014-06-18 ENCOUNTER — Encounter (HOSPITAL_COMMUNITY): Payer: Self-pay | Admitting: Anesthesiology

## 2014-07-02 ENCOUNTER — Encounter (HOSPITAL_COMMUNITY): Payer: Self-pay | Admitting: Anesthesiology

## 2014-07-02 DIAGNOSIS — Z9889 Other specified postprocedural states: Secondary | ICD-10-CM | POA: Insufficient documentation

## 2014-07-14 IMAGING — CR DG MYELOGRAM LUMBAR
6 of 19 series · 6 of 19 positions shown · non-contrast
Comparison: none

CLINICAL DATA: Lumbar radiculopathy.  Little if any improvement
following lumbar spine operation November 23, 2012.  L2-L3 posterior
lumbar interbody fusion.
TECHNIQUE: Contiguous axial images were obtained through the lumbar
spine without infusion. Coronal, sagittal, and disc space
reconstructions were obtained of the axial image sets.

[[hospital]]
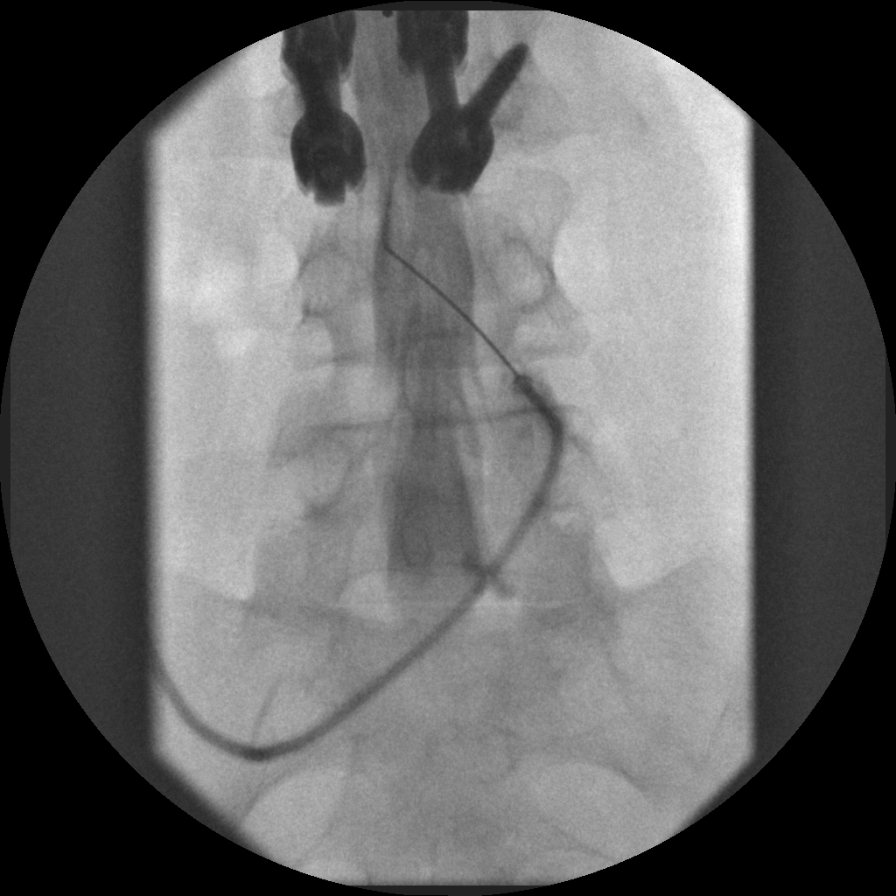

[myelogram  white (1 of 5)]
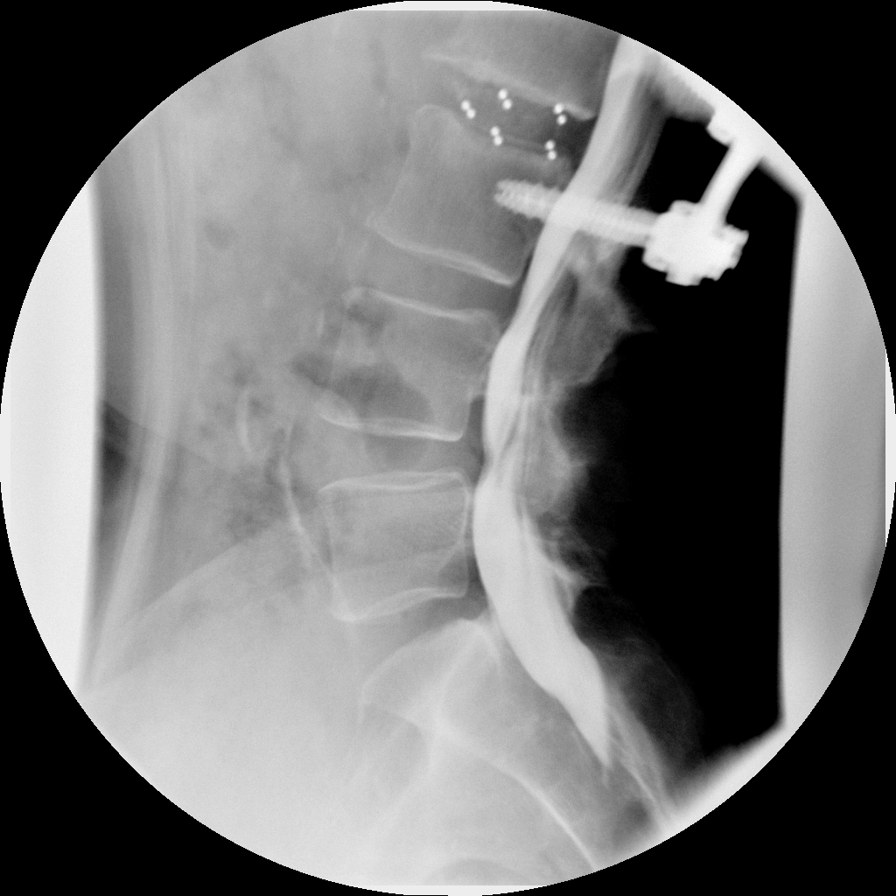

[myelogram  white (2 of 5)]
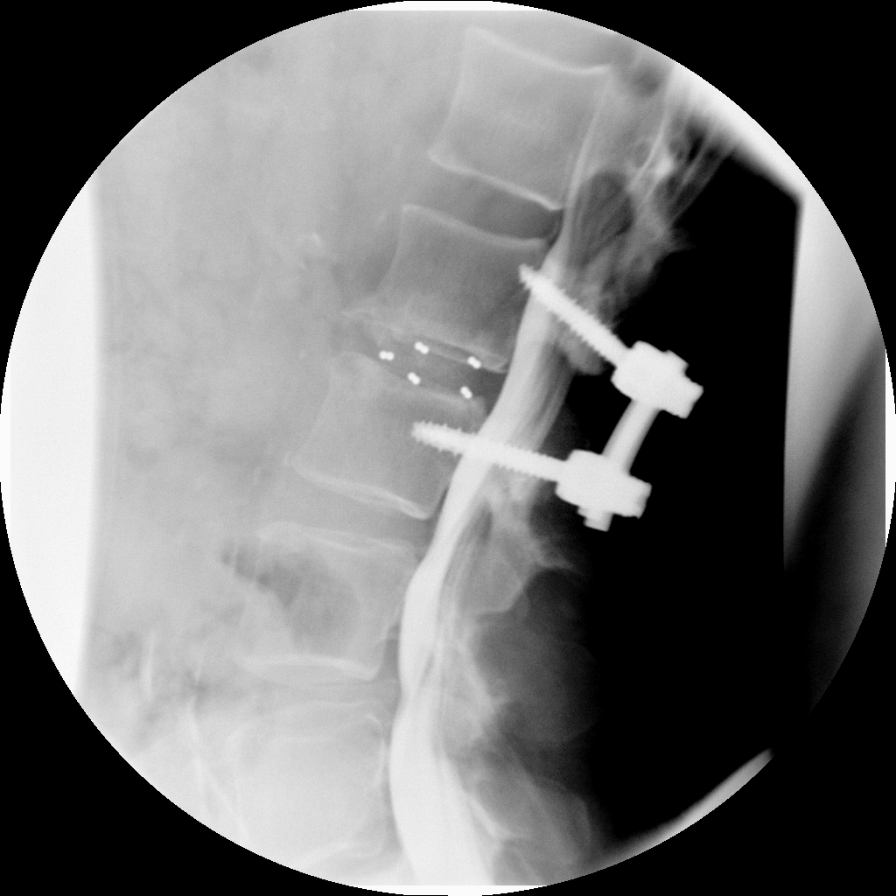

[myelogram  white (3 of 5)]
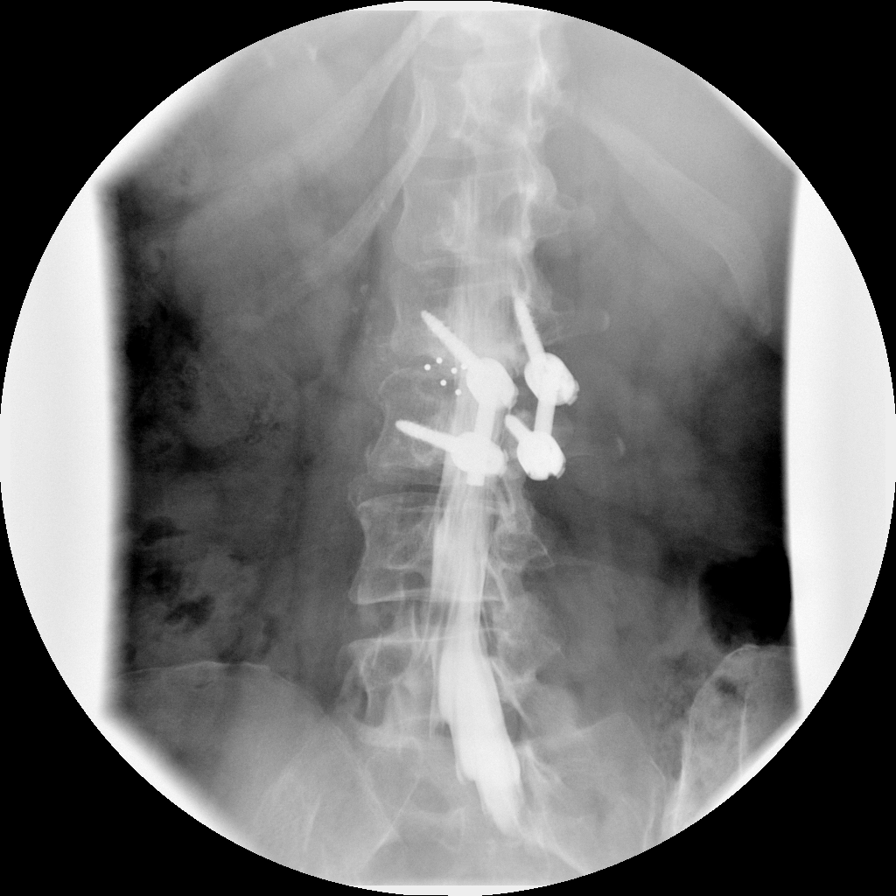

[myelogram  white (4 of 5)]
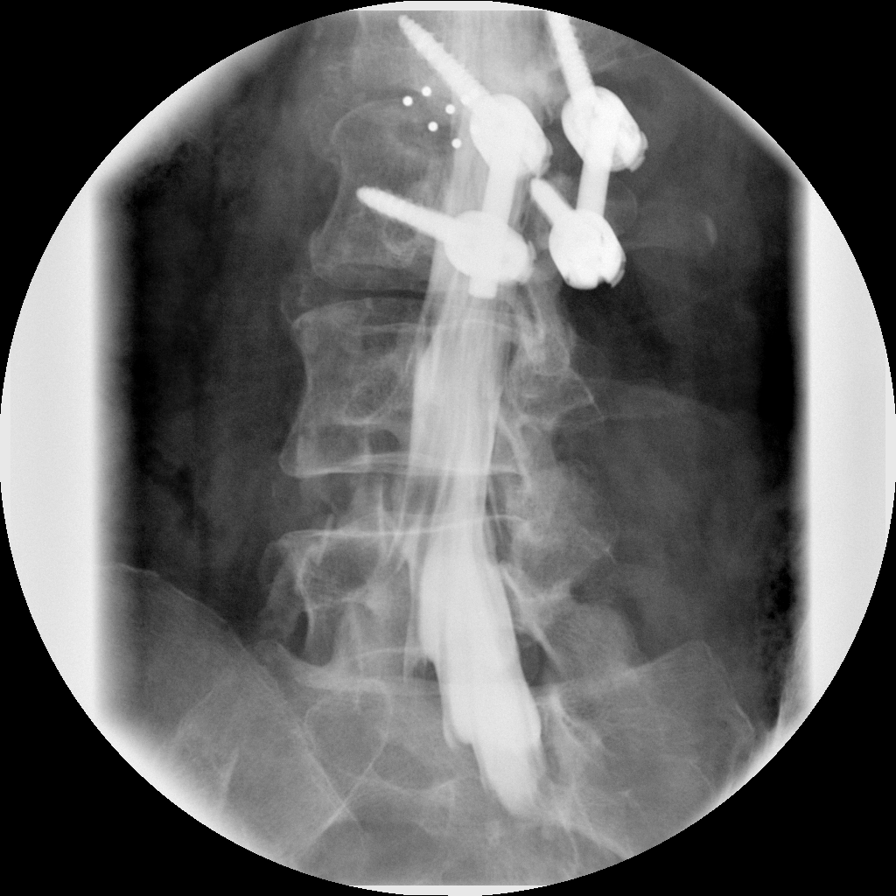

[myelogram  white (5 of 5)]
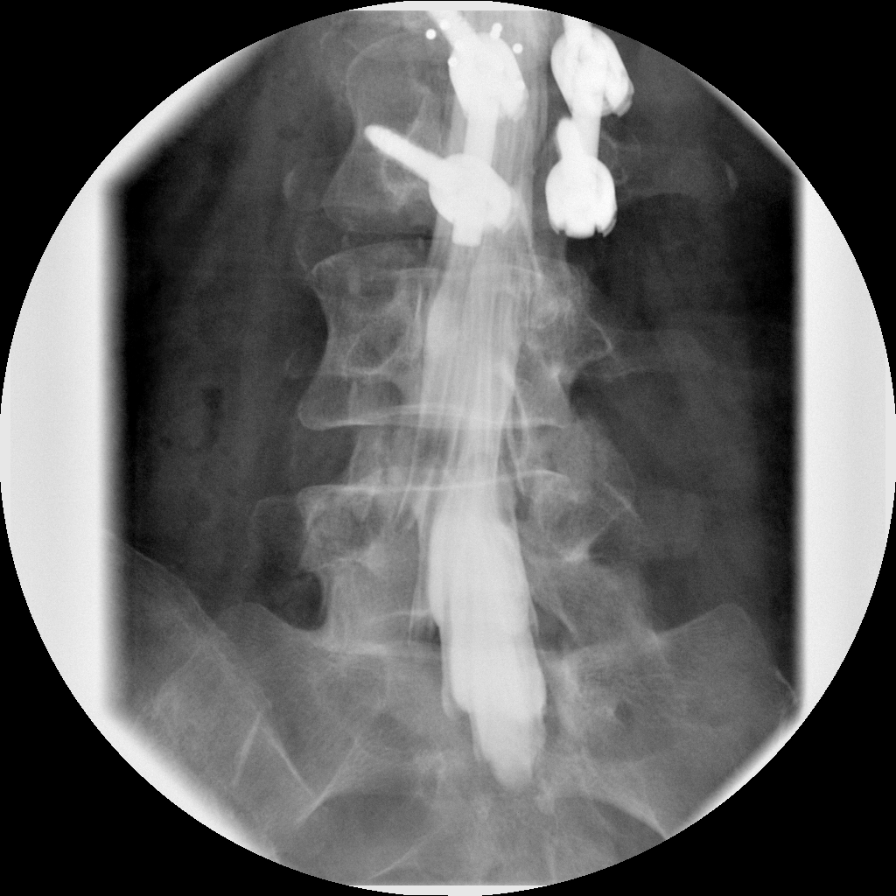

[6 of 19 positions shown; findings below may reference images not displayed]

LUMBAR MYELOGRAM

Procedure: After thorough discussion of risks and benefits of the
procedure including bleeding, infection, injury to nerves, blood
vessels, adjacent structures as well as headache and CSF leak,
written and oral informed consent was obtained.   Consent was
obtained by Dr.Yaneisha Liz. Time out form was completed.

Patient was positioned prone on the fluoroscopy table. Local
anesthesia was provided with 1% lidocaine without epinephrine after
prepped and draped in the usual sterile fashion. Puncture was
performed at L3-L4 using a through 1/2 inch pencil point 22-gauge
spinal needle via right paramedian approach.  Using a single pass
through the dura, the needle was placed within the thecal sac, with
return of clear CSF. 15 mL of Ymnipaque-4SB was injected into the
thecal sac, with normal opacification of the nerve roots and cauda
equina consistent with free flow within the subarachnoid space.

Fluoroscopy time: 0 minutes 45 seconds

I personally performed the lumbar puncture and administered the
intrathecal contrast. I also personally supervised acquisition of
the myelogram images.
FINDINGS: L2-L3 discectomy with interbody bone graft markers.
Posterior rod and pedicle screws are present.  There is no evidence
of hardware loosening or failure on plain films.  Central canal and
lateral recesses appear decompressed.  The anterior disc bulging is
present at L4-L5 with mild impression on the thecal sac.

Bulging disc probably just contacts the descending L5 nerve roots
as they approach the lateral recesses however there is no
convincing evidence of effacement of contrast within the nerve root
sleeves on plain film.  There is mild dextroconvex curvature of the
lumbar spine with the apex at L3.  Bilateral L2 laminotomies.
IMPRESSION: 1.  Technically successful lumbar puncture for lumbar myelogram at
L3-L4 with pencil point needle.
2.  No plain film evidence of complications of L2-L3 posterior
lumbar interbody fusion.
3.  Mild L4-L5 degenerative disc disease with shallow impression on
the anterior thecal sac compatible with disc bulging.

CT LUMBAR MYELOGRAM
FINDINGS: The spinal cord terminates posterior to the L2 vertebra.
Vertebral body height is preserved.  Paraspinal soft tissues are
within normal limits.  Aortoiliac atherosclerosis is present
without aneurysm.  Mild SI joint degenerative disease is present.

T12-L1:  Minimal disc bulging and annular calcification without
stenosis.  Facet joints appear normal.

L1-L2:  Shallow calcified central disc protrusion without resulting
stenosis.  Foramina patent.

L2-L3:  Artifact from interbody bone graft markers.  The L2 pedicle
screws are intact.  The pedicle screws traverse the pedicles.
Bilateral facetectomy with foraminotomies.  The central canal is
widely patent.  Nerve roots appear within normal limits.  Foramina
patent.  The L3 pedicle screws also appear within normal limits.

L3-L4:  Shallow circumferential annular bulging is present with
annular calcification.  Facet joints appear within normal limits.
Very mild central stenosis.  The lateral recesses appear adequately
patent.  The foramina are patent.

L4-L5:  Degenerated disc with circumferential bulging.  There is a
central disc protrusion which appears similar to the prior MRI.
Central stenosis is mild to moderate.  There is encroachment on
both descending L5 nerve roots, left greater than right in the
lateral recesses.  Severe right and mild left facet arthrosis.  The
there is a small marginal cyst/erosion in the inferior L4 articular
process (image number 59 series two). This probably represents and
ordinary degenerative subchondral cyst or synovial cyst rather than
and gouty erosion.   There are mild bilateral foraminal stenosis is
present associated with bulging disc.  No compressive stenosis.

L5-S1:  Mild disc degeneration with shallow circumferential disc
bulging.  Central canal and lateral recesses are patent.  Foramina
patent.
IMPRESSION: 1.  Uncomplicated postoperative changes at L2-L3.  Wide posterior
decompression.  Expected appearance of interbody bone graft for the
this postoperative time frame.
2.  Mild L1-L2 and L3-L4 degenerative disease.
3.  L4-L5 mild to moderate central stenosis with left greater than
right lateral recess encroachment.  Severe right facet arthrosis
with vacuum joint and subchondral cyst formation.  Mild symmetric
bilateral foraminal narrowing associated with bulging disc.
4.  Mild L5-S1 disc bulging without stenosis.

## 2014-09-08 IMAGING — RF DG LUMBAR SPINE 2-3V
1 series · 2 of 2 positions shown · non-contrast
Comparison: It/ 14/1002.

CLINICAL DATA: Posterior lumbar interbody fusion.

EXAM:
LUMBAR SPINE - 2-3 VIEW

[Series 1: run · 2 of 2 slices shown]
[im 1/2]
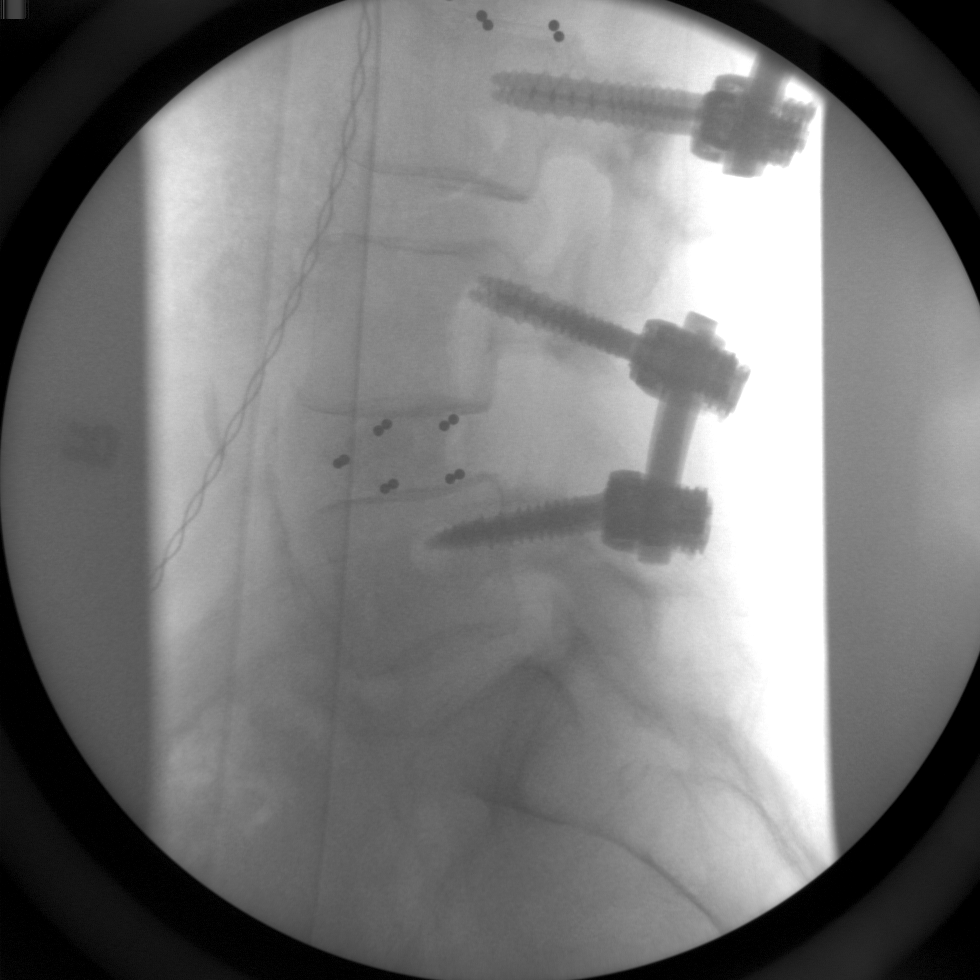
[im 2/2]
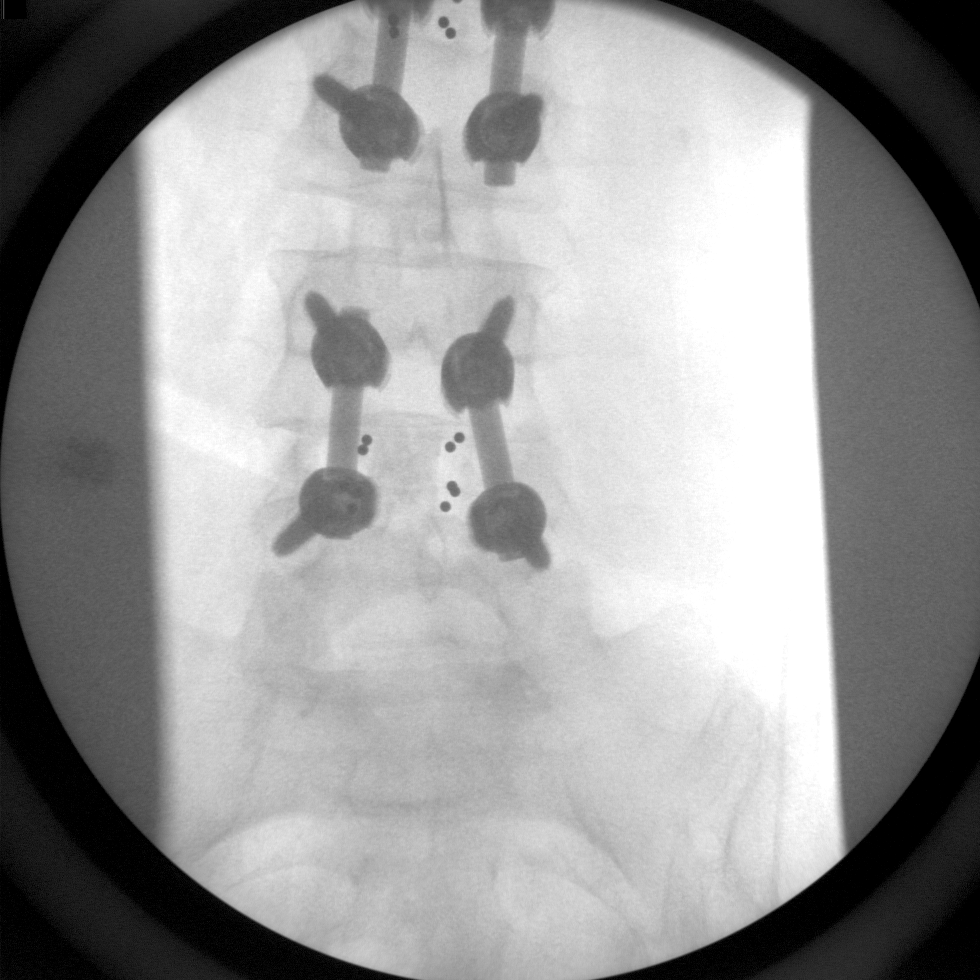

[2 of 2 positions shown; findings below may reference images not displayed]

FINDINGS: Discontinuous fusion has been performed at L4-L5 with L4-L5
diskectomy and interbody bone graft.
IMPRESSION: Interval L4-L5 discectomy with interbody bone graft.

## 2014-11-21 HISTORY — PX: COLONOSCOPY W/ BIOPSIES AND POLYPECTOMY: SHX1376

## 2015-07-09 DIAGNOSIS — E113312 Type 2 diabetes mellitus with moderate nonproliferative diabetic retinopathy with macular edema, left eye: Secondary | ICD-10-CM | POA: Diagnosis not present

## 2015-08-08 DIAGNOSIS — G894 Chronic pain syndrome: Secondary | ICD-10-CM | POA: Diagnosis not present

## 2015-08-08 DIAGNOSIS — E1165 Type 2 diabetes mellitus with hyperglycemia: Secondary | ICD-10-CM | POA: Diagnosis not present

## 2015-08-08 DIAGNOSIS — Z79899 Other long term (current) drug therapy: Secondary | ICD-10-CM | POA: Diagnosis not present

## 2015-08-08 DIAGNOSIS — E11319 Type 2 diabetes mellitus with unspecified diabetic retinopathy without macular edema: Secondary | ICD-10-CM | POA: Diagnosis not present

## 2015-08-08 DIAGNOSIS — E1142 Type 2 diabetes mellitus with diabetic polyneuropathy: Secondary | ICD-10-CM | POA: Diagnosis not present

## 2015-08-30 DIAGNOSIS — R0602 Shortness of breath: Secondary | ICD-10-CM | POA: Diagnosis not present

## 2015-08-30 DIAGNOSIS — R42 Dizziness and giddiness: Secondary | ICD-10-CM | POA: Diagnosis not present

## 2015-08-30 DIAGNOSIS — J111 Influenza due to unidentified influenza virus with other respiratory manifestations: Secondary | ICD-10-CM | POA: Diagnosis not present

## 2015-08-30 DIAGNOSIS — F17200 Nicotine dependence, unspecified, uncomplicated: Secondary | ICD-10-CM | POA: Diagnosis not present

## 2015-08-30 DIAGNOSIS — R05 Cough: Secondary | ICD-10-CM | POA: Diagnosis not present

## 2015-08-30 DIAGNOSIS — R531 Weakness: Secondary | ICD-10-CM | POA: Diagnosis not present

## 2015-08-30 DIAGNOSIS — Z794 Long term (current) use of insulin: Secondary | ICD-10-CM | POA: Diagnosis not present

## 2015-08-30 DIAGNOSIS — J40 Bronchitis, not specified as acute or chronic: Secondary | ICD-10-CM | POA: Diagnosis not present

## 2015-08-30 DIAGNOSIS — J101 Influenza due to other identified influenza virus with other respiratory manifestations: Secondary | ICD-10-CM | POA: Diagnosis not present

## 2015-08-30 DIAGNOSIS — E119 Type 2 diabetes mellitus without complications: Secondary | ICD-10-CM | POA: Diagnosis not present

## 2015-11-05 DIAGNOSIS — E1140 Type 2 diabetes mellitus with diabetic neuropathy, unspecified: Secondary | ICD-10-CM | POA: Diagnosis not present

## 2015-11-05 DIAGNOSIS — G894 Chronic pain syndrome: Secondary | ICD-10-CM | POA: Diagnosis not present

## 2015-11-05 DIAGNOSIS — E11319 Type 2 diabetes mellitus with unspecified diabetic retinopathy without macular edema: Secondary | ICD-10-CM | POA: Diagnosis not present

## 2015-11-10 IMAGING — RF DG THORACIC SPINE 1V
1 series · 2 of 2 positions shown · non-contrast
Comparison: Chest x-ray 05/28/2014.

CLINICAL DATA: Neurostimulator.

EXAM:
OPERATIVE THORACIC SPINE 2 VIEW(S)

[Series 1: run · 2 of 2 slices shown]
[im 1/2]
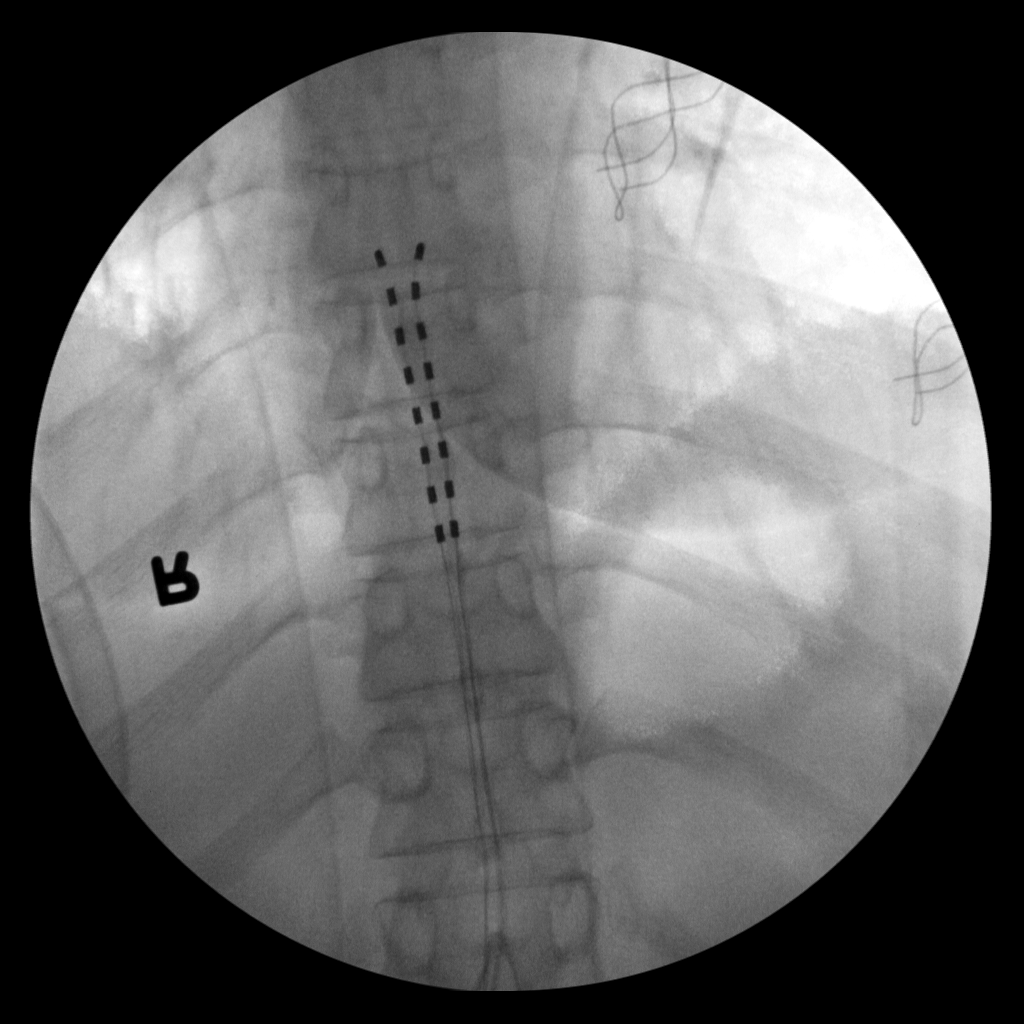
[im 2/2]
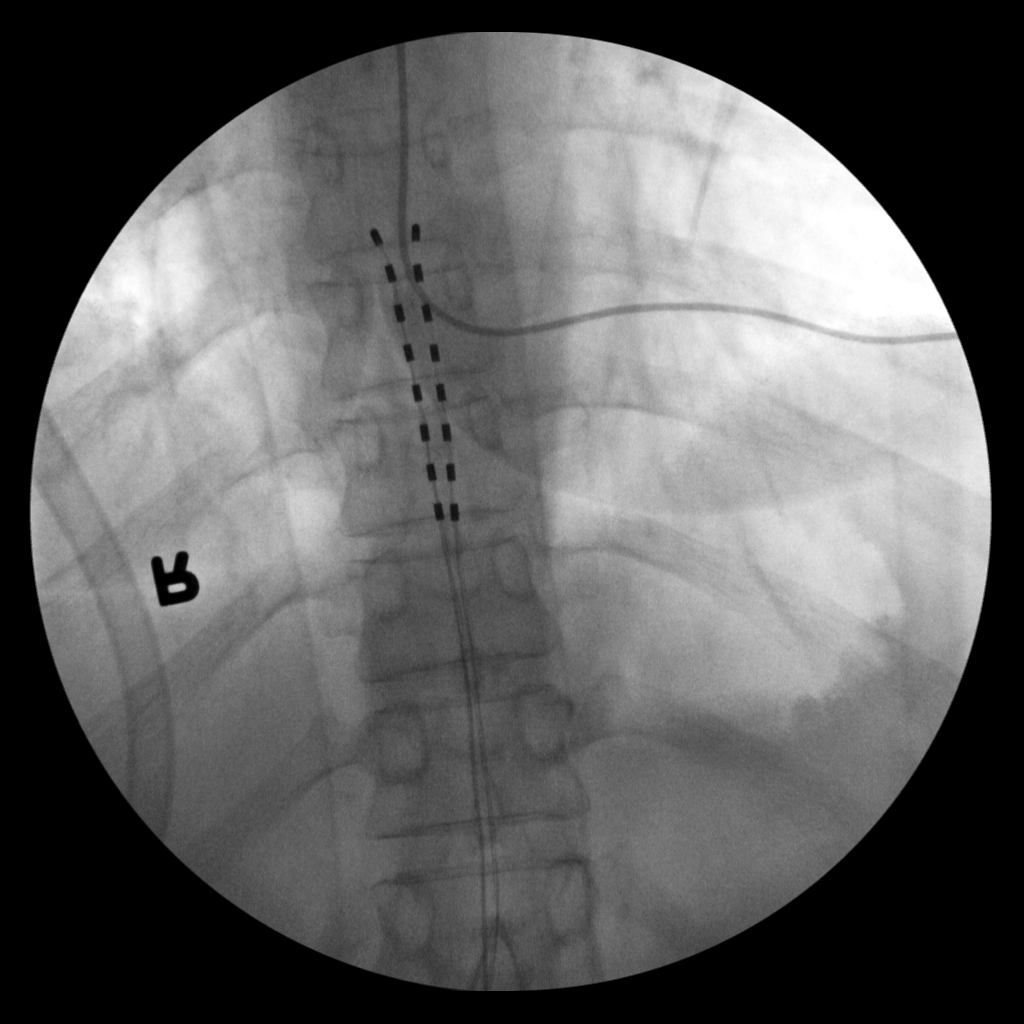

[2 of 2 positions shown; findings below may reference images not displayed]

FINDINGS: Neurostimulator noted. Tips are over the mid to lower thoracic
spine. No acute bony abnormality .
IMPRESSION: Neurostimulator noted with tips noted over the mid to lower thoracic
spine .

## 2015-11-13 DIAGNOSIS — M5417 Radiculopathy, lumbosacral region: Secondary | ICD-10-CM | POA: Diagnosis not present

## 2015-11-13 DIAGNOSIS — G894 Chronic pain syndrome: Secondary | ICD-10-CM | POA: Diagnosis not present

## 2015-11-13 DIAGNOSIS — M545 Low back pain: Secondary | ICD-10-CM | POA: Diagnosis not present

## 2015-11-13 DIAGNOSIS — M4806 Spinal stenosis, lumbar region: Secondary | ICD-10-CM | POA: Diagnosis not present

## 2015-11-13 DIAGNOSIS — M961 Postlaminectomy syndrome, not elsewhere classified: Secondary | ICD-10-CM | POA: Diagnosis not present

## 2015-11-13 DIAGNOSIS — M5416 Radiculopathy, lumbar region: Secondary | ICD-10-CM | POA: Diagnosis not present

## 2015-11-27 DIAGNOSIS — E113393 Type 2 diabetes mellitus with moderate nonproliferative diabetic retinopathy without macular edema, bilateral: Secondary | ICD-10-CM | POA: Diagnosis not present

## 2016-02-09 DIAGNOSIS — J4520 Mild intermittent asthma, uncomplicated: Secondary | ICD-10-CM | POA: Diagnosis not present

## 2016-02-09 DIAGNOSIS — E1140 Type 2 diabetes mellitus with diabetic neuropathy, unspecified: Secondary | ICD-10-CM | POA: Diagnosis not present

## 2016-02-09 DIAGNOSIS — Z1389 Encounter for screening for other disorder: Secondary | ICD-10-CM | POA: Diagnosis not present

## 2016-02-09 DIAGNOSIS — Z5181 Encounter for therapeutic drug level monitoring: Secondary | ICD-10-CM | POA: Diagnosis not present

## 2016-02-09 DIAGNOSIS — Z720 Tobacco use: Secondary | ICD-10-CM | POA: Diagnosis not present

## 2016-02-09 DIAGNOSIS — Z0000 Encounter for general adult medical examination without abnormal findings: Secondary | ICD-10-CM | POA: Diagnosis not present

## 2016-02-09 DIAGNOSIS — E7800 Pure hypercholesterolemia, unspecified: Secondary | ICD-10-CM | POA: Diagnosis not present

## 2016-02-09 DIAGNOSIS — E11319 Type 2 diabetes mellitus with unspecified diabetic retinopathy without macular edema: Secondary | ICD-10-CM | POA: Diagnosis not present

## 2016-02-09 DIAGNOSIS — G894 Chronic pain syndrome: Secondary | ICD-10-CM | POA: Diagnosis not present

## 2016-02-09 DIAGNOSIS — M797 Fibromyalgia: Secondary | ICD-10-CM | POA: Diagnosis not present

## 2016-02-09 DIAGNOSIS — F331 Major depressive disorder, recurrent, moderate: Secondary | ICD-10-CM | POA: Diagnosis not present

## 2016-02-09 DIAGNOSIS — Z87891 Personal history of nicotine dependence: Secondary | ICD-10-CM | POA: Diagnosis not present

## 2016-02-09 DIAGNOSIS — Z79899 Other long term (current) drug therapy: Secondary | ICD-10-CM | POA: Diagnosis not present

## 2016-02-19 DIAGNOSIS — F17200 Nicotine dependence, unspecified, uncomplicated: Secondary | ICD-10-CM | POA: Diagnosis not present

## 2016-02-19 DIAGNOSIS — R112 Nausea with vomiting, unspecified: Secondary | ICD-10-CM | POA: Diagnosis not present

## 2016-02-23 DIAGNOSIS — E113393 Type 2 diabetes mellitus with moderate nonproliferative diabetic retinopathy without macular edema, bilateral: Secondary | ICD-10-CM | POA: Diagnosis not present

## 2016-02-23 DIAGNOSIS — H2513 Age-related nuclear cataract, bilateral: Secondary | ICD-10-CM | POA: Diagnosis not present

## 2016-02-26 DIAGNOSIS — K2950 Unspecified chronic gastritis without bleeding: Secondary | ICD-10-CM | POA: Diagnosis not present

## 2016-02-26 DIAGNOSIS — I2510 Atherosclerotic heart disease of native coronary artery without angina pectoris: Secondary | ICD-10-CM | POA: Diagnosis not present

## 2016-02-26 DIAGNOSIS — R112 Nausea with vomiting, unspecified: Secondary | ICD-10-CM | POA: Diagnosis not present

## 2016-02-26 DIAGNOSIS — J449 Chronic obstructive pulmonary disease, unspecified: Secondary | ICD-10-CM | POA: Diagnosis not present

## 2016-02-26 DIAGNOSIS — Z794 Long term (current) use of insulin: Secondary | ICD-10-CM | POA: Diagnosis not present

## 2016-02-26 DIAGNOSIS — I10 Essential (primary) hypertension: Secondary | ICD-10-CM | POA: Diagnosis not present

## 2016-02-26 DIAGNOSIS — F17210 Nicotine dependence, cigarettes, uncomplicated: Secondary | ICD-10-CM | POA: Diagnosis not present

## 2016-02-26 DIAGNOSIS — Z79899 Other long term (current) drug therapy: Secondary | ICD-10-CM | POA: Diagnosis not present

## 2016-02-26 DIAGNOSIS — M797 Fibromyalgia: Secondary | ICD-10-CM | POA: Diagnosis not present

## 2016-02-26 DIAGNOSIS — E11319 Type 2 diabetes mellitus with unspecified diabetic retinopathy without macular edema: Secondary | ICD-10-CM | POA: Diagnosis not present

## 2016-02-26 DIAGNOSIS — K219 Gastro-esophageal reflux disease without esophagitis: Secondary | ICD-10-CM | POA: Diagnosis not present

## 2016-02-26 DIAGNOSIS — E7800 Pure hypercholesterolemia, unspecified: Secondary | ICD-10-CM | POA: Diagnosis not present

## 2016-02-26 DIAGNOSIS — K2900 Acute gastritis without bleeding: Secondary | ICD-10-CM | POA: Diagnosis not present

## 2016-02-26 DIAGNOSIS — R1310 Dysphagia, unspecified: Secondary | ICD-10-CM | POA: Diagnosis not present

## 2016-02-26 DIAGNOSIS — K2970 Gastritis, unspecified, without bleeding: Secondary | ICD-10-CM | POA: Diagnosis not present

## 2016-02-26 HISTORY — PX: ESOPHAGOGASTRODUODENOSCOPY: SHX1529

## 2016-02-27 DIAGNOSIS — Z1231 Encounter for screening mammogram for malignant neoplasm of breast: Secondary | ICD-10-CM | POA: Diagnosis not present

## 2016-03-15 DIAGNOSIS — R928 Other abnormal and inconclusive findings on diagnostic imaging of breast: Secondary | ICD-10-CM | POA: Diagnosis not present

## 2016-05-07 DIAGNOSIS — Z5181 Encounter for therapeutic drug level monitoring: Secondary | ICD-10-CM | POA: Diagnosis not present

## 2016-05-07 DIAGNOSIS — Z79899 Other long term (current) drug therapy: Secondary | ICD-10-CM | POA: Diagnosis not present

## 2016-05-07 DIAGNOSIS — G894 Chronic pain syndrome: Secondary | ICD-10-CM | POA: Diagnosis not present

## 2016-06-03 DIAGNOSIS — Z5181 Encounter for therapeutic drug level monitoring: Secondary | ICD-10-CM | POA: Diagnosis not present

## 2016-06-03 DIAGNOSIS — Z79899 Other long term (current) drug therapy: Secondary | ICD-10-CM | POA: Diagnosis not present

## 2016-06-03 DIAGNOSIS — G894 Chronic pain syndrome: Secondary | ICD-10-CM | POA: Diagnosis not present

## 2016-06-11 DIAGNOSIS — E113393 Type 2 diabetes mellitus with moderate nonproliferative diabetic retinopathy without macular edema, bilateral: Secondary | ICD-10-CM | POA: Diagnosis not present

## 2016-06-14 DIAGNOSIS — G894 Chronic pain syndrome: Secondary | ICD-10-CM | POA: Diagnosis not present

## 2016-07-02 DIAGNOSIS — R062 Wheezing: Secondary | ICD-10-CM | POA: Diagnosis not present

## 2016-07-02 DIAGNOSIS — J209 Acute bronchitis, unspecified: Secondary | ICD-10-CM | POA: Diagnosis not present

## 2016-07-02 DIAGNOSIS — F17200 Nicotine dependence, unspecified, uncomplicated: Secondary | ICD-10-CM | POA: Diagnosis not present

## 2016-07-06 DIAGNOSIS — E113393 Type 2 diabetes mellitus with moderate nonproliferative diabetic retinopathy without macular edema, bilateral: Secondary | ICD-10-CM | POA: Diagnosis not present

## 2016-07-13 DIAGNOSIS — Z79899 Other long term (current) drug therapy: Secondary | ICD-10-CM | POA: Diagnosis not present

## 2016-07-13 DIAGNOSIS — Z5181 Encounter for therapeutic drug level monitoring: Secondary | ICD-10-CM | POA: Diagnosis not present

## 2016-07-13 DIAGNOSIS — G894 Chronic pain syndrome: Secondary | ICD-10-CM | POA: Diagnosis not present

## 2016-09-02 DIAGNOSIS — E113393 Type 2 diabetes mellitus with moderate nonproliferative diabetic retinopathy without macular edema, bilateral: Secondary | ICD-10-CM | POA: Diagnosis not present

## 2016-10-13 DIAGNOSIS — Z5181 Encounter for therapeutic drug level monitoring: Secondary | ICD-10-CM | POA: Diagnosis not present

## 2016-10-13 DIAGNOSIS — Z79899 Other long term (current) drug therapy: Secondary | ICD-10-CM | POA: Diagnosis not present

## 2016-10-13 DIAGNOSIS — E1140 Type 2 diabetes mellitus with diabetic neuropathy, unspecified: Secondary | ICD-10-CM | POA: Diagnosis not present

## 2016-10-13 DIAGNOSIS — E1165 Type 2 diabetes mellitus with hyperglycemia: Secondary | ICD-10-CM | POA: Diagnosis not present

## 2016-10-13 DIAGNOSIS — G894 Chronic pain syndrome: Secondary | ICD-10-CM | POA: Diagnosis not present

## 2017-01-12 DIAGNOSIS — R030 Elevated blood-pressure reading, without diagnosis of hypertension: Secondary | ICD-10-CM | POA: Diagnosis not present

## 2017-01-12 DIAGNOSIS — Z5181 Encounter for therapeutic drug level monitoring: Secondary | ICD-10-CM | POA: Diagnosis not present

## 2017-01-12 DIAGNOSIS — Z79899 Other long term (current) drug therapy: Secondary | ICD-10-CM | POA: Diagnosis not present

## 2017-01-12 DIAGNOSIS — E113393 Type 2 diabetes mellitus with moderate nonproliferative diabetic retinopathy without macular edema, bilateral: Secondary | ICD-10-CM | POA: Diagnosis not present

## 2017-01-12 DIAGNOSIS — G894 Chronic pain syndrome: Secondary | ICD-10-CM | POA: Diagnosis not present

## 2017-01-12 DIAGNOSIS — E1165 Type 2 diabetes mellitus with hyperglycemia: Secondary | ICD-10-CM | POA: Diagnosis not present

## 2017-03-15 DIAGNOSIS — E113393 Type 2 diabetes mellitus with moderate nonproliferative diabetic retinopathy without macular edema, bilateral: Secondary | ICD-10-CM | POA: Diagnosis not present

## 2017-03-15 DIAGNOSIS — H532 Diplopia: Secondary | ICD-10-CM | POA: Diagnosis not present

## 2017-03-15 DIAGNOSIS — H2513 Age-related nuclear cataract, bilateral: Secondary | ICD-10-CM | POA: Diagnosis not present

## 2017-03-16 DIAGNOSIS — H532 Diplopia: Secondary | ICD-10-CM | POA: Diagnosis not present

## 2017-03-16 DIAGNOSIS — R51 Headache: Secondary | ICD-10-CM | POA: Diagnosis not present

## 2017-03-16 DIAGNOSIS — I639 Cerebral infarction, unspecified: Secondary | ICD-10-CM | POA: Diagnosis not present

## 2017-03-16 DIAGNOSIS — R42 Dizziness and giddiness: Secondary | ICD-10-CM | POA: Diagnosis not present

## 2017-03-17 DIAGNOSIS — Z794 Long term (current) use of insulin: Secondary | ICD-10-CM | POA: Diagnosis not present

## 2017-03-17 DIAGNOSIS — I6523 Occlusion and stenosis of bilateral carotid arteries: Secondary | ICD-10-CM | POA: Diagnosis not present

## 2017-03-17 DIAGNOSIS — E119 Type 2 diabetes mellitus without complications: Secondary | ICD-10-CM | POA: Diagnosis not present

## 2017-03-17 DIAGNOSIS — R51 Headache: Secondary | ICD-10-CM | POA: Diagnosis not present

## 2017-03-17 DIAGNOSIS — H539 Unspecified visual disturbance: Secondary | ICD-10-CM | POA: Diagnosis not present

## 2017-03-17 DIAGNOSIS — H532 Diplopia: Secondary | ICD-10-CM | POA: Diagnosis not present

## 2017-03-17 DIAGNOSIS — I672 Cerebral atherosclerosis: Secondary | ICD-10-CM | POA: Diagnosis not present

## 2017-03-17 DIAGNOSIS — H4901 Third [oculomotor] nerve palsy, right eye: Secondary | ICD-10-CM | POA: Diagnosis not present

## 2017-03-17 DIAGNOSIS — E11319 Type 2 diabetes mellitus with unspecified diabetic retinopathy without macular edema: Secondary | ICD-10-CM | POA: Diagnosis not present

## 2017-03-17 DIAGNOSIS — D72829 Elevated white blood cell count, unspecified: Secondary | ICD-10-CM | POA: Diagnosis not present

## 2017-03-21 DIAGNOSIS — I6523 Occlusion and stenosis of bilateral carotid arteries: Secondary | ICD-10-CM | POA: Diagnosis not present

## 2017-03-21 DIAGNOSIS — R42 Dizziness and giddiness: Secondary | ICD-10-CM | POA: Diagnosis not present

## 2017-03-21 DIAGNOSIS — R51 Headache: Secondary | ICD-10-CM | POA: Diagnosis not present

## 2017-03-21 DIAGNOSIS — H532 Diplopia: Secondary | ICD-10-CM | POA: Diagnosis not present

## 2017-03-29 DIAGNOSIS — I639 Cerebral infarction, unspecified: Secondary | ICD-10-CM | POA: Diagnosis not present

## 2017-03-29 DIAGNOSIS — R0602 Shortness of breath: Secondary | ICD-10-CM | POA: Diagnosis not present

## 2017-03-29 DIAGNOSIS — I081 Rheumatic disorders of both mitral and tricuspid valves: Secondary | ICD-10-CM | POA: Diagnosis not present

## 2017-04-04 DIAGNOSIS — T85192A Other mechanical complication of implanted electronic neurostimulator (electrode) of spinal cord, initial encounter: Secondary | ICD-10-CM | POA: Insufficient documentation

## 2017-04-04 DIAGNOSIS — M961 Postlaminectomy syndrome, not elsewhere classified: Secondary | ICD-10-CM | POA: Insufficient documentation

## 2017-04-04 DIAGNOSIS — M5416 Radiculopathy, lumbar region: Secondary | ICD-10-CM | POA: Diagnosis not present

## 2017-04-04 DIAGNOSIS — G894 Chronic pain syndrome: Secondary | ICD-10-CM | POA: Diagnosis not present

## 2017-04-04 DIAGNOSIS — M5417 Radiculopathy, lumbosacral region: Secondary | ICD-10-CM | POA: Diagnosis not present

## 2017-04-05 ENCOUNTER — Other Ambulatory Visit: Payer: Self-pay | Admitting: Anesthesiology

## 2017-04-05 DIAGNOSIS — M961 Postlaminectomy syndrome, not elsewhere classified: Secondary | ICD-10-CM | POA: Diagnosis not present

## 2017-04-05 DIAGNOSIS — Z9689 Presence of other specified functional implants: Secondary | ICD-10-CM | POA: Diagnosis not present

## 2017-04-05 DIAGNOSIS — H4901 Third [oculomotor] nerve palsy, right eye: Secondary | ICD-10-CM | POA: Diagnosis not present

## 2017-04-05 DIAGNOSIS — H4900 Third [oculomotor] nerve palsy, unspecified eye: Secondary | ICD-10-CM | POA: Insufficient documentation

## 2017-04-08 NOTE — Pre-Procedure Instructions (Signed)
Paula Hamilton  04/08/2017      [id number],  - [address] [place] [address] Phone: [phone] Fax: [phone]    Your procedure is scheduled on April 12, 2017.  Report to [facility] Admitting at 1000 AM.  Call this number if you have problems the morning of surgery:  [phone]   Remember:  Do not eat food or drink liquids after midnight.  Take these medicines the morning of surgery with A SIP OF WATER acetaminophen (tylenol), albuterol inhaler (bring inhaler with you), docusate sodium (colace), duloxetine (cymbalta), morphine (MS Contin), omeprazole (prilosec).  7 days prior to surgery STOP taking any Aspirin (unless otherwise instructed by your surgeon), Aleve, Naproxen, Ibuprofen, Motrin, Advil, Goody's, BC's, all herbal medications, fish oil, and all vitamins  Continue all other medications as instructed by your physician except follow the above medication instructions before surgery   WHAT DO I DO ABOUT MY DIABETES MEDICATION?   Marland Kitchen Do not take oral diabetes medicines (pills) the morning of surgery.  . THE NIGHT BEFORE SURGERY, take ___________ units of ___________insulin.       . THE MORNING OF SURGERY, take _____________ units of __________insulin.  . The day of surgery, do not take other diabetes injectables, including Byetta (exenatide), Bydureon (exenatide ER), Victoza (liraglutide), or Trulicity (dulaglutide).  . If your CBG is greater than 220 mg/dL, you may take  of your sliding scale (correction) dose of insulin.  Other Instructions:          Patient Signature:  Date:   Nurse Signature:  Date:   Reviewed and Endorsed by [facility] Patient Education Committee, August 2015  How to Manage Your Diabetes Before and After Surgery  Why is it important to control my blood sugar before and after surgery? . Improving blood sugar levels before and after surgery helps healing and  can limit problems. . A way of improving blood sugar control is eating a healthy diet by: o  Eating less sugar and carbohydrates o  Increasing activity/exercise o  Talking with your doctor about reaching your blood sugar goals . High blood sugars (greater than 180 mg/dL) can raise your risk of infections and slow your recovery, so you will need to focus on controlling your diabetes during the weeks before surgery. . Make sure that the doctor who takes care of your diabetes knows about your planned surgery including the date and location.  How do I manage my blood sugar before surgery? . Check your blood sugar at least 4 times a day, starting 2 days before surgery, to make sure that the level is not too high or low. o Check your blood sugar the morning of your surgery when you wake up and every 2 hours until you get to the Short Stay unit. . If your blood sugar is less than 70 mg/dL, you will need to treat for low blood sugar: o Do not take insulin. o Treat a low blood sugar (less than 70 mg/dL) with  cup of clear juice (cranberry or apple), 4 glucose tablets, OR glucose gel. o Recheck blood sugar in 15 minutes after treatment (to make sure it is greater than 70 mg/dL). If your blood sugar is not greater than 70 mg/dL on recheck, call [phone] for further instructions. . Report your blood sugar to the short stay nurse when you get to Short Stay.  . If you are admitted to the hospital after surgery:  o Your blood sugar will be checked by the staff and you will probably be given insulin after surgery (instead of oral diabetes medicines) to make sure you have good blood sugar levels. o The goal for blood sugar control after surgery is 80-180 mg/dL     Do not wear jewelry, make-up or nail polish.  Do not wear lotions, powders, or perfumes, or deoderant.  Do not shave 48 hours prior to surgery.  Men may shave face and neck.  Do not bring valuables to the hospital.  [facility] is not  responsible for any belongings or valuables.  Contacts, dentures or bridgework may not be worn into surgery.  Leave your suitcase in the car.  After surgery it may be brought to your room.  For patients admitted to the hospital, discharge time will be determined by your treatment team.  Patients discharged the day of surgery will not be allowed to drive home.   Special instructions:   [place]- Preparing For Surgery  Before surgery, you can play an important role. Because skin is not sterile, your skin needs to be as free of germs as possible. You can reduce the number of germs on your skin by washing with CHG (chlorahexidine gluconate) Soap before surgery.  CHG is an antiseptic cleaner which kills germs and bonds with the skin to continue killing germs even after washing.  Please do not use if you have an allergy to CHG or antibacterial soaps. If your skin becomes reddened/irritated stop using the CHG.  Do not shave (including legs and underarms) for at least 48 hours prior to first CHG shower. It is OK to shave your face.  Please follow these instructions carefully.   1. Shower the NIGHT BEFORE SURGERY and the MORNING OF SURGERY with CHG.   2. If you chose to wash your hair, wash your hair first as usual with your normal shampoo.  3. After you shampoo, rinse your hair and body thoroughly to remove the shampoo.  4. Use CHG as you would any other liquid soap. You can apply CHG directly to the skin and wash gently with a scrungie or a clean washcloth.   5. Apply the CHG Soap to your body ONLY FROM THE NECK DOWN.  Do not use on open wounds or open sores. Avoid contact with your eyes, ears, mouth and genitals (private parts). Wash genitals (private parts) with your normal soap.  USE REGULAR SHAMPOO AND CONDITIONER FOR HAIR USE REGULAR SOAP FOR FACE AND PRIVATE AREA  6. Wash thoroughly, paying special attention to the area where your surgery will be performed.  7. Thoroughly rinse  your body with warm water from the neck down.  8. DO NOT shower/wash with your normal soap after using and rinsing off the CHG Soap.  9. Pat yourself dry with a CLEAN TOWEL and WASH CLOTH  10. Wear CLEAN PAJAMAS to bed the night before surgery, wear comfortable clothes the morning of surgery  11. Place CLEAN SHEETS on your bed the night of your first shower and DO NOT SLEEP WITH PETS.    Day of Surgery: Do not apply any deodorants/lotions. Please wear clean clothes to the hospital/surgery center.     Please read over the following fact sheets that you were given. Pain Booklet, Coughing and Deep Breathing and Surgical Site Infection Prevention

## 2017-04-11 ENCOUNTER — Encounter (HOSPITAL_COMMUNITY)
Admission: RE | Admit: 2017-04-11 | Discharge: 2017-04-11 | Disposition: A | Discharge status: Home / Self Care | Payer: PPO | Source: Ambulatory Visit | Attending: Anesthesiology | Admitting: Anesthesiology

## 2017-04-11 ENCOUNTER — Encounter (HOSPITAL_COMMUNITY): Payer: Self-pay

## 2017-04-11 DIAGNOSIS — F329 Major depressive disorder, single episode, unspecified: Secondary | ICD-10-CM | POA: Diagnosis not present

## 2017-04-11 DIAGNOSIS — Z79899 Other long term (current) drug therapy: Secondary | ICD-10-CM | POA: Diagnosis not present

## 2017-04-11 DIAGNOSIS — Z791 Long term (current) use of non-steroidal anti-inflammatories (NSAID): Secondary | ICD-10-CM | POA: Diagnosis not present

## 2017-04-11 DIAGNOSIS — Z794 Long term (current) use of insulin: Secondary | ICD-10-CM | POA: Diagnosis not present

## 2017-04-11 DIAGNOSIS — J449 Chronic obstructive pulmonary disease, unspecified: Secondary | ICD-10-CM | POA: Diagnosis not present

## 2017-04-11 DIAGNOSIS — F419 Anxiety disorder, unspecified: Secondary | ICD-10-CM | POA: Diagnosis not present

## 2017-04-11 DIAGNOSIS — M1990 Unspecified osteoarthritis, unspecified site: Secondary | ICD-10-CM | POA: Diagnosis not present

## 2017-04-11 DIAGNOSIS — Z87891 Personal history of nicotine dependence: Secondary | ICD-10-CM | POA: Diagnosis not present

## 2017-04-11 DIAGNOSIS — M797 Fibromyalgia: Secondary | ICD-10-CM | POA: Diagnosis not present

## 2017-04-11 DIAGNOSIS — E1140 Type 2 diabetes mellitus with diabetic neuropathy, unspecified: Secondary | ICD-10-CM | POA: Diagnosis not present

## 2017-04-11 DIAGNOSIS — Z79891 Long term (current) use of opiate analgesic: Secondary | ICD-10-CM | POA: Diagnosis not present

## 2017-04-11 DIAGNOSIS — Z888 Allergy status to other drugs, medicaments and biological substances status: Secondary | ICD-10-CM | POA: Diagnosis not present

## 2017-04-11 DIAGNOSIS — G8929 Other chronic pain: Secondary | ICD-10-CM | POA: Diagnosis not present

## 2017-04-11 DIAGNOSIS — Z880 Allergy status to penicillin: Secondary | ICD-10-CM | POA: Diagnosis not present

## 2017-04-11 DIAGNOSIS — M5416 Radiculopathy, lumbar region: Secondary | ICD-10-CM | POA: Diagnosis not present

## 2017-04-11 DIAGNOSIS — K219 Gastro-esophageal reflux disease without esophagitis: Secondary | ICD-10-CM | POA: Diagnosis not present

## 2017-04-11 DIAGNOSIS — Z4542 Encounter for adjustment and management of neuropacemaker (brain) (peripheral nerve) (spinal cord): Secondary | ICD-10-CM | POA: Diagnosis not present

## 2017-04-11 HISTORY — DX: Third (oculomotor) nerve palsy, right eye: H49.01

## 2017-04-11 LAB — CBC
HCT: 44.3 % (ref 36.0–46.0)
Hemoglobin: 14.8 g/dL (ref 12.0–15.0)
MCH: 31.9 pg (ref 26.0–34.0)
MCHC: 33.4 g/dL (ref 30.0–36.0)
MCV: 95.5 fL (ref 78.0–100.0)
PLATELETS: 293 10*3/uL (ref 150–400)
RBC: 4.64 MIL/uL (ref 3.87–5.11)
RDW: 13.8 % (ref 11.5–15.5)
WBC: 11.8 10*3/uL — ABNORMAL HIGH (ref 4.0–10.5)

## 2017-04-11 LAB — BASIC METABOLIC PANEL
Anion gap: 7 (ref 5–15)
BUN: 19 mg/dL (ref 6–20)
CALCIUM: 8.9 mg/dL (ref 8.9–10.3)
CO2: 29 mmol/L (ref 22–32)
CREATININE: 0.84 mg/dL (ref 0.44–1.00)
Chloride: 104 mmol/L (ref 101–111)
GFR calc Af Amer: >60 mL/min (ref >60)
Glucose, Bld: 205 mg/dL — ABNORMAL HIGH (ref 65–99)
Potassium: 4.4 mmol/L (ref 3.5–5.1)
SODIUM: 140 mmol/L (ref 135–145)

## 2017-04-11 LAB — SURGICAL PCR SCREEN
MRSA, PCR: NEGATIVE
Staphylococcus aureus: NEGATIVE

## 2017-04-11 LAB — GLUCOSE, CAPILLARY: Glucose-Capillary: 205 mg/dL — ABNORMAL HIGH (ref 65–99)

## 2017-04-11 LAB — HEMOGLOBIN A1C
HEMOGLOBIN A1C: 7.1 % — AB (ref 4.8–5.6)
MEAN PLASMA GLUCOSE: 157.07 mg/dL

## 2017-04-11 MED ORDER — VANCOMYCIN HCL IN DEXTROSE 1-5 GM/200ML-% IV SOLN
1000.0000 mg | INTRAVENOUS | Status: AC
  Administered 2017-04-12: 1000 mg via INTRAVENOUS
  Filled 2017-04-11: qty 200

## 2017-04-11 NOTE — Pre-Procedure Instructions (Signed)
Paula Hamilton  04/11/2017      [id number] Rosalita Levan, [place] - [address] [place] [address] Phone: [phone] Fax: [phone]    Your procedure is scheduled on Tuesday, April 12, 2017.   Report to [facility] Admitting at 10:00 AM.             (posted surgery time 12:01p - 1:02p)   Call this number if you have problems the MORNING of surgery:  [phone].   Remember:   Do not eat food or drink liquids after midnight tonight.   Take these medicines the morning of surgery with A SIP OF WATER acetaminophen (tylenol), albuterol inhaler (bring inhaler with you), duloxetine (cymbalta), morphine (MS Contin), omeprazole (prilosec).  7 days prior to surgery STOP taking any Aspirin (unless otherwise instructed by your surgeon), Aleve, Naproxen, Ibuprofen, Motrin, Advil, Goody's, BC's, all herbal medications, fish oil, and all vitamins  Continue all other medications as instructed by your physician except follow the above medication instructions before surgery   WHAT DO I DO ABOUT MY DIABETES MEDICATION?   Marland Kitchen Do not take oral diabetes medicines (pills) the morning of surgery.  . THE NIGHT BEFORE SURGERY, take _usual 25_ units of _Xultophy_insulin.       . THE MORNING OF SURGERY, take zero/none units of Xultophy insulin.  . The day of surgery, do not take other diabetes injectables, including Byetta (exenatide), Bydureon (exenatide ER), Victoza (liraglutide), or Trulicity (dulaglutide).  . If your CBG is greater than 220 mg/dL, you may take  of your sliding scale (correction) dose of insulin.  Other Instructions:          Patient Signature:  Date:   Nurse Signature:  Date:   Reviewed and Endorsed by [facility] Patient Education Committee, August 2015  How to Manage Your Diabetes Before and After Surgery  Why is it important to control my blood sugar before and after surgery? . Improving blood sugar levels  before and after surgery helps healing and can limit problems. .  . A way of improving blood sugar control is eating a healthy diet by: o  Eating less sugar and carbohydrates o  Increasing activity/exercise o  Talking with your doctor about reaching your blood sugar goals  . High blood sugars (greater than 180 mg/dL) can raise your risk of infections and slow your recovery, so you will need to focus on controlling your diabetes during the weeks before surgery. . Make sure that the doctor who takes care of your diabetes knows about your planned surgery including the date and location.  How do I manage my blood sugar before surgery? . Check your blood sugar at least 4 times a day, starting 2 days before surgery, to make sure that the level is not too high or low. o Check your blood sugar the morning of your surgery when you wake up and every 2 hours until you get to the Short Stay unit. o  . If your blood sugar is less than 70 mg/dL, you will need to treat for low blood sugar: o Do not take insulin. o Treat a low blood sugar (less than 70 mg/dL) with  cup of clear juice (cranberry or apple), 4 glucose tablets, OR glucose gel. o  o Recheck blood sugar in 15 minutes after treatment (to make sure it is greater than 70 mg/dL). If your blood sugar is not greater than 70 mg/dL on recheck, call [phone]  for further instructions. . Report your blood sugar to the short stay nurse when you get to Short Stay.  . If you are admitted to the hospital after surgery: o Your blood sugar will be checked by the staff and you will probably be given insulin after surgery (instead of oral diabetes medicines) to make sure you have good blood sugar levels. o The goal for blood sugar control after surgery is 80-180 mg/dL                Do not wear jewelry, make-up or nail polish.  Do not wear lotions, powders, perfumes, or deoderant.  Do not shave 48 hours prior to surgery.  Men may shave face and neck.  Do  not bring valuables to the hospital.  [facility] is not responsible for any belongings or valuables.  Contacts, dentures or bridgework may not be worn into surgery.  Leave your suitcase in the car.  After surgery it may be brought to your room.  For patients admitted to the hospital, discharge time will be determined by your treatment team.  Patients discharged the day of surgery will not be allowed to drive home and will need someone to stay with you for the first 24 hrs.     Special instructions:   [facility]- Preparing For Surgery  Before surgery, you can play an important role. Because skin is not sterile, your skin needs to be as free of germs as possible. You can reduce the number of germs on your skin by washing with CHG (chlorahexidine gluconate) Soap before surgery.  CHG is an antiseptic cleaner which kills germs and bonds with the skin to continue killing germs even after washing.  Please do not use if you have an allergy to CHG or antibacterial soaps. If your skin becomes reddened/irritated stop using the CHG.  Do not shave (including legs and underarms) for at least 48 hours prior to first CHG shower. It is OK to shave your face.  Please follow these instructions carefully.   1. Shower the NIGHT BEFORE SURGERY and the MORNING OF SURGERY with CHG.   2. If you chose to wash your hair, wash your hair first as usual with your normal shampoo.  3. After you shampoo, rinse your hair and body thoroughly to remove the shampoo.  4. Use CHG as you would any other liquid soap. You can apply CHG directly to the skin and wash gently with a scrungie or a clean washcloth.   5. Apply the CHG Soap to your body ONLY FROM THE NECK DOWN.  Do not use on open wounds or open sores. Avoid contact with your eyes, ears, mouth and genitals (private parts). Wash genitals (private parts) with your normal soap.  USE REGULAR SHAMPOO AND CONDITIONER FOR HAIR USE REGULAR SOAP FOR FACE AND PRIVATE  AREA  6. Wash thoroughly, paying special attention to the area where your surgery will be performed.  7. Thoroughly rinse your body with warm water from the neck down.  8. DO NOT shower/wash with your normal soap after using and rinsing off the CHG Soap.  9. Pat yourself dry with a CLEAN TOWEL and WASH CLOTH  10. Wear CLEAN PAJAMAS to bed the night before surgery, wear comfortable clothes the morning of surgery  11. Place CLEAN SHEETS on your bed the night of your first shower and DO NOT SLEEP WITH PETS.    Day of Surgery: Do not apply any deodorants/lotions. Please wear clean clothes to the  hospital/surgery center.     Please read over the following fact sheets that you were given. Pain Booklet, Coughing and Deep Breathing and Surgical Site Infection Prevention

## 2017-04-11 NOTE — Progress Notes (Addendum)
Denies any cardiac issues.  She does state, when questioned, denies. Note in epic shows murmur, but patient denies.  No sob, palpitations.   Denies any cardiac tests. PCP is Dr. Michel Santee Eyk  LOV 03/2017 at [facility].  He also manages her diabetes. Morning sugars range between 107-147.  Last A1C was done in July 2018 7.3.  Will do one today. Spoke with diabetes coordinator about this new insulin Xultophy.  Patient has been instructed (since insulins are mixed) to hold Xultophy the morning of surgery.  She and her daughter understand.

## 2017-04-12 ENCOUNTER — Ambulatory Visit (HOSPITAL_COMMUNITY)
Admission: RE | Admit: 2017-04-12 | Discharge: 2017-04-12 | Disposition: A | Discharge status: Home / Self Care | Payer: PPO | Source: Ambulatory Visit | Attending: Anesthesiology | Admitting: Anesthesiology

## 2017-04-12 ENCOUNTER — Ambulatory Visit (HOSPITAL_COMMUNITY): Payer: PPO | Admitting: Anesthesiology

## 2017-04-12 ENCOUNTER — Encounter (HOSPITAL_COMMUNITY): Payer: Self-pay | Admitting: General Practice

## 2017-04-12 ENCOUNTER — Encounter (HOSPITAL_COMMUNITY)
Admission: RE | Disposition: A | Discharge status: Home / Self Care | Payer: Self-pay | Source: Ambulatory Visit | Attending: Anesthesiology

## 2017-04-12 DIAGNOSIS — G8929 Other chronic pain: Secondary | ICD-10-CM | POA: Insufficient documentation

## 2017-04-12 DIAGNOSIS — M1990 Unspecified osteoarthritis, unspecified site: Secondary | ICD-10-CM | POA: Diagnosis not present

## 2017-04-12 DIAGNOSIS — F419 Anxiety disorder, unspecified: Secondary | ICD-10-CM | POA: Insufficient documentation

## 2017-04-12 DIAGNOSIS — Z888 Allergy status to other drugs, medicaments and biological substances status: Secondary | ICD-10-CM | POA: Insufficient documentation

## 2017-04-12 DIAGNOSIS — F329 Major depressive disorder, single episode, unspecified: Secondary | ICD-10-CM | POA: Diagnosis not present

## 2017-04-12 DIAGNOSIS — M797 Fibromyalgia: Secondary | ICD-10-CM | POA: Insufficient documentation

## 2017-04-12 DIAGNOSIS — E1140 Type 2 diabetes mellitus with diabetic neuropathy, unspecified: Secondary | ICD-10-CM | POA: Insufficient documentation

## 2017-04-12 DIAGNOSIS — Z87891 Personal history of nicotine dependence: Secondary | ICD-10-CM | POA: Insufficient documentation

## 2017-04-12 DIAGNOSIS — Z880 Allergy status to penicillin: Secondary | ICD-10-CM | POA: Diagnosis not present

## 2017-04-12 DIAGNOSIS — Z79891 Long term (current) use of opiate analgesic: Secondary | ICD-10-CM | POA: Insufficient documentation

## 2017-04-12 DIAGNOSIS — Z4542 Encounter for adjustment and management of neuropacemaker (brain) (peripheral nerve) (spinal cord): Secondary | ICD-10-CM | POA: Insufficient documentation

## 2017-04-12 DIAGNOSIS — M5416 Radiculopathy, lumbar region: Secondary | ICD-10-CM | POA: Insufficient documentation

## 2017-04-12 DIAGNOSIS — K219 Gastro-esophageal reflux disease without esophagitis: Secondary | ICD-10-CM | POA: Diagnosis not present

## 2017-04-12 DIAGNOSIS — J449 Chronic obstructive pulmonary disease, unspecified: Secondary | ICD-10-CM | POA: Diagnosis not present

## 2017-04-12 DIAGNOSIS — M961 Postlaminectomy syndrome, not elsewhere classified: Secondary | ICD-10-CM | POA: Diagnosis not present

## 2017-04-12 DIAGNOSIS — J45909 Unspecified asthma, uncomplicated: Secondary | ICD-10-CM | POA: Diagnosis not present

## 2017-04-12 DIAGNOSIS — Z791 Long term (current) use of non-steroidal anti-inflammatories (NSAID): Secondary | ICD-10-CM | POA: Insufficient documentation

## 2017-04-12 DIAGNOSIS — M549 Dorsalgia, unspecified: Secondary | ICD-10-CM | POA: Diagnosis not present

## 2017-04-12 DIAGNOSIS — Z79899 Other long term (current) drug therapy: Secondary | ICD-10-CM | POA: Insufficient documentation

## 2017-04-12 DIAGNOSIS — Z9689 Presence of other specified functional implants: Secondary | ICD-10-CM | POA: Diagnosis not present

## 2017-04-12 DIAGNOSIS — Z794 Long term (current) use of insulin: Secondary | ICD-10-CM | POA: Insufficient documentation

## 2017-04-12 DIAGNOSIS — E119 Type 2 diabetes mellitus without complications: Secondary | ICD-10-CM | POA: Diagnosis not present

## 2017-04-12 HISTORY — PX: SPINAL CORD STIMULATOR REMOVAL: SHX5379

## 2017-04-12 LAB — GLUCOSE, CAPILLARY
Glucose-Capillary: 175 mg/dL — ABNORMAL HIGH (ref 65–99)
Glucose-Capillary: 150 mg/dL — ABNORMAL HIGH (ref 65–99)
Glucose-Capillary: 164 mg/dL — ABNORMAL HIGH (ref 65–99)

## 2017-04-12 SURGERY — LUMBAR SPINAL CORD STIMULATOR REMOVAL
Anesthesia: General | Site: Back

## 2017-04-12 MED ORDER — SUGAMMADEX SODIUM 200 MG/2ML IV SOLN
INTRAVENOUS | Status: AC
  Filled 2017-04-12: qty 2

## 2017-04-12 MED ORDER — PROMETHAZINE HCL 25 MG/ML IJ SOLN: 6.2500 mg | INTRAMUSCULAR | Status: DC | PRN

## 2017-04-12 MED ORDER — FENTANYL CITRATE (PF) 250 MCG/5ML IJ SOLN
INTRAMUSCULAR | Status: AC
  Filled 2017-04-12: qty 5

## 2017-04-12 MED ORDER — PROPOFOL 10 MG/ML IV BOLUS
INTRAVENOUS | Status: DC | PRN
  Administered 2017-04-12: 120 mg via INTRAVENOUS

## 2017-04-12 MED ORDER — SUGAMMADEX SODIUM 200 MG/2ML IV SOLN
INTRAVENOUS | Status: DC | PRN
  Administered 2017-04-12: 200 mg via INTRAVENOUS

## 2017-04-12 MED ORDER — OXYCODONE-ACETAMINOPHEN 7.5-325 MG PO TABS: 1.0000 | ORAL_TABLET | ORAL | 0 refills | Status: DC | PRN

## 2017-04-12 MED ORDER — LACTATED RINGERS IV SOLN
INTRAVENOUS | Status: DC
  Administered 2017-04-12 (×2): via INTRAVENOUS

## 2017-04-12 MED ORDER — LIDOCAINE-EPINEPHRINE 1 %-1:100000 IJ SOLN
INTRAMUSCULAR | Status: AC
  Filled 2017-04-12: qty 1

## 2017-04-12 MED ORDER — MIDAZOLAM HCL 5 MG/5ML IJ SOLN
INTRAMUSCULAR | Status: DC | PRN
  Administered 2017-04-12: 2 mg via INTRAVENOUS

## 2017-04-12 MED ORDER — HYDROMORPHONE HCL 1 MG/ML IJ SOLN: 0.2500 mg | INTRAMUSCULAR | Status: DC | PRN

## 2017-04-12 MED ORDER — CHLORHEXIDINE GLUCONATE CLOTH 2 % EX PADS: 6.0000 | MEDICATED_PAD | Freq: Once | CUTANEOUS | Status: DC

## 2017-04-12 MED ORDER — DEXAMETHASONE SODIUM PHOSPHATE 10 MG/ML IJ SOLN
INTRAMUSCULAR | Status: AC
  Filled 2017-04-12: qty 1

## 2017-04-12 MED ORDER — ROCURONIUM BROMIDE 100 MG/10ML IV SOLN
INTRAVENOUS | Status: DC | PRN
  Administered 2017-04-12: 40 mg via INTRAVENOUS

## 2017-04-12 MED ORDER — DEXAMETHASONE SODIUM PHOSPHATE 10 MG/ML IJ SOLN
INTRAMUSCULAR | Status: DC | PRN
Start: 2017-04-12 — End: 2017-04-12
  Administered 2017-04-12: 5 mg via INTRAVENOUS

## 2017-04-12 MED ORDER — LIDOCAINE HCL (CARDIAC) 20 MG/ML IV SOLN
INTRAVENOUS | Status: DC | PRN
  Administered 2017-04-12: 40 mg via INTRAVENOUS

## 2017-04-12 MED ORDER — ONDANSETRON HCL 4 MG/2ML IJ SOLN
INTRAMUSCULAR | Status: DC | PRN
Start: 2017-04-12 — End: 2017-04-12
  Administered 2017-04-12: 4 mg via INTRAVENOUS

## 2017-04-12 MED ORDER — CEFAZOLIN SODIUM 1 G IJ SOLR
INTRAMUSCULAR | Status: AC
  Filled 2017-04-12: qty 20

## 2017-04-12 MED ORDER — LACTATED RINGERS IV SOLN: INTRAVENOUS | Status: DC

## 2017-04-12 MED ORDER — PROPOFOL 10 MG/ML IV BOLUS
INTRAVENOUS | Status: AC
  Filled 2017-04-12: qty 20

## 2017-04-12 MED ORDER — LIDOCAINE-EPINEPHRINE 1 %-1:100000 IJ SOLN
INTRAMUSCULAR | Status: DC | PRN
  Administered 2017-04-12: 10 mL

## 2017-04-12 MED ORDER — 0.9 % SODIUM CHLORIDE (POUR BTL) OPTIME
TOPICAL | Status: DC | PRN
  Administered 2017-04-12: 1000 mL

## 2017-04-12 MED ORDER — MEPERIDINE HCL 25 MG/ML IJ SOLN: 6.2500 mg | INTRAMUSCULAR | Status: DC | PRN

## 2017-04-12 MED ORDER — ONDANSETRON HCL 4 MG/2ML IJ SOLN
INTRAMUSCULAR | Status: AC
  Filled 2017-04-12: qty 2

## 2017-04-12 MED ORDER — FENTANYL CITRATE (PF) 100 MCG/2ML IJ SOLN
INTRAMUSCULAR | Status: DC | PRN
  Administered 2017-04-12: 50 ug via INTRAVENOUS

## 2017-04-12 MED ORDER — MIDAZOLAM HCL 2 MG/2ML IJ SOLN
INTRAMUSCULAR | Status: AC
  Filled 2017-04-12: qty 2

## 2017-04-12 MED ORDER — LIDOCAINE 2% (20 MG/ML) 5 ML SYRINGE
INTRAMUSCULAR | Status: AC
  Filled 2017-04-12: qty 5

## 2017-04-12 SURGICAL SUPPLY — 42 items
ADH SKN CLS APL DERMABOND .7 (GAUZE/BANDAGES/DRESSINGS) ×1
BLADE CLIPPER SURG (BLADE) IMPLANT
CARTRIDGE OIL MAESTRO DRILL (MISCELLANEOUS) ×1 IMPLANT
CHLORAPREP W/TINT 26ML (MISCELLANEOUS) ×2 IMPLANT
DERMABOND ADVANCED (GAUZE/BANDAGES/DRESSINGS) ×1
DERMABOND ADVANCED .7 DNX12 (GAUZE/BANDAGES/DRESSINGS) ×1 IMPLANT
DIFFUSER DRILL AIR PNEUMATIC (MISCELLANEOUS) ×1 IMPLANT
DRAPE LAPAROTOMY 100X72X124 (DRAPES) ×2 IMPLANT
DRSG OPSITE POSTOP 3X4 (GAUZE/BANDAGES/DRESSINGS) ×4 IMPLANT
ELECT REM PT RETURN 9FT ADLT (ELECTROSURGICAL) ×2
ELECTRODE REM PT RTRN 9FT ADLT (ELECTROSURGICAL) ×1 IMPLANT
GLOVE BIOGEL PI IND STRL 7.0 (GLOVE) IMPLANT
GLOVE BIOGEL PI IND STRL 7.5 (GLOVE) ×1 IMPLANT
GLOVE BIOGEL PI INDICATOR 7.0 (GLOVE) ×1
GLOVE BIOGEL PI INDICATOR 7.5 (GLOVE) ×1
GLOVE ECLIPSE 7.5 STRL STRAW (GLOVE) ×2 IMPLANT
GLOVE EXAM NITRILE LRG STRL (GLOVE) IMPLANT
GLOVE EXAM NITRILE XL STR (GLOVE) IMPLANT
GLOVE EXAM NITRILE XS STR PU (GLOVE) IMPLANT
GLOVE SURG SS PI 7.0 STRL IVOR (GLOVE) ×1 IMPLANT
GOWN STRL REUS W/ TWL LRG LVL3 (GOWN DISPOSABLE) IMPLANT
GOWN STRL REUS W/TWL LRG LVL3 (GOWN DISPOSABLE) ×2
KIT BASIN OR (CUSTOM PROCEDURE TRAY) ×2 IMPLANT
KIT ROOM TURNOVER OR (KITS) ×2 IMPLANT
NDL HYPO 25X1 1.5 SAFETY (NEEDLE) ×1 IMPLANT
NEEDLE HYPO 25X1 1.5 SAFETY (NEEDLE) ×2 IMPLANT
NS IRRIG 1000ML POUR BTL (IV SOLUTION) ×2 IMPLANT
OIL CARTRIDGE MAESTRO DRILL (MISCELLANEOUS)
PACK LAMINECTOMY NEURO (CUSTOM PROCEDURE TRAY) ×2 IMPLANT
PAD ARMBOARD 7.5X6 YLW CONV (MISCELLANEOUS) ×4 IMPLANT
SPONGE LAP 4X18 X RAY DECT (DISPOSABLE) IMPLANT
STAPLER SKIN PROX WIDE 3.9 (STAPLE) ×2 IMPLANT
SUT MNCRL AB 4-0 PS2 18 (SUTURE) ×2 IMPLANT
SUT SILK 2 0 FS (SUTURE) IMPLANT
SUT VIC AB 2-0 CP2 18 (SUTURE) ×4 IMPLANT
SUT VIC AB 2-0 CT1 18 (SUTURE) IMPLANT
SYR 10ML LL (SYRINGE) ×1 IMPLANT
SYR EPIDURAL 5ML GLASS (SYRINGE) IMPLANT
TOWEL GREEN STERILE (TOWEL DISPOSABLE) ×2 IMPLANT
TOWEL GREEN STERILE FF (TOWEL DISPOSABLE) ×2 IMPLANT
WATER STERILE IRR 1000ML POUR (IV SOLUTION) ×2 IMPLANT
YANKAUER SUCT BULB TIP NO VENT (SUCTIONS) ×2 IMPLANT

## 2017-04-12 NOTE — Op Note (Signed)
PREOP DX: 1) lumbago  2) lumbar radiculopathy  3) failure of SCS IPG 4) chronic pain  POSTOP DX: same as preop PROCEDURES PERFORMED:1) removal of SCS system SURGEON:Alixis Harmon  ASSISTANT: NONE  ANESTHESIA: GETA EBL: <20cc  DESCRIPTION OF PROCEDURE: After a discussion of risks, benefits and alternatives, informed consent was obtained. The patient was taken to the OR, general anesthesia induced by the anesthesia team without difficulty, turned prone onto a Jackson table, all pressure points padded, SCD's placed. A timeout was taken to verify the correct patient, position, personnel, availability of appropriate equipment, and administration of perioperative antibiotics.  The thoracic and lumbar areas were widely prepped with chloraprep and draped into a sterile field. The skin and subcutaneous tissues around the patient's previous incisions were infiltrated with 0.25% bupivicaine 1:200K epinephrine. The subcutaneous pocket was incised with a 10 blade and using sharp, careful dissection the pocket opened and the IPG delivered onto the field. The pocket was inspected for hemostasis, which was found to be excellent. The leads were then exposed and delivered en toto. There was a distinct fracture of the lead anchor, but all was removed easily.There was no bleeding or other fluid noted.   Both incisions were copiously irrigated with bacitracin-containing irrigation. The lumbar incision was closed in 2 deep layers of interrupted 2-0 vicryl and the skin closed with a running 3-0 monocryl subcuticular suture and dermabond. The pocket incision was closed with a deeper layer of 2-0 vicryl interrupted sutures, and the skin closed with a running 3-0 monocryl subcuticular suture and dermabond. Sterile dressings were applied. Needle, sponge, and instrument counts were correct x2 at the end of the case.  The patient was then carefully awakened from anesthesia, turned supine, an abdominal binder placed, and the  patient taken to the recovery room. COMPLICATIONS: NONE  CONDITION: Stable throughout the course of the procedure and immediately afterward  DISPOSITION: discharge to home. Discussed care with the patient and spouse. Followup in clinic will be scheduled in 10-14 days

## 2017-04-12 NOTE — H&P (Signed)
Paula Hamilton is an 66 y.o. female.   Chief Complaint: Nonfunctioning spinal cord stimulator HPI:  Patient in whom I implanted a [employer]ott SCS system 3 years ago, with  Development of shocking sensations, poor performance of her SCS over the last few months to a year.  More recently she developed a 3rd nerve palsy, for which she has seen the neurologists at [facility].  They wanted to a brain MRI, but can't get the battery to enter into an MRI compatible mode.  Given that she has been having poor performance, shocking sensations, she has requested removal of her system so that she can undergo MRI  Past Medical History:  Diagnosis Date  . 3rd cranial nerve palsy, right    just start 03/13/2017  . Anxiety   . Arthritis    listhesis, instability  . Asthma   . Bronchitis    just finished Z pack and is taking cough syrup with Codiene for cough  . COPD (chronic obstructive pulmonary disease) ([place])   . Depression   . Diabetes mellitus without complication (HCC)    fasting 270s  . Fibromyalgia   . GERD (gastroesophageal reflux disease)   . Headache(784.0)    last migraine > 1 yr.   Marland Kitchen Heart murmur    mentioned yrs. ago, never mentioned again.   Marland Kitchen Hx of migraines   . Mental disorder   . Neuropathy   . Pneumonia    6 years ago  . PONV (postoperative nausea and vomiting)    has had other surgeries with no problems  . Shortness of breath     Past Surgical History:  Procedure Laterality Date  . ABDOMINAL HYSTERECTOMY    . APPENDECTOMY    . BACK SURGERY    . CERVICAL FUSION    . COLONOSCOPY W/ BIOPSIES AND POLYPECTOMY     benign  . EYE SURGERY     laser surgery on left eye   . MAXIMUM ACCESS (MAS)POSTERIOR LUMBAR INTERBODY FUSION (PLIF) 1 LEVEL N/A 04/12/2013   Procedure: Lumba four-five maximum access surgery Posterior Lumbar Interbody Fusion;  Surgeon: Eustace Moore, MD;  Location: [facility];  Service: Neurosurgery;  Laterality: N/A;  Lumba four-five maximum access  surgery Posterior Lumbar Interbody Fusion  . SPINAL CORD STIMULATOR INSERTION N/A 06/14/2014   Procedure: LUMBAR SPINAL CORD STIMULATOR INSERTION;  Surgeon: Bonna Gains, MD;  Location: [facility];  Service: Neurosurgery;  Laterality: N/A;  . TUBAL LIGATION      No family history on file. Social History:  reports that she quit smoking about 5 years ago. Her smoking use included Cigarettes. She has a 30.00 pack-year smoking history. She has never used smokeless tobacco. She reports that she does not drink alcohol or use drugs.  Allergies:  Allergies  Allergen Reactions  . Codeine Rash and Swelling  . Penicillins Rash    PATIENT HAS HAD A PCN REACTION WITH IMMEDIATE RASH, FACIAL/TONGUE/THROAT SWELLING, SOB, OR LIGHTHEADEDNESS WITH HYPOTENSION:  #  #  #  YES  #  #  #   Has patient had a PCN reaction causing severe rash involving mucus membranes or skin necrosis: No Has patient had a PCN reaction that required hospitalization: No Has patient had a PCN reaction occurring within the last 10 years: No   . Pregabalin Other (See Comments)    Suicidal thoughts  . Erythromycin Base Nausea And Vomiting    STOMACH PAIN  . Morphine And Related Nausea And Vomiting  Medications Prior to Admission  Medication Sig Dispense Refill  . acetaminophen (TYLENOL) 500 MG tablet Take 1,000 mg by mouth every 6 (six) hours as needed for mild pain.    Marland Kitchen albuterol (PROVENTIL HFA;VENTOLIN HFA) 108 (90 BASE) MCG/ACT inhaler Inhale 2 puffs into the lungs every 6 (six) hours as needed for wheezing.    Marland Kitchen aspirin EC 81 MG tablet Take 81 mg by mouth daily.    . diphenhydrAMINE (BENADRYL) 25 mg capsule Take 25 mg by mouth at bedtime.    . docusate sodium (COLACE) 100 MG capsule Take 100 mg by mouth daily as needed for mild constipation.     . DULoxetine (CYMBALTA) 60 MG capsule Take 60 mg by mouth 2 (two) times daily.     . Insulin Degludec-Liraglutide (XULTOPHY) 100-3.6 UNIT-MG/ML SOPN Inject 25 Units into the  skin daily.    Marland Kitchen morphine (MS CONTIN) 15 MG 12 hr tablet Take 15 mg by mouth 3 (three) times daily.     Marland Kitchen omeprazole (PRILOSEC) 40 MG capsule Take 40 mg by mouth daily.    . rosuvastatin (CRESTOR) 10 MG tablet Take 10 mg by mouth every evening.     . clindamycin (CLEOCIN) 150 MG capsule Take 1 capsule (150 mg total) by mouth 3 (three) times daily. (Patient not taking: Reported on 04/06/2017) 30 capsule 0  . clindamycin (CLEOCIN) 150 MG capsule Take 1 capsule (150 mg total) by mouth 3 (three) times daily. (Patient not taking: Reported on 04/06/2017) 30 capsule 0  . Oxycodone HCl 10 MG TABS Take 1 tablet (10 mg total) by mouth every 4 (four) hours as needed (pain). (Patient not taking: Reported on 04/06/2017) 30 tablet 0    Results for orders placed or performed during the hospital encounter of 04/11/17 (from the past 48 hour(s))  Surgical pcr screen     Status: None   Collection Time: 04/11/17  8:52 AM  Result Value Ref Range   MRSA, PCR NEGATIVE NEGATIVE   Staphylococcus aureus NEGATIVE NEGATIVE    Comment: (NOTE) The Xpert SA Assay (FDA approved for NASAL specimens in patients 54 years of age and older), is one component of a comprehensive surveillance program. It is not intended to diagnose infection nor to guide or monitor treatment.   Basic metabolic panel     Status: Abnormal   Collection Time: 04/11/17  8:53 AM  Result Value Ref Range   Sodium 140 135 - 145 mmol/L   Potassium 4.4 3.5 - 5.1 mmol/L   Chloride 104 101 - 111 mmol/L   CO2 29 22 - 32 mmol/L   Glucose, Bld 205 (H) 65 - 99 mg/dL   BUN 19 6 - 20 mg/dL   Creatinine, Ser 0.84 0.44 - 1.00 mg/dL   Calcium 8.9 8.9 - 10.3 mg/dL   GFR calc non Af Amer >60 >60 mL/min   GFR calc Af Amer >60 >60 mL/min    Comment: (NOTE) The eGFR has been calculated using the CKD EPI equation. This calculation has not been validated in all clinical situations. eGFR's persistently <60 mL/min signify possible Chronic Kidney Disease.    Anion  gap 7 5 - 15  CBC     Status: Abnormal   Collection Time: 04/11/17  8:53 AM  Result Value Ref Range   WBC 11.8 (H) 4.0 - 10.5 K/uL   RBC 4.64 3.87 - 5.11 MIL/uL   Hemoglobin 14.8 12.0 - 15.0 g/dL   HCT 44.3 36.0 - 46.0 %   MCV 95.5  78.0 - 100.0 fL   MCH 31.9 26.0 - 34.0 pg   MCHC 33.4 30.0 - 36.0 g/dL   RDW 13.8 11.5 - 15.5 %   Platelets 293 150 - 400 K/uL  Hemoglobin A1c     Status: Abnormal   Collection Time: 04/11/17  8:53 AM  Result Value Ref Range   Hgb A1c MFr Bld 7.1 (H) 4.8 - 5.6 %    Comment: (NOTE) Pre diabetes:          5.7%-6.4% Diabetes:              >6.4% Glycemic control for   <7.0% adults with diabetes    Mean Plasma Glucose 157.07 mg/dL  Glucose, capillary     Status: Abnormal   Collection Time: 04/11/17  8:56 AM  Result Value Ref Range   Glucose-Capillary 205 (H) 65 - 99 mg/dL   Comment 1 Notify RN    Comment 2 Document in Chart    No results found.  Review of Systems  Constitutional: Negative.   HENT: Negative.   Eyes: Positive for double vision.  Respiratory: Negative.   Cardiovascular: Negative.   Gastrointestinal: Negative.   Genitourinary: Negative.   Musculoskeletal: Positive for back pain. Negative for falls, joint pain and myalgias.  Skin: Negative.   Neurological: Negative.   Endo/Heme/Allergies: Negative.   Psychiatric/Behavioral: Negative.     Blood pressure (!) 173/68, pulse 88, temperature 98.2 F (36.8 C), temperature source Oral, resp. rate 18, height 5' 3" (1.6 m), weight 61 kg (134 lb 8 oz), SpO2 99 %. Physical Exam  Constitutional: She is oriented to person, place, and time. She appears well-developed and well-nourished.  HENT:  Head: Normocephalic and atraumatic.  Eyes: Conjunctivae are normal.  Right 3rd nerve palsy  Neck: Normal range of motion.  Cardiovascular: Normal rate.   Respiratory: Effort normal.  Musculoskeletal: Normal range of motion.  Neurological: She is alert and oriented to person, place, and time. A  cranial nerve deficit is present.  Skin: Skin is warm.  Psychiatric: She has a normal mood and affect. Her behavior is normal. Thought content normal.     Assessment/Plan  failure of SCS system  Plan: Remove SCS  Bonna Gains, MD 04/12/2017, 12:21 PM

## 2017-04-12 NOTE — Discharge Instructions (Addendum)

## 2017-04-12 NOTE — Transfer of Care (Signed)
Immediate Anesthesia Transfer of Care Note  Patient: Paula Hamilton  Procedure(s) Performed: LUMBAR SPINAL CORD STIMULATOR REMOVAL (N/A Back)  Patient Location: PACU  Anesthesia Type:General  Level of Consciousness: awake, oriented and patient cooperative  Airway & Oxygen Therapy: Patient Spontanous Breathing and Patient connected to face mask oxygen  Post-op Assessment: Report given to RN and Post -op Vital signs reviewed and stable  Post vital signs: Reviewed  Last Vitals:  Vitals:   04/12/17 1001  BP: (!) 173/68  Pulse: 88  Resp: 18  Temp: 36.8 C  SpO2: 99%    Last Pain:  Vitals:   04/12/17 1022  TempSrc:   PainSc: 7       Patients Stated Pain Goal: 4 (04/12/17 1022)  Complications: No apparent anesthesia complications

## 2017-04-12 NOTE — Progress Notes (Signed)
Orthopedic Tech Progress Note Patient Details:  Paula Hamilton 02-25-1951 [id number]  Ortho Devices Type of Ortho Device: Abdominal binder Ortho Device/Splint Location: applied abdominal binder to pt.  pt tolerated application very well.  Ortho Device/Splint Interventions: Application, Adjustment   Alvina Chou 04/12/2017, 2:47 PM

## 2017-04-12 NOTE — Anesthesia Preprocedure Evaluation (Addendum)
Anesthesia Evaluation  Patient identified by MRN, date of birth, ID band Patient awake    Reviewed: Allergy & Precautions, Patient's Chart, lab work & pertinent test results  History of Anesthesia Complications (+) PONV and history of anesthetic complications  Airway Mallampati: I  TM Distance: >3 FB Neck ROM: Full    Dental  (+) Edentulous Upper, Edentulous Lower, Dental Advisory Given   Pulmonary asthma , COPD, former smoker,    breath sounds clear to auscultation       Cardiovascular negative cardio ROS  + Valvular Problems/Murmurs  Rhythm:Regular Rate:Normal     Neuro/Psych  Headaches, PSYCHIATRIC DISORDERS Anxiety Depression 3rd cranial nerve palsy right eye.  Neuromuscular disease    GI/Hepatic Neg liver ROS, GERD  Medicated,  Endo/Other  diabetes, Type 2, Insulin Dependent  Renal/GU negative Renal ROS     Musculoskeletal negative musculoskeletal ROS (+) Arthritis , Fibromyalgia -  Abdominal   Peds  Hematology negative hematology ROS (+)   Anesthesia Other Findings Day of surgery medications reviewed with the patient.  Reproductive/Obstetrics                           EKG: normal sinus rhythm.  Anesthesia Physical Anesthesia Plan  ASA: III  Anesthesia Plan: General   Post-op Pain Management:    Induction: Intravenous  PONV Risk Score and Plan: 4 or greater and Ondansetron, Dexamethasone, Midazolam, Scopolamine patch - Pre-op and Treatment may vary due to age or medical condition  Airway Management Planned: Oral ETT  Additional Equipment:   Intra-op Plan:   Post-operative Plan: Extubation in OR  Informed Consent: I have reviewed the patients History and Physical, chart, labs and discussed the procedure including the risks, benefits and alternatives for the proposed anesthesia with the patient or authorized representative who has indicated his/her understanding and  acceptance.   Dental advisory given  Plan Discussed with: CRNA  Anesthesia Plan Comments:         Anesthesia Quick Evaluation

## 2017-04-12 NOTE — Anesthesia Procedure Notes (Signed)
Procedure Name: Intubation Date/Time: 04/12/2017 12:36 PM Performed by: Lovie Chol Pre-anesthesia Checklist: Patient identified, Emergency Drugs available, Suction available and Patient being monitored Patient Re-evaluated:Patient Re-evaluated prior to induction Oxygen Delivery Method: Circle System Utilized Preoxygenation: Pre-oxygenation with 100% oxygen Induction Type: IV induction Ventilation: Mask ventilation without difficulty Laryngoscope Size: Miller and 2 Grade View: Grade I Tube type: Oral Tube size: 7.0 mm Number of attempts: 1 Airway Equipment and Method: Stylet and Oral airway Placement Confirmation: ETT inserted through vocal cords under direct vision,  positive ETCO2 and breath sounds checked- equal and bilateral Secured at: 21 cm Tube secured with: Tape Dental Injury: Teeth and Oropharynx as per pre-operative assessment

## 2017-04-13 ENCOUNTER — Encounter (HOSPITAL_COMMUNITY): Payer: Self-pay | Admitting: Anesthesiology

## 2017-04-13 NOTE — Anesthesia Postprocedure Evaluation (Signed)
Anesthesia Post Note  Patient: Paula Hamilton  Procedure(s) Performed: LUMBAR SPINAL CORD STIMULATOR REMOVAL (N/A Back)     Patient location during evaluation: PACU Anesthesia Type: General Level of consciousness: awake and alert Pain management: pain level controlled Vital Signs Assessment: post-procedure vital signs reviewed and stable Respiratory status: spontaneous breathing, nonlabored ventilation, respiratory function stable and patient connected to nasal cannula oxygen Cardiovascular status: blood pressure returned to baseline and stable Postop Assessment: no apparent nausea or vomiting Anesthetic complications: no    Last Vitals:  Vitals:   04/12/17 1408 04/12/17 1415  BP: (!) 148/60 105/67  Pulse: 87 89  Resp: 16 17  Temp: 36.7 C   SpO2: 98% 98%    Last Pain:  Vitals:   04/12/17 1408  TempSrc:   PainSc: 2                  Shelton Silvas

## 2017-04-14 DIAGNOSIS — Z5181 Encounter for therapeutic drug level monitoring: Secondary | ICD-10-CM | POA: Diagnosis not present

## 2017-04-14 DIAGNOSIS — Z23 Encounter for immunization: Secondary | ICD-10-CM | POA: Diagnosis not present

## 2017-04-14 DIAGNOSIS — Z79899 Other long term (current) drug therapy: Secondary | ICD-10-CM | POA: Diagnosis not present

## 2017-04-14 DIAGNOSIS — E7800 Pure hypercholesterolemia, unspecified: Secondary | ICD-10-CM | POA: Diagnosis not present

## 2017-04-14 DIAGNOSIS — E11319 Type 2 diabetes mellitus with unspecified diabetic retinopathy without macular edema: Secondary | ICD-10-CM | POA: Diagnosis not present

## 2017-04-14 DIAGNOSIS — Z1389 Encounter for screening for other disorder: Secondary | ICD-10-CM | POA: Diagnosis not present

## 2017-04-14 DIAGNOSIS — J4520 Mild intermittent asthma, uncomplicated: Secondary | ICD-10-CM | POA: Diagnosis not present

## 2017-04-14 DIAGNOSIS — Z0000 Encounter for general adult medical examination without abnormal findings: Secondary | ICD-10-CM | POA: Diagnosis not present

## 2017-04-14 DIAGNOSIS — Z87891 Personal history of nicotine dependence: Secondary | ICD-10-CM | POA: Diagnosis not present

## 2017-04-15 DIAGNOSIS — E7800 Pure hypercholesterolemia, unspecified: Secondary | ICD-10-CM | POA: Diagnosis not present

## 2017-04-15 DIAGNOSIS — E11319 Type 2 diabetes mellitus with unspecified diabetic retinopathy without macular edema: Secondary | ICD-10-CM | POA: Diagnosis not present

## 2017-04-15 DIAGNOSIS — J4520 Mild intermittent asthma, uncomplicated: Secondary | ICD-10-CM | POA: Diagnosis not present

## 2017-04-15 DIAGNOSIS — H4900 Third [oculomotor] nerve palsy, unspecified eye: Secondary | ICD-10-CM | POA: Diagnosis not present

## 2017-04-15 DIAGNOSIS — E1165 Type 2 diabetes mellitus with hyperglycemia: Secondary | ICD-10-CM | POA: Diagnosis not present

## 2017-04-15 DIAGNOSIS — G894 Chronic pain syndrome: Secondary | ICD-10-CM | POA: Diagnosis not present

## 2017-04-15 DIAGNOSIS — K219 Gastro-esophageal reflux disease without esophagitis: Secondary | ICD-10-CM | POA: Diagnosis not present

## 2017-04-22 DIAGNOSIS — G53 Cranial nerve disorders in diseases classified elsewhere: Secondary | ICD-10-CM | POA: Diagnosis not present

## 2017-04-22 DIAGNOSIS — E1149 Type 2 diabetes mellitus with other diabetic neurological complication: Secondary | ICD-10-CM | POA: Diagnosis not present

## 2017-04-25 DIAGNOSIS — Z1231 Encounter for screening mammogram for malignant neoplasm of breast: Secondary | ICD-10-CM | POA: Diagnosis not present

## 2017-05-16 DIAGNOSIS — H2513 Age-related nuclear cataract, bilateral: Secondary | ICD-10-CM | POA: Diagnosis not present

## 2017-05-16 DIAGNOSIS — E113393 Type 2 diabetes mellitus with moderate nonproliferative diabetic retinopathy without macular edema, bilateral: Secondary | ICD-10-CM | POA: Diagnosis not present

## 2017-05-16 DIAGNOSIS — H532 Diplopia: Secondary | ICD-10-CM | POA: Diagnosis not present

## 2017-11-07 DIAGNOSIS — E1165 Type 2 diabetes mellitus with hyperglycemia: Secondary | ICD-10-CM | POA: Diagnosis not present

## 2018-02-10 DIAGNOSIS — E1165 Type 2 diabetes mellitus with hyperglycemia: Secondary | ICD-10-CM | POA: Diagnosis not present

## 2018-02-10 DIAGNOSIS — E7800 Pure hypercholesterolemia, unspecified: Secondary | ICD-10-CM | POA: Diagnosis not present

## 2018-02-10 DIAGNOSIS — I10 Essential (primary) hypertension: Secondary | ICD-10-CM | POA: Diagnosis not present

## 2018-02-10 DIAGNOSIS — E1140 Type 2 diabetes mellitus with diabetic neuropathy, unspecified: Secondary | ICD-10-CM | POA: Diagnosis not present

## 2018-03-04 DIAGNOSIS — R197 Diarrhea, unspecified: Secondary | ICD-10-CM | POA: Diagnosis not present

## 2018-03-04 DIAGNOSIS — Z01810 Encounter for preprocedural cardiovascular examination: Secondary | ICD-10-CM | POA: Diagnosis not present

## 2018-03-04 DIAGNOSIS — N179 Acute kidney failure, unspecified: Secondary | ICD-10-CM | POA: Diagnosis not present

## 2018-03-04 DIAGNOSIS — Z01812 Encounter for preprocedural laboratory examination: Secondary | ICD-10-CM | POA: Diagnosis not present

## 2018-03-04 DIAGNOSIS — I959 Hypotension, unspecified: Secondary | ICD-10-CM | POA: Diagnosis not present

## 2018-03-04 DIAGNOSIS — Z0183 Encounter for blood typing: Secondary | ICD-10-CM | POA: Diagnosis not present

## 2018-03-04 DIAGNOSIS — I350 Nonrheumatic aortic (valve) stenosis: Secondary | ICD-10-CM | POA: Diagnosis not present

## 2018-03-05 DIAGNOSIS — I10 Essential (primary) hypertension: Secondary | ICD-10-CM | POA: Diagnosis not present

## 2018-03-05 DIAGNOSIS — I959 Hypotension, unspecified: Secondary | ICD-10-CM | POA: Diagnosis not present

## 2018-03-05 DIAGNOSIS — E119 Type 2 diabetes mellitus without complications: Secondary | ICD-10-CM | POA: Diagnosis not present

## 2018-03-05 DIAGNOSIS — Z79899 Other long term (current) drug therapy: Secondary | ICD-10-CM | POA: Diagnosis not present

## 2018-03-05 DIAGNOSIS — N179 Acute kidney failure, unspecified: Secondary | ICD-10-CM | POA: Diagnosis not present

## 2018-03-05 DIAGNOSIS — E869 Volume depletion, unspecified: Secondary | ICD-10-CM | POA: Diagnosis not present

## 2018-03-05 DIAGNOSIS — E1165 Type 2 diabetes mellitus with hyperglycemia: Secondary | ICD-10-CM | POA: Diagnosis not present

## 2018-03-05 DIAGNOSIS — K529 Noninfective gastroenteritis and colitis, unspecified: Secondary | ICD-10-CM | POA: Diagnosis not present

## 2018-03-06 DIAGNOSIS — E119 Type 2 diabetes mellitus without complications: Secondary | ICD-10-CM | POA: Diagnosis not present

## 2018-03-06 DIAGNOSIS — K529 Noninfective gastroenteritis and colitis, unspecified: Secondary | ICD-10-CM | POA: Diagnosis not present

## 2018-03-06 DIAGNOSIS — I959 Hypotension, unspecified: Secondary | ICD-10-CM | POA: Diagnosis not present

## 2018-03-06 DIAGNOSIS — N179 Acute kidney failure, unspecified: Secondary | ICD-10-CM | POA: Diagnosis not present

## 2018-04-03 DIAGNOSIS — R109 Unspecified abdominal pain: Secondary | ICD-10-CM | POA: Diagnosis not present

## 2018-04-03 DIAGNOSIS — R197 Diarrhea, unspecified: Secondary | ICD-10-CM | POA: Diagnosis not present

## 2018-04-10 DIAGNOSIS — R197 Diarrhea, unspecified: Secondary | ICD-10-CM | POA: Diagnosis not present

## 2018-05-01 DIAGNOSIS — H903 Sensorineural hearing loss, bilateral: Secondary | ICD-10-CM | POA: Diagnosis not present

## 2018-05-19 DIAGNOSIS — E1165 Type 2 diabetes mellitus with hyperglycemia: Secondary | ICD-10-CM | POA: Diagnosis not present

## 2018-05-19 DIAGNOSIS — E11319 Type 2 diabetes mellitus with unspecified diabetic retinopathy without macular edema: Secondary | ICD-10-CM | POA: Diagnosis not present

## 2018-05-19 DIAGNOSIS — G894 Chronic pain syndrome: Secondary | ICD-10-CM | POA: Diagnosis not present

## 2018-05-19 DIAGNOSIS — I10 Essential (primary) hypertension: Secondary | ICD-10-CM | POA: Diagnosis not present

## 2018-05-19 DIAGNOSIS — E7800 Pure hypercholesterolemia, unspecified: Secondary | ICD-10-CM | POA: Diagnosis not present

## 2018-05-19 DIAGNOSIS — E1140 Type 2 diabetes mellitus with diabetic neuropathy, unspecified: Secondary | ICD-10-CM | POA: Diagnosis not present

## 2018-05-19 DIAGNOSIS — Z23 Encounter for immunization: Secondary | ICD-10-CM | POA: Diagnosis not present

## 2018-05-19 DIAGNOSIS — R319 Hematuria, unspecified: Secondary | ICD-10-CM | POA: Diagnosis not present

## 2018-06-06 DIAGNOSIS — R319 Hematuria, unspecified: Secondary | ICD-10-CM | POA: Diagnosis not present

## 2018-06-21 DIAGNOSIS — G894 Chronic pain syndrome: Secondary | ICD-10-CM | POA: Diagnosis not present

## 2018-07-11 ENCOUNTER — Other Ambulatory Visit (INDEPENDENT_AMBULATORY_CARE_PROVIDER_SITE_OTHER): Payer: PPO

## 2018-07-11 ENCOUNTER — Encounter: Payer: Self-pay | Admitting: Gastroenterology

## 2018-07-11 ENCOUNTER — Ambulatory Visit: Payer: PPO | Admitting: Gastroenterology

## 2018-07-11 VITALS — BP 142/80 | HR 119 | Ht 64.0 in | Wt 158.1 lb

## 2018-07-11 DIAGNOSIS — R197 Diarrhea, unspecified: Secondary | ICD-10-CM | POA: Diagnosis not present

## 2018-07-11 MED ORDER — DIPHENOXYLATE-ATROPINE 2.5-0.025 MG PO TABS: 1.0000 | ORAL_TABLET | Freq: Three times a day (TID) | ORAL | 1 refills | Status: AC | PRN

## 2018-07-11 NOTE — Patient Instructions (Addendum)
If you are age 66 or older, your body mass index should be between 23-30. Your Body mass index is 27.14 kg/m. If this is out of the aforementioned range listed, please consider follow up with your Primary Care Provider.  If you are age 42 or younger, your body mass index should be between 19-25. Your Body mass index is 27.14 kg/m. If this is out of the aformentioned range listed, please consider follow up with your Primary Care Provider.   Please go to the lab on the 2nd floor [address] before you leave the office today.   We have sent the following medications to your pharmacy for you to pick up at your convenience: Lomotil  You have been scheduled for a colonoscopy. Please follow written instructions given to you at your visit today.  Please pick up your prep supplies at the pharmacy within the next 1-3 days. If you use inhalers (even only as needed), please bring them with you on the day of your procedure. Your physician has requested that you go to www.[email or web address].com and enter the access code given to you at your visit today. This web site gives a general overview about your procedure. However, you should still follow specific instructions given to you by our office regarding your preparation for the procedure.  Thank you,  Dr. Lynann Bologna

## 2018-07-11 NOTE — Progress Notes (Signed)
Chief Complaint: diarrhea  Referring Provider:  Crist FatVan Eyk, Jason, MD      ASSESSMENT AND PLAN;   #1. Diarrhea. D/d includes irritable bowel syndrome with predominant diarrhea, infectious causes, diarrhea due to medications likely metformin, microscopic colitis, diabetic diarrhea, IBD, malabsorption, doubt celiac disease and hypothyroidism. Neg SB Bx for celiac on EGD 02/2016  #2. H/O TAs (colon 11/2014).  Plan: - Stool for GI pathogen, WBC, fecal elastase. - Colonoscopy for further evaluation. Discussed risks and benefits. - Lomotil 1 tid prn #60, 2 refills. - Check CBC, CMP, TSH, CRP and HBA1c. - If still with problems, trial of bentyl.   HPI:    Paula Hamilton is a 68 y.o. female  Diarrhea x June/july -  Had sepsis, [facility], treated with broad-spectrum antibiotics.  We do not have all the records. Diarrhea at the frequency of 2-3/day, watery bowel movements Nocturnal symptoms Lost 10lb since sept 2019. No melena or hematochezia Has previous history of constipation Denies having any significant abdominal pain No nausea, vomiting, heartburn, regurgitation, odynophagia or dysphagia. She has been on metformin over last several years.  Has not changed the brand.  Past GI procedures: -colon 11/21/2014 ([facility])-6 mm polyp status post polypectomy, mild sigmoid diverticulosis. -EGD 02/2016-moderate gastritis, negative small bowel biopsies.  CLO negative. Past Medical History:  Diagnosis Date  . 3rd cranial nerve palsy, right    just start 03/13/2017  . Anxiety   . Arthritis    listhesis, instability  . Asthma   . Bronchitis    just finished Z pack and is taking cough syrup with Codiene for cough  . CAD (coronary artery disease)   . COPD (chronic obstructive pulmonary disease) (HCC)   . Depression   . Diabetes mellitus without complication (HCC)    fasting 270s  . Diabetic retinopathy (HCC)   . Dysphagia   . Fibromyalgia   . GERD (gastroesophageal  reflux disease)   . Headache(784.0)    last migraine > 1 yr.   Marland Kitchen. Heart murmur    mentioned yrs. ago, never mentioned again.   Marland Kitchen. History of colon polyps   . Hx of migraines   . Hypercholesterolemia   . Hypertension   . Mental disorder    bipolar disorder  . Neuropathy   . Pneumonia    6 years ago  . PONV (postoperative nausea and vomiting)    has had other surgeries with no problems  . Shortness of breath     Past Surgical History:  Procedure Laterality Date  . ABDOMINAL HYSTERECTOMY    . APPENDECTOMY    . BACK SURGERY     lower back 4 time, tens surgery too  . CERVICAL FUSION    . COLONOSCOPY W/ BIOPSIES AND POLYPECTOMY  11/21/2014   Colonic polyp status post polypectomy. benign. Mild sigmoid diverticulosis.  Marland Kitchen. ESOPHAGOGASTRODUODENOSCOPY  02/26/2016   Moderate gastritis. Otherwise normal EGD   . ESOPHAGOGASTRODUODENOSCOPY  08/24/20117   Mioderate gastritis. OTherwise normal EGD.   Marland Kitchen. EYE SURGERY     laser surgery on left eye   . MAXIMUM ACCESS (MAS)POSTERIOR LUMBAR INTERBODY FUSION (PLIF) 1 LEVEL N/A 04/12/2013   Procedure: Lumba four-five maximum access surgery Posterior Lumbar Interbody Fusion;  Surgeon: Tia Alertavid S Jones, MD;  Location: [facility];  Service: Neurosurgery;  Laterality: N/A;  Lumba four-five maximum access surgery Posterior Lumbar Interbody Fusion  . SPINAL CORD STIMULATOR INSERTION N/A 06/14/2014   Procedure: LUMBAR SPINAL CORD STIMULATOR INSERTION;  Surgeon: Renae FicklePaul  Simone Curia, MD;  Location: [facility];  Service: Neurosurgery;  Laterality: N/A;  . SPINAL CORD STIMULATOR REMOVAL N/A 04/12/2017   Procedure: LUMBAR SPINAL CORD STIMULATOR REMOVAL;  Surgeon: Odette Fraction, MD;  Location: [facility] OR;  Service: Neurosurgery;  Laterality: N/A;  . TUBAL LIGATION      Family History  Problem Relation Age of Onset  . Breast cancer Mother   . Lung cancer Mother   . Colon cancer Paternal Uncle   . Kidney cancer Paternal Uncle   . Esophageal cancer Neg Hx     Social  History   Tobacco Use  . Smoking status: Former Smoker    Packs/day: 1.00    Years: 30.00    Pack years: 30.00    Types: Cigarettes    Last attempt to quit: 05/22/2011    Years since quitting: 7.1  . Smokeless tobacco: Never Used  Substance Use Topics  . Alcohol use: No  . Drug use: No    Current Outpatient Medications  Medication Sig Dispense Refill  . acetaminophen (TYLENOL) 500 MG tablet Take 1,000 mg by mouth every 6 (six) hours as needed for mild pain.    Marland Kitchen albuterol (PROVENTIL HFA;VENTOLIN HFA) 108 (90 BASE) MCG/ACT inhaler Inhale 2 puffs into the lungs every 6 (six) hours as needed for wheezing.    Marland Kitchen aspirin EC 81 MG tablet Take 81 mg by mouth daily.    . diphenhydrAMINE (BENADRYL) 25 mg capsule Take 25 mg by mouth at bedtime.    . docusate sodium (COLACE) 100 MG capsule Take 100 mg by mouth daily as needed for mild constipation.     . DULoxetine (CYMBALTA) 60 MG capsule Take 60 mg by mouth 2 (two) times daily.     . Insulin Degludec-Liraglutide (XULTOPHY) 100-3.6 UNIT-MG/ML SOPN Inject 25 Units into the skin daily.    Marland Kitchen morphine (MS CONTIN) 15 MG 12 hr tablet Take 15 mg by mouth 3 (three) times daily.     Marland Kitchen omeprazole (PRILOSEC) 40 MG capsule Take 40 mg by mouth daily.    Marland Kitchen oxyCODONE-acetaminophen (PERCOCET) 7.5-325 MG tablet Take 1 tablet by mouth every 4 (four) hours as needed for severe pain. 30 tablet 0  . rosuvastatin (CRESTOR) 10 MG tablet Take 10 mg by mouth every evening.      No current facility-administered medications for this visit.     Allergies  Allergen Reactions  . Codeine Rash and Swelling  . Penicillins Rash    PATIENT HAS HAD A PCN REACTION WITH IMMEDIATE RASH, FACIAL/TONGUE/THROAT SWELLING, SOB, OR LIGHTHEADEDNESS WITH HYPOTENSION:  #  #  #  YES  #  #  #   Has patient had a PCN reaction causing severe rash involving mucus membranes or skin necrosis: No Has patient had a PCN reaction that required hospitalization: No Has patient had a PCN reaction  occurring within the last 10 years: No   . Pregabalin Other (See Comments)    Suicidal thoughts  . Erythromycin Base Nausea And Vomiting    STOMACH PAIN  . Morphine And Related Nausea And Vomiting    Review of Systems:  Has anxiety and depression.     Physical Exam:    BP (!) 142/80   Pulse (!) 119   Ht 5\' 4"  (1.626 m)   Wt 158 lb 2 oz (71.7 kg)   BMI 27.14 kg/m  Filed Weights   07/11/18 1335  Weight: 158 lb 2 oz (71.7 kg)   Constitutional:  Well-developed, in no  acute distress. Psychiatric: Normal mood and affect. Behavior is normal. HEENT: Pupils normal.  Conjunctivae are normal. No scleral icterus. Neck supple.  Cardiovascular: Normal rate, regular rhythm. No edema Pulmonary/chest: Effort normal and breath sounds normal. No wheezing, rales or rhonchi. Abdominal: Soft, nondistended. Nontender. Bowel sounds active throughout. There are no masses palpable. No hepatomegaly. Rectal:  defered Neurological: Alert and oriented to person place and time. Skin: Skin is warm and dry. No rashes noted.  Data Reviewed: I have personally reviewed following labs and imaging studies  CBC: CBC Latest Ref Rng & Units 04/11/2017 06/14/2014 05/21/2014  WBC 4.0 - 10.5 K/uL 11.8(H) 13.4(H) 11.2(H)  Hemoglobin 12.0 - 15.0 g/dL 16.114.8 09.614.7 04.513.3  Hematocrit 36.0 - 46.0 % 44.3 42.9 39.2  Platelets 150 - 400 K/uL 293 286 372    CMP: CMP Latest Ref Rng & Units 04/11/2017 06/14/2014 05/21/2014  Glucose 65 - 99 mg/dL 409(W205(H) 119(J142(H) 478(G367(H)  BUN 6 - 20 mg/dL 19 16 15   Creatinine 0.44 - 1.00 mg/dL 9.560.84 2.130.68 0.860.74  Sodium 135 - 145 mmol/L 140 140 136(L)  Potassium 3.5 - 5.1 mmol/L 4.4 4.6 4.0  Chloride 101 - 111 mmol/L 104 98 99  CO2 22 - 32 mmol/L 29 30 26   Calcium 8.9 - 10.3 mg/dL 8.9 9.6 9.1  25 minutes spent with the patient today. Greater than 50% was spent in counseling and coordination of care with the patient    Edman Circleaj Naomee Nowland, MD 07/11/2018, 1:48 PM  Cc: Crist FatVan Eyk, Jason, MD

## 2018-07-12 LAB — CBC WITH DIFFERENTIAL/PLATELET
BASOS ABS: 0.2 10*3/uL — AB (ref 0.0–0.1)
Basophils Relative: 1.2 % (ref 0.0–3.0)
EOS ABS: 0.5 10*3/uL (ref 0.0–0.7)
Eosinophils Relative: 3.2 % (ref 0.0–5.0)
HCT: 42.3 % (ref 36.0–46.0)
HEMOGLOBIN: 14.1 g/dL (ref 12.0–15.0)
LYMPHS PCT: 26.1 % (ref 12.0–46.0)
Lymphs Abs: 3.8 10*3/uL (ref 0.7–4.0)
MCHC: 33.3 g/dL (ref 30.0–36.0)
MCV: 95.6 fl (ref 78.0–100.0)
Monocytes Absolute: 0.8 10*3/uL (ref 0.1–1.0)
Monocytes Relative: 5.7 % (ref 3.0–12.0)
Neutro Abs: 9.2 10*3/uL — ABNORMAL HIGH (ref 1.4–7.7)
Neutrophils Relative %: 63.8 % (ref 43.0–77.0)
Platelets: 460 10*3/uL — ABNORMAL HIGH (ref 150.0–400.0)
RBC: 4.43 Mil/uL (ref 3.87–5.11)
RDW: 13.8 % (ref 11.5–15.5)
WBC: 14.4 10*3/uL — AB (ref 4.0–10.5)

## 2018-07-12 LAB — COMPREHENSIVE METABOLIC PANEL
ALBUMIN: 3.8 g/dL (ref 3.5–5.2)
ALK PHOS: 117 U/L (ref 39–117)
ALT: 11 U/L (ref 0–35)
AST: 10 U/L (ref 0–37)
BILIRUBIN TOTAL: 0.3 mg/dL (ref 0.2–1.2)
BUN: 25 mg/dL — ABNORMAL HIGH (ref 6–23)
CALCIUM: 9.7 mg/dL (ref 8.4–10.5)
CO2: 23 mEq/L (ref 19–32)
Chloride: 107 mEq/L (ref 96–112)
Creatinine, Ser: 1.33 mg/dL — ABNORMAL HIGH (ref 0.40–1.20)
GFR: 42.46 mL/min — AB (ref >60.00)
Glucose, Bld: 214 mg/dL — ABNORMAL HIGH (ref 70–99)
Potassium: 5.8 mEq/L — ABNORMAL HIGH (ref 3.5–5.1)
Sodium: 139 mEq/L (ref 135–145)
Total Protein: 6.3 g/dL (ref 6.0–8.3)

## 2018-07-12 LAB — HEMOGLOBIN A1C: Hgb A1c MFr Bld: 9.7 % — ABNORMAL HIGH (ref 4.6–6.5)

## 2018-07-12 LAB — C-REACTIVE PROTEIN: CRP: 1 mg/dL (ref 0.5–20.0)

## 2018-07-12 LAB — TSH: TSH: 1.5 u[IU]/mL (ref 0.35–4.50)

## 2018-07-17 ENCOUNTER — Ambulatory Visit (AMBULATORY_SURGERY_CENTER): Payer: PPO | Admitting: Gastroenterology

## 2018-07-17 ENCOUNTER — Encounter: Payer: Self-pay | Admitting: Gastroenterology

## 2018-07-17 VITALS — BP 156/71 | HR 83 | Temp 95.9°F | Resp 14 | Ht 64.0 in | Wt 158.0 lb

## 2018-07-17 DIAGNOSIS — R197 Diarrhea, unspecified: Secondary | ICD-10-CM | POA: Diagnosis not present

## 2018-07-17 DIAGNOSIS — K5730 Diverticulosis of large intestine without perforation or abscess without bleeding: Secondary | ICD-10-CM

## 2018-07-17 DIAGNOSIS — K599 Functional intestinal disorder, unspecified: Secondary | ICD-10-CM | POA: Diagnosis not present

## 2018-07-17 DIAGNOSIS — Z1211 Encounter for screening for malignant neoplasm of colon: Secondary | ICD-10-CM | POA: Diagnosis not present

## 2018-07-17 DIAGNOSIS — K649 Unspecified hemorrhoids: Secondary | ICD-10-CM

## 2018-07-17 DIAGNOSIS — K319 Disease of stomach and duodenum, unspecified: Secondary | ICD-10-CM | POA: Diagnosis not present

## 2018-07-17 MED ORDER — SODIUM CHLORIDE 0.9 % IV SOLN: 500.0000 mL | Freq: Once | INTRAVENOUS | Status: DC

## 2018-07-17 NOTE — Patient Instructions (Signed)
YOU HAD AN ENDOSCOPIC PROCEDURE TODAY AT THE [facility]:   Refer to the procedure report that was given to you for any specific questions about what was found during the examination.  If the procedure report does not answer your questions, please call your gastroenterologist to clarify.  If you requested that your care partner not be given the details of your procedure findings, then the procedure report has been included in a sealed envelope for you to review at your convenience later.  YOU SHOULD EXPECT: Some feelings of bloating in the abdomen. Passage of more gas than usual.  Walking can help get rid of the air that was put into your GI tract during the procedure and reduce the bloating. If you had a lower endoscopy (such as a colonoscopy or flexible sigmoidoscopy) you may notice spotting of blood in your stool or on the toilet paper. If you underwent a bowel prep for your procedure, you may not have a normal bowel movement for a few days.  Please Note:  You might notice some irritation and congestion in your nose or some drainage.  This is from the oxygen used during your procedure.  There is no need for concern and it should clear up in a day or so.  SYMPTOMS TO REPORT IMMEDIATELY:   Following lower endoscopy (colonoscopy or flexible sigmoidoscopy):  Excessive amounts of blood in the stool  Significant tenderness or worsening of abdominal pains  Swelling of the abdomen that is new, acute  Fever of 100F or higher  For urgent or emergent issues, a gastroenterologist can be reached at any hour by calling [phone].   DIET:  We do recommend a small meal at first, but then you may proceed to your regular diet.  Drink plenty of fluids but you should avoid alcoholic beverages for 24 hours.  ACTIVITY:  You should plan to take it easy for the rest of today and you should NOT DRIVE or use heavy machinery until tomorrow (because of the sedation medicines used during the test).     FOLLOW UP: Our staff will call the number listed on your records the next business day following your procedure to check on you and address any questions or concerns that you may have regarding the information given to you following your procedure. If we do not reach you, we will leave a message.  However, if you are feeling well and you are not experiencing any problems, there is no need to return our call.  We will assume that you have returned to your regular daily activities without incident.  If any biopsies were taken you will be contacted by phone or by letter within the next 1-3 weeks.  Please call us at [phone] if you have not heard about the biopsies in 3 weeks.    SIGNATURES/CONFIDENTIALITY: You and/or your care partner have signed paperwork which will be entered into your electronic medical record.  These signatures attest to the fact that that the information above on your After Visit Summary has been reviewed and is understood.  Full responsibility of the confidentiality of this discharge information lies with you and/or your care-partner. 

## 2018-07-17 NOTE — Progress Notes (Signed)
To PACU, VSS. Report to [email or web address] 

## 2018-07-17 NOTE — Op Note (Signed)
[facility] Patient Name: Paula AntuCarolyn Hamilton Procedure Date: 07/17/2018 9:53 AM MRN: [id number] Endoscopist: Lynann Bolognaajesh Tayanna Talford , MD Age: [id number] Referring MD:  Date of Birth: June 15, 1951 Gender: Female Account #: [id number] Procedure:                Colonoscopy Indications:              Chronic diarrhea Medicines:                Monitored Anesthesia Care Procedure:                Pre-Anesthesia Assessment:                           - Prior to the procedure, a History and Physical                            was performed, and patient medications and                            allergies were reviewed. The patient's tolerance of                            previous anesthesia was also reviewed. The risks                            and benefits of the procedure and the sedation                            options and risks were discussed with the patient.                            All questions were answered, and informed consent                            was obtained. Prior Anticoagulants: The patient has                            taken no previous anticoagulant or antiplatelet                            agents. ASA Grade Assessment: II - A patient with                            mild systemic disease. After reviewing the risks                            and benefits, the patient was deemed in                            satisfactory condition to undergo the procedure.                           After obtaining informed consent, the colonoscope  was passed under direct vision. Throughout the                            procedure, the patient's blood pressure, pulse, and                            oxygen saturations were monitored continuously. The                            Colonoscope was introduced through the anus and                            advanced to the 2 cm into the ileum. The                            colonoscopy was performed without difficulty. The                           patient tolerated the procedure well. The quality                            of the bowel preparation was good. Scope In: 9:57:52 AM Scope Out: 10:17:11 AM Scope Withdrawal Time: 0 hours 13 minutes 31 seconds  Total Procedure Duration: 0 hours 19 minutes 19 seconds  Findings:                 A few rare small-mouthed diverticula were found in                            the sigmoid colon.                           The colon (entire examined portion) appeared normal                            except for minimal patchy erythema especially in                            the right colon suggestive of resolving colitis.                            Biopsies for histology were taken with a cold                            forceps for evaluation of microscopic colitis.                            Small internal hemorrhoids.                           The terminal ileum appeared normal. Biopsies were                            taken with a cold forceps for histology. Complications:  No immediate complications. Estimated Blood Loss:     Estimated blood loss: none. Impression:               -Mild sigmoid diverticulosis.                           -Patchy erythema suggestive of resolving acute                            colitis (biopsed).                           -Otherwise normal colonoscopy to TI. Recommendation:           - Patient has a contact number available for                            emergencies. The signs and symptoms of potential                            delayed complications were discussed with the                            patient. Return to normal activities tomorrow.                            Written discharge instructions were provided to the                            patient.                           - Resume previous diet.                           - Continue present medications.                           - Await pathology results.                            - Imodium 1 tablet PO BID.                           - Follow-up in the GI clinic in 4 to 6 weeks if                            still with problems. Lynann Bolognaajesh Andreah Goheen, MD 07/17/2018 10:24:00 AM This report has been signed electronically.

## 2018-07-18 ENCOUNTER — Telehealth: Payer: Self-pay | Admitting: *Deleted

## 2018-07-18 NOTE — Telephone Encounter (Signed)
  Follow up Call-  Call back number 07/17/2018  Post procedure Call Back phone  # [phone]  Permission to leave phone message Yes  Some recent data might be hidden     Patient questions:  Do you have a fever, pain , or abdominal swelling? No. Pain Score  0 *  Have you tolerated food without any problems? Yes.    Have you been able to return to your normal activities? Yes.    Do you have any questions about your discharge instructions: Diet   No. Medications  No. Follow up visit  No.  Do you have questions or concerns about your Care? No.  Actions: * If pain score is 4 or above: No action needed, pain <4.

## 2018-07-25 ENCOUNTER — Encounter: Payer: Self-pay | Admitting: Gastroenterology

## 2018-09-03 DIAGNOSIS — R569 Unspecified convulsions: Secondary | ICD-10-CM

## 2018-09-03 HISTORY — DX: Unspecified convulsions: R56.9

## 2018-09-15 DIAGNOSIS — I63511 Cerebral infarction due to unspecified occlusion or stenosis of right middle cerebral artery: Secondary | ICD-10-CM | POA: Diagnosis not present

## 2018-09-15 DIAGNOSIS — I639 Cerebral infarction, unspecified: Secondary | ICD-10-CM

## 2018-09-15 DIAGNOSIS — I6789 Other cerebrovascular disease: Secondary | ICD-10-CM | POA: Diagnosis not present

## 2018-09-15 DIAGNOSIS — R569 Unspecified convulsions: Secondary | ICD-10-CM | POA: Diagnosis not present

## 2018-09-15 DIAGNOSIS — E875 Hyperkalemia: Secondary | ICD-10-CM | POA: Diagnosis not present

## 2018-09-15 DIAGNOSIS — E119 Type 2 diabetes mellitus without complications: Secondary | ICD-10-CM | POA: Diagnosis not present

## 2018-09-15 DIAGNOSIS — R29702 NIHSS score 2: Secondary | ICD-10-CM | POA: Diagnosis not present

## 2018-09-15 DIAGNOSIS — R29810 Facial weakness: Secondary | ICD-10-CM | POA: Diagnosis not present

## 2018-09-15 DIAGNOSIS — R410 Disorientation, unspecified: Secondary | ICD-10-CM | POA: Diagnosis not present

## 2018-09-15 DIAGNOSIS — R4781 Slurred speech: Secondary | ICD-10-CM | POA: Diagnosis not present

## 2018-09-15 DIAGNOSIS — R4182 Altered mental status, unspecified: Secondary | ICD-10-CM | POA: Diagnosis not present

## 2018-09-15 DIAGNOSIS — D72829 Elevated white blood cell count, unspecified: Secondary | ICD-10-CM | POA: Diagnosis not present

## 2018-09-15 DIAGNOSIS — I10 Essential (primary) hypertension: Secondary | ICD-10-CM | POA: Diagnosis not present

## 2018-09-15 DIAGNOSIS — R4789 Other speech disturbances: Secondary | ICD-10-CM | POA: Diagnosis not present

## 2018-09-15 DIAGNOSIS — J449 Chronic obstructive pulmonary disease, unspecified: Secondary | ICD-10-CM | POA: Diagnosis not present

## 2018-09-15 DIAGNOSIS — G8311 Monoplegia of lower limb affecting right dominant side: Secondary | ICD-10-CM | POA: Diagnosis not present

## 2018-09-15 DIAGNOSIS — F17210 Nicotine dependence, cigarettes, uncomplicated: Secondary | ICD-10-CM | POA: Diagnosis not present

## 2018-09-15 DIAGNOSIS — Z5309 Procedure and treatment not carried out because of other contraindication: Secondary | ICD-10-CM | POA: Diagnosis not present

## 2018-09-15 DIAGNOSIS — I6310 Cerebral infarction due to embolism of unspecified precerebral artery: Secondary | ICD-10-CM | POA: Diagnosis not present

## 2018-09-15 HISTORY — DX: Cerebral infarction, unspecified: I63.9

## 2018-09-26 DIAGNOSIS — G40909 Epilepsy, unspecified, not intractable, without status epilepticus: Secondary | ICD-10-CM | POA: Diagnosis not present

## 2018-09-26 DIAGNOSIS — E1165 Type 2 diabetes mellitus with hyperglycemia: Secondary | ICD-10-CM | POA: Diagnosis not present

## 2018-09-28 DIAGNOSIS — Z8673 Personal history of transient ischemic attack (TIA), and cerebral infarction without residual deficits: Secondary | ICD-10-CM | POA: Diagnosis not present

## 2018-09-28 DIAGNOSIS — E119 Type 2 diabetes mellitus without complications: Secondary | ICD-10-CM | POA: Diagnosis not present

## 2018-09-28 DIAGNOSIS — R51 Headache: Secondary | ICD-10-CM | POA: Diagnosis not present

## 2018-09-28 DIAGNOSIS — I639 Cerebral infarction, unspecified: Secondary | ICD-10-CM | POA: Diagnosis not present

## 2018-09-28 DIAGNOSIS — I160 Hypertensive urgency: Secondary | ICD-10-CM | POA: Diagnosis not present

## 2018-09-28 DIAGNOSIS — I10 Essential (primary) hypertension: Secondary | ICD-10-CM | POA: Diagnosis not present

## 2018-10-09 ENCOUNTER — Encounter: Payer: Self-pay | Admitting: *Deleted

## 2018-10-09 ENCOUNTER — Telehealth: Payer: Self-pay | Admitting: *Deleted

## 2018-10-09 ENCOUNTER — Other Ambulatory Visit: Payer: Self-pay | Admitting: *Deleted

## 2018-10-09 NOTE — Telephone Encounter (Signed)
LVM requesting call back to update EMR prior to video visit tomorrow.

## 2018-10-10 ENCOUNTER — Encounter: Payer: Self-pay | Admitting: *Deleted

## 2018-10-10 ENCOUNTER — Ambulatory Visit: Payer: PPO | Admitting: Diagnostic Neuroimaging

## 2018-10-10 ENCOUNTER — Other Ambulatory Visit: Payer: Self-pay

## 2018-10-10 NOTE — Telephone Encounter (Signed)
Received call from patient stating the Webex wasn't loading on her phone; she doesn't have computer.  Confirmed she was trying to download the correct app.  Advised she uninstall, reinstall app then use 'join meeting' box in e mail to join. Updated her EMR. She  verbalized understanding, appreciation.

## 2018-10-25 NOTE — Telephone Encounter (Signed)
LVM requesting patient call back. Her video visit tomorrow was cancelled. Advised I will try to help her with webex or reschedule her in mid June.

## 2018-10-26 ENCOUNTER — Ambulatory Visit: Payer: PPO | Admitting: Diagnostic Neuroimaging

## 2018-11-15 ENCOUNTER — Ambulatory Visit: Payer: PPO | Admitting: Diagnostic Neuroimaging

## 2018-11-15 DIAGNOSIS — E113593 Type 2 diabetes mellitus with proliferative diabetic retinopathy without macular edema, bilateral: Secondary | ICD-10-CM | POA: Diagnosis not present

## 2018-12-19 NOTE — Telephone Encounter (Signed)
Called patient and informed her that due to Covid 19, we are still limiting patients coming into office until July. She rescheduled,  verbalized understanding, appreciation.

## 2018-12-25 ENCOUNTER — Ambulatory Visit: Payer: PPO | Admitting: Diagnostic Neuroimaging

## 2018-12-29 DIAGNOSIS — R319 Hematuria, unspecified: Secondary | ICD-10-CM | POA: Diagnosis not present

## 2018-12-29 DIAGNOSIS — E1165 Type 2 diabetes mellitus with hyperglycemia: Secondary | ICD-10-CM | POA: Diagnosis not present

## 2018-12-29 DIAGNOSIS — R531 Weakness: Secondary | ICD-10-CM | POA: Diagnosis not present

## 2019-01-12 ENCOUNTER — Ambulatory Visit: Payer: Self-pay | Admitting: Diagnostic Neuroimaging

## 2019-01-29 ENCOUNTER — Encounter: Payer: Self-pay | Admitting: *Deleted

## 2019-01-30 ENCOUNTER — Encounter: Payer: Self-pay | Admitting: Diagnostic Neuroimaging

## 2019-01-30 ENCOUNTER — Ambulatory Visit (INDEPENDENT_AMBULATORY_CARE_PROVIDER_SITE_OTHER): Payer: PPO | Admitting: Diagnostic Neuroimaging

## 2019-01-30 ENCOUNTER — Other Ambulatory Visit: Payer: Self-pay

## 2019-01-30 VITALS — BP 138/90 | HR 76 | Temp 97.1°F | Ht 64.0 in | Wt 152.6 lb

## 2019-01-30 DIAGNOSIS — G459 Transient cerebral ischemic attack, unspecified: Secondary | ICD-10-CM | POA: Diagnosis not present

## 2019-01-30 DIAGNOSIS — G40209 Localization-related (focal) (partial) symptomatic epilepsy and epileptic syndromes with complex partial seizures, not intractable, without status epilepticus: Secondary | ICD-10-CM | POA: Diagnosis not present

## 2019-01-30 MED ORDER — LEVETIRACETAM 500 MG PO TABS: 500.0000 mg | ORAL_TABLET | Freq: Two times a day (BID) | ORAL | 4 refills | Status: DC

## 2019-01-30 NOTE — Progress Notes (Signed)
[facility]  PATIENT: Paula Hamilton DOB: 1951-04-14  REFERRING CLINICIAN: Leonia ReaderVan Eyk HISTORY FROM: patient  REASON FOR VISIT: new consult    HISTORICAL  CHIEF COMPLAINT:  Chief Complaint  Patient presents with  . Possible stroke    rm 6 New Pt    HISTORY OF PRESENT ILLNESS:   68 year old female with hyperlipidemia, diabetes, chronic pain syndrome, here for evaluation of stroke versus seizure.  [place] she had slurred speech and right-sided weakness.  She had confusion.  Blood pressure at home was 245/110 and blood glucose was 341.  Patient went to the hospital and was given IV TPA.  She was transferred to another hospital to neuro ICU.  Patient had 2 more attacks while in the hospital and therefore possibility of seizure was raised.  Patient was discharged on levetiracetam 500 mg twice a day.  Stroke work-up was completed.  Since that time no further events.  Patient is tolerating medication without difficulty.  She is not driving.  There is family history of seizure in her mother and nephew.  Other family members have history of stroke.   REVIEW OF SYSTEMS: Full 14 system review of systems performed and negative with exception of: As per HPI.  ALLERGIES: Allergies  Allergen Reactions  . Codeine Rash and Swelling  . Penicillins Rash    PATIENT HAS HAD A PCN REACTION WITH IMMEDIATE RASH, FACIAL/TONGUE/THROAT SWELLING, SOB, OR LIGHTHEADEDNESS WITH HYPOTENSION:  #  #  #  YES  #  #  #   Has patient had a PCN reaction causing severe rash involving mucus membranes or skin necrosis: No Has patient had a PCN reaction that required hospitalization: No Has patient had a PCN reaction occurring within the last 10 years: No   . Pregabalin Other (See Comments)    Suicidal thoughts, extreme depression  . Erythromycin Base Nausea And Vomiting    STOMACH PAIN  . Morphine And Related Nausea And Vomiting    Has  taken in pill form w/o side effects    HOME MEDICATIONS: Outpatient Medications Prior to Visit  Medication Sig Dispense Refill  . acetaminophen (TYLENOL) 500 MG tablet Take 1,000 mg by mouth every 6 (six) hours as needed for mild pain.    Marland Kitchen. albuterol (PROVENTIL HFA;VENTOLIN HFA) 108 (90 BASE) MCG/ACT inhaler Inhale 2 puffs into the lungs every 6 (six) hours as needed for wheezing.    . cloNIDine (CATAPRES) 0.1 MG tablet 0.1 mg. If BP > 160    . diphenhydrAMINE (BENADRYL) 25 mg capsule Take 25 mg by mouth at bedtime as needed.     . diphenoxylate-atropine (LOMOTIL) 2.5-0.025 MG tablet Take 1 tablet by mouth 3 (three) times daily as needed for diarrhea or loose stools. 90 tablet 1  . docusate sodium (COLACE) 100 MG capsule Take 100 mg by mouth daily as needed for mild constipation.     . DULoxetine (CYMBALTA) 60 MG capsule Take 60 mg by mouth 2 (two) times daily.     . Insulin Degludec-Liraglutide (XULTOPHY) 100-3.6 UNIT-MG/ML SOPN Inject 34 Units into the skin daily.     Marland Kitchen. levETIRAcetam (KEPPRA) 500 MG tablet 500 mg 2 (two) times daily.    Marland Kitchen. lisinopril (PRINIVIL,ZESTRIL) 40 MG tablet 40 mg daily.    . metFORMIN (GLUCOPHAGE-XR) 500 MG 24 hr tablet 1,000 mg 2 (two) times daily.    Marland Kitchen. NOVOLOG FLEXPEN 100 UNIT/ML FlexPen 6 Units. At meal time    .  omeprazole (PRILOSEC) 40 MG capsule Take 40 mg by mouth daily.    . traMADol (ULTRAM) 50 MG tablet Take by mouth every 8 (eight) hours as needed.    . traZODone (DESYREL) 50 MG tablet Take 50 mg by mouth at bedtime.    Marland Kitchen. atorvastatin (LIPITOR) 20 MG tablet Take 20 mg by mouth 2 (two) times daily.     No facility-administered medications prior to visit.     PAST MEDICAL HISTORY: Past Medical History:  Diagnosis Date  . 3rd cranial nerve palsy, right    just start 03/13/2017  . Anxiety   . Arthritis    listhesis, instability  . Asthma   . Bronchitis    just finished Z pack and is taking cough syrup with Codiene for cough  . CAD (coronary artery  disease)   . Chronic pain   . COPD (chronic obstructive pulmonary disease) (HCC)   . Depression   . Diabetes mellitus without complication (HCC)    fasting 270s  . Diabetic retinopathy (HCC)   . Dysphagia   . Fibromyalgia   . GERD (gastroesophageal reflux disease)   . Headache(784.0)    last migraine > 1 yr.   Marland Kitchen. Heart murmur    mentioned yrs. ago, never mentioned again.   Marland Kitchen. History of colon polyps   . Hx of migraines   . Hypercholesterolemia   . Hypertension   . Mental disorder    bipolar disorder  . Neuropathy   . Pneumonia    6 years ago  . PONV (postoperative nausea and vomiting)    has had other surgeries with no problems  . Seizures (HCC) 09/2018  . Shortness of breath   . Stroke [facility](HCC) 09/15/2018    PAST SURGICAL HISTORY: Past Surgical History:  Procedure Laterality Date  . ABDOMINAL HYSTERECTOMY    . APPENDECTOMY    . BACK SURGERY     lower back 4 time, tens surgery too  . CERVICAL FUSION    . COLONOSCOPY W/ BIOPSIES AND POLYPECTOMY  11/21/2014   Colonic polyp status post polypectomy. benign. Mild sigmoid diverticulosis.  Marland Kitchen. ESOPHAGOGASTRODUODENOSCOPY  02/26/2016   Moderate gastritis. Otherwise normal EGD   . ESOPHAGOGASTRODUODENOSCOPY  08/24/20117   Mioderate gastritis. OTherwise normal EGD.   Marland Kitchen. EYE SURGERY     laser surgery on left eye   . MAXIMUM ACCESS (MAS)POSTERIOR LUMBAR INTERBODY FUSION (PLIF) 1 LEVEL N/A 04/12/2013   Procedure: Lumba four-five maximum access surgery Posterior Lumbar Interbody Fusion;  Surgeon: Tia Alertavid S Jones, MD;  Location: [facility];  Service: Neurosurgery;  Laterality: N/A;  Lumba four-five maximum access surgery Posterior Lumbar Interbody Fusion  . SPINAL CORD STIMULATOR INSERTION N/A 06/14/2014   Procedure: LUMBAR SPINAL CORD STIMULATOR INSERTION;  Surgeon: Gwynne EdingerPaul C Harkins, MD;  Location: [facility];  Service: Neurosurgery;  Laterality: N/A;  . SPINAL CORD STIMULATOR REMOVAL N/A 04/12/2017   Procedure: LUMBAR SPINAL CORD  STIMULATOR REMOVAL;  Surgeon: Odette FractionHarkins, Paul, MD;  [facility];  Service: Neurosurgery;  Laterality: N/A;  . TUBAL LIGATION      FAMILY HISTORY: Family History  Problem Relation Age of Onset  . Breast cancer Mother   . Lung cancer Mother   . Colon cancer Paternal Uncle   . Kidney cancer Paternal Uncle   . Stomach cancer Paternal Aunt   . Stroke Sister        in 2  . Diabetes Sister   . Stroke Brother        in 2  .  Diabetes Brother   . Diabetes Brother   . Esophageal cancer Neg Hx     SOCIAL HISTORY: Social History   Socioeconomic History  . Marital status: Widowed    Spouse name: Not on file  . Number of children: 2  . Years of education: Not on file  . Highest education level: Some college, no degree  Occupational History    Comment: retired  Scientific laboratory technician  . Financial resource strain: Not on file  . Food insecurity    Worry: Not on file    Inability: Not on file  . Transportation needs    Medical: Not on file    Non-medical: Not on file  Tobacco Use  . Smoking status: Former Smoker    Packs/day: 1.00    Years: 30.00    Pack years: 30.00    Types: Cigarettes    Quit date: 05/22/2011    Years since quitting: 7.6  . Smokeless tobacco: Never Used  Substance and Sexual Activity  . Alcohol use: No  . Drug use: No  . Sexual activity: Not on file  Lifestyle  . Physical activity    Days per week: Not on file    Minutes per session: Not on file  . Stress: Not on file  Relationships  . Social Herbalist on phone: Not on file    Gets together: Not on file    Attends religious service: Not on file    Active member of club or organization: Not on file    Attends meetings of clubs or organizations: Not on file    Relationship status: Not on file  . Intimate partner violence    Fear of current or ex partner: Not on file    Emotionally abused: Not on file    Physically abused: Not on file    Forced sexual activity: Not on file  Other Topics  Concern  . Not on file  Social History Narrative   Lives with sister, bro-in-law   Caffeine- not much     PHYSICAL EXAM  GENERAL EXAM/CONSTITUTIONAL: Vitals:  Vitals:   01/30/19 1337  BP: 138/90  Pulse: 76  Temp: (!) 97.1 F (36.2 C)  Weight: 152 lb 9.6 oz (69.2 kg)  Height: 5\' 4"  (1.626 m)     Body mass index is 26.19 kg/m. Wt Readings from Last 3 Encounters:  01/30/19 152 lb 9.6 oz (69.2 kg)  07/17/18 158 lb (71.7 kg)  07/11/18 158 lb 2 oz (71.7 kg)     Patient is in no distress; well developed, nourished and groomed; neck is supple  SMELLS OF CIGARETTE SMOKE  CARDIOVASCULAR:  Examination of carotid arteries is normal; no carotid bruits  Regular rate and rhythm, no murmurs  Examination of peripheral vascular system by observation and palpation is normal  EYES:  Ophthalmoscopic exam of optic discs and posterior segments is normal; no papilledema or hemorrhages  No exam data present  MUSCULOSKELETAL:  Gait, strength, tone, movements noted in Neurologic exam below  NEUROLOGIC: MENTAL STATUS:  No flowsheet data found.  awake, alert, oriented to person, place and time  recent and remote memory intact  normal attention and concentration  language fluent, comprehension intact, naming intact  fund of knowledge appropriate  CRANIAL NERVE:   2nd - no papilledema on fundoscopic exam  2nd, 3rd, 4th, 6th - pupils equal and reactive to light, visual fields full to confrontation, extraocular muscles intact, no nystagmus  5th - facial sensation symmetric  7th -  facial strength symmetric  8th - hearing intact  9th - palate elevates symmetrically, uvula midline  11th - shoulder shrug symmetric  12th - tongue protrusion midline  HOARSE VOICE  MOTOR:   normal bulk and tone, full strength in the BUE, BLE  SENSORY:   normal and symmetric to light touch, temperature, vibration; EXCEPT DECR IN FEET  COORDINATION:   finger-nose-finger, fine  finger movements normal  REFLEXES:   deep tendon reflexes TRACE and symmetric  GAIT/STATION:   ANTALGIC GAIT     DIAGNOSTIC DATA (LABS, IMAGING, TESTING) - I reviewed patient records, labs, notes, testing and imaging myself where available.  Lab Results  Component Value Date   WBC 14.4 (H) 07/11/2018   HGB 14.1 07/11/2018   HCT 42.3 07/11/2018   MCV 95.6 07/11/2018   PLT 460.0 (H) 07/11/2018      Component Value Date/Time   NA 139 07/11/2018 1450   K 5.8 (H) 07/11/2018 1450   CL 107 07/11/2018 1450   CO2 23 07/11/2018 1450   GLUCOSE 214 (H) 07/11/2018 1450   BUN 25 (H) 07/11/2018 1450   CREATININE 1.33 (H) 07/11/2018 1450   CALCIUM 9.7 07/11/2018 1450   PROT 6.3 07/11/2018 1450   ALBUMIN 3.8 07/11/2018 1450   AST 10 07/11/2018 1450   ALT 11 07/11/2018 1450   ALKPHOS 117 07/11/2018 1450   BILITOT 0.3 07/11/2018 1450   GFRNONAA >60 04/11/2017 0853   GFRAA >60 04/11/2017 0853   No results found for: CHOL, HDL, LDLCALC, LDLDIRECT, TRIG, CHOLHDL Lab Results  Component Value Date   HGBA1C 9.7 (H) 07/11/2018   No results found for: VITAMINB12 Lab Results  Component Value Date   TSH 1.50 07/11/2018    09/17/18 CT head - no acute finding  09/15/18 CTA head / neck -No large vessel occlusion; no aneurysmal dilation -Right ICA 55% origin stenosis -Left ICA no stenosis  09/16/18 MRI brain -No acute infarct or other intracranial finding   ASSESSMENT AND PLAN  68 y.o. year old female here with 3 episodes of slurred speech and right-sided facial weakness, may have represented TIA versus partial seizure.  Stroke work-up was completed.  Patient is tolerating levetiracetam 5 mg twice a day.   Ddx: TIA vs seizure  1. Partial symptomatic epilepsy with complex partial seizures, not intractable, without status epilepticus (HCC)   2. TIA (transient ischemic attack)      PLAN:  SEIZURE PREVENTION - continue levetiracetam 500mg  twice a day  - According to [place] law,  you can not drive unless you are seizure / syncope free for at least 6 months and under physician's care.  - Please maintain precautions. Do not participate in activities where a loss of awareness could harm you or someone else. No swimming alone, no tub bathing, no hot tubs, no driving, no operating motorized vehicles (cars, ATVs, motocycles, etc), lawnmowers, power tools or firearms. No standing at heights, such as rooftops, ladders or stairs. Avoid hot objects such as stoves, heaters, open fires. Wear a helmet when riding a bicycle, scooter, skateboard, etc. and avoid areas of traffic. Set your water heater to 120 degrees or less.  STROKE PREVENTION - continue aspirin, DM control, statin, BP control  Meds ordered this encounter  Medications  . levETIRAcetam (KEPPRA) 500 MG tablet    Sig: Take 1 tablet (500 mg total) by mouth 2 (two) times daily.    Dispense:  180 tablet    Refill:  4   Return in about 1 year (  around 01/30/2020).  I reviewed hospital records, labs, notes, myself. I summarized findings and reviewed with patient, for this high risk condition (stroke; seizure) requiring high complexity decision making.     Suanne MarkerVIKRAM R. Shalon Salado, MD 01/30/2019, 2:42 PM Certified in Neurology, Neurophysiology and Neuroimaging  [facility] [address], [address] [place], [place] [address] [phone]

## 2019-03-30 DIAGNOSIS — Z1389 Encounter for screening for other disorder: Secondary | ICD-10-CM | POA: Diagnosis not present

## 2019-03-30 DIAGNOSIS — Z87891 Personal history of nicotine dependence: Secondary | ICD-10-CM | POA: Diagnosis not present

## 2019-03-30 DIAGNOSIS — E7800 Pure hypercholesterolemia, unspecified: Secondary | ICD-10-CM | POA: Diagnosis not present

## 2019-03-30 DIAGNOSIS — E11319 Type 2 diabetes mellitus with unspecified diabetic retinopathy without macular edema: Secondary | ICD-10-CM | POA: Diagnosis not present

## 2019-03-30 DIAGNOSIS — M797 Fibromyalgia: Secondary | ICD-10-CM | POA: Diagnosis not present

## 2019-03-30 DIAGNOSIS — E1165 Type 2 diabetes mellitus with hyperglycemia: Secondary | ICD-10-CM | POA: Diagnosis not present

## 2019-03-30 DIAGNOSIS — Z0000 Encounter for general adult medical examination without abnormal findings: Secondary | ICD-10-CM | POA: Diagnosis not present

## 2019-03-30 DIAGNOSIS — I679 Cerebrovascular disease, unspecified: Secondary | ICD-10-CM | POA: Diagnosis not present

## 2019-03-30 DIAGNOSIS — Z23 Encounter for immunization: Secondary | ICD-10-CM | POA: Diagnosis not present

## 2019-03-30 DIAGNOSIS — I10 Essential (primary) hypertension: Secondary | ICD-10-CM | POA: Diagnosis not present

## 2019-05-08 DIAGNOSIS — I6789 Other cerebrovascular disease: Secondary | ICD-10-CM | POA: Diagnosis not present

## 2019-05-08 DIAGNOSIS — F17200 Nicotine dependence, unspecified, uncomplicated: Secondary | ICD-10-CM | POA: Diagnosis not present

## 2019-05-08 DIAGNOSIS — I10 Essential (primary) hypertension: Secondary | ICD-10-CM | POA: Diagnosis not present

## 2019-05-08 DIAGNOSIS — F329 Major depressive disorder, single episode, unspecified: Secondary | ICD-10-CM | POA: Diagnosis not present

## 2019-05-08 DIAGNOSIS — E119 Type 2 diabetes mellitus without complications: Secondary | ICD-10-CM | POA: Diagnosis not present

## 2019-05-08 DIAGNOSIS — R29810 Facial weakness: Secondary | ICD-10-CM | POA: Diagnosis not present

## 2019-05-08 DIAGNOSIS — I69334 Monoplegia of upper limb following cerebral infarction affecting left non-dominant side: Secondary | ICD-10-CM | POA: Diagnosis not present

## 2019-05-08 DIAGNOSIS — Z7982 Long term (current) use of aspirin: Secondary | ICD-10-CM | POA: Diagnosis not present

## 2019-05-08 DIAGNOSIS — R569 Unspecified convulsions: Secondary | ICD-10-CM | POA: Diagnosis not present

## 2019-05-08 DIAGNOSIS — Z7902 Long term (current) use of antithrombotics/antiplatelets: Secondary | ICD-10-CM | POA: Diagnosis not present

## 2019-05-08 DIAGNOSIS — I252 Old myocardial infarction: Secondary | ICD-10-CM | POA: Diagnosis not present

## 2019-05-08 DIAGNOSIS — R0602 Shortness of breath: Secondary | ICD-10-CM | POA: Diagnosis not present

## 2019-05-08 DIAGNOSIS — Z794 Long term (current) use of insulin: Secondary | ICD-10-CM | POA: Diagnosis not present

## 2019-05-08 DIAGNOSIS — F418 Other specified anxiety disorders: Secondary | ICD-10-CM | POA: Diagnosis not present

## 2019-05-08 DIAGNOSIS — Z885 Allergy status to narcotic agent status: Secondary | ICD-10-CM | POA: Diagnosis not present

## 2019-05-08 DIAGNOSIS — D72829 Elevated white blood cell count, unspecified: Secondary | ICD-10-CM | POA: Diagnosis not present

## 2019-05-08 DIAGNOSIS — R4781 Slurred speech: Secondary | ICD-10-CM | POA: Diagnosis not present

## 2019-05-08 DIAGNOSIS — H4900 Third [oculomotor] nerve palsy, unspecified eye: Secondary | ICD-10-CM | POA: Diagnosis not present

## 2019-05-08 DIAGNOSIS — R4182 Altered mental status, unspecified: Secondary | ICD-10-CM | POA: Diagnosis not present

## 2019-05-08 DIAGNOSIS — I639 Cerebral infarction, unspecified: Secondary | ICD-10-CM | POA: Diagnosis not present

## 2019-05-08 DIAGNOSIS — Z5309 Procedure and treatment not carried out because of other contraindication: Secondary | ICD-10-CM | POA: Diagnosis not present

## 2019-05-08 DIAGNOSIS — R4789 Other speech disturbances: Secondary | ICD-10-CM | POA: Diagnosis not present

## 2019-05-08 DIAGNOSIS — I129 Hypertensive chronic kidney disease with stage 1 through stage 4 chronic kidney disease, or unspecified chronic kidney disease: Secondary | ICD-10-CM | POA: Diagnosis not present

## 2019-05-08 DIAGNOSIS — E785 Hyperlipidemia, unspecified: Secondary | ICD-10-CM | POA: Diagnosis not present

## 2019-05-08 DIAGNOSIS — N183 Chronic kidney disease, stage 3 unspecified: Secondary | ICD-10-CM | POA: Diagnosis not present

## 2019-05-08 DIAGNOSIS — G459 Transient cerebral ischemic attack, unspecified: Secondary | ICD-10-CM | POA: Diagnosis not present

## 2019-05-08 DIAGNOSIS — E1122 Type 2 diabetes mellitus with diabetic chronic kidney disease: Secondary | ICD-10-CM | POA: Diagnosis not present

## 2019-05-08 DIAGNOSIS — G8194 Hemiplegia, unspecified affecting left nondominant side: Secondary | ICD-10-CM | POA: Diagnosis not present

## 2019-05-08 DIAGNOSIS — Z880 Allergy status to penicillin: Secondary | ICD-10-CM | POA: Diagnosis not present

## 2019-05-21 DIAGNOSIS — E7800 Pure hypercholesterolemia, unspecified: Secondary | ICD-10-CM | POA: Diagnosis not present

## 2019-05-21 DIAGNOSIS — I679 Cerebrovascular disease, unspecified: Secondary | ICD-10-CM | POA: Diagnosis not present

## 2019-05-31 DIAGNOSIS — R569 Unspecified convulsions: Secondary | ICD-10-CM | POA: Diagnosis not present

## 2019-05-31 DIAGNOSIS — Z7982 Long term (current) use of aspirin: Secondary | ICD-10-CM | POA: Diagnosis not present

## 2019-05-31 DIAGNOSIS — R197 Diarrhea, unspecified: Secondary | ICD-10-CM | POA: Diagnosis not present

## 2019-05-31 DIAGNOSIS — Z7902 Long term (current) use of antithrombotics/antiplatelets: Secondary | ICD-10-CM | POA: Diagnosis not present

## 2019-05-31 DIAGNOSIS — F329 Major depressive disorder, single episode, unspecified: Secondary | ICD-10-CM | POA: Diagnosis not present

## 2019-05-31 DIAGNOSIS — G459 Transient cerebral ischemic attack, unspecified: Secondary | ICD-10-CM | POA: Diagnosis not present

## 2019-05-31 DIAGNOSIS — R4182 Altered mental status, unspecified: Secondary | ICD-10-CM | POA: Diagnosis not present

## 2019-05-31 DIAGNOSIS — E785 Hyperlipidemia, unspecified: Secondary | ICD-10-CM | POA: Diagnosis not present

## 2019-05-31 DIAGNOSIS — Z880 Allergy status to penicillin: Secondary | ICD-10-CM | POA: Diagnosis not present

## 2019-05-31 DIAGNOSIS — Z885 Allergy status to narcotic agent status: Secondary | ICD-10-CM | POA: Diagnosis not present

## 2019-05-31 DIAGNOSIS — Z5309 Procedure and treatment not carried out because of other contraindication: Secondary | ICD-10-CM | POA: Diagnosis not present

## 2019-05-31 DIAGNOSIS — Z743 Need for continuous supervision: Secondary | ICD-10-CM | POA: Diagnosis not present

## 2019-05-31 DIAGNOSIS — R29898 Other symptoms and signs involving the musculoskeletal system: Secondary | ICD-10-CM | POA: Diagnosis not present

## 2019-05-31 DIAGNOSIS — E1122 Type 2 diabetes mellitus with diabetic chronic kidney disease: Secondary | ICD-10-CM | POA: Diagnosis not present

## 2019-05-31 DIAGNOSIS — R531 Weakness: Secondary | ICD-10-CM | POA: Diagnosis not present

## 2019-05-31 DIAGNOSIS — I129 Hypertensive chronic kidney disease with stage 1 through stage 4 chronic kidney disease, or unspecified chronic kidney disease: Secondary | ICD-10-CM | POA: Diagnosis not present

## 2019-05-31 DIAGNOSIS — E11649 Type 2 diabetes mellitus with hypoglycemia without coma: Secondary | ICD-10-CM | POA: Diagnosis not present

## 2019-05-31 DIAGNOSIS — I10 Essential (primary) hypertension: Secondary | ICD-10-CM | POA: Diagnosis not present

## 2019-05-31 DIAGNOSIS — F17200 Nicotine dependence, unspecified, uncomplicated: Secondary | ICD-10-CM | POA: Diagnosis not present

## 2019-05-31 DIAGNOSIS — D72829 Elevated white blood cell count, unspecified: Secondary | ICD-10-CM | POA: Diagnosis not present

## 2019-05-31 DIAGNOSIS — R4789 Other speech disturbances: Secondary | ICD-10-CM | POA: Diagnosis not present

## 2019-05-31 DIAGNOSIS — Z794 Long term (current) use of insulin: Secondary | ICD-10-CM | POA: Diagnosis not present

## 2019-05-31 DIAGNOSIS — N183 Chronic kidney disease, stage 3 unspecified: Secondary | ICD-10-CM | POA: Diagnosis not present

## 2019-06-08 DIAGNOSIS — I679 Cerebrovascular disease, unspecified: Secondary | ICD-10-CM | POA: Diagnosis not present

## 2019-06-11 ENCOUNTER — Encounter: Payer: Self-pay | Admitting: Neurology

## 2019-07-11 ENCOUNTER — Other Ambulatory Visit: Payer: Self-pay | Admitting: Gastroenterology

## 2019-07-11 DIAGNOSIS — R197 Diarrhea, unspecified: Secondary | ICD-10-CM

## 2019-07-13 DIAGNOSIS — I10 Essential (primary) hypertension: Secondary | ICD-10-CM | POA: Diagnosis not present

## 2019-07-13 DIAGNOSIS — E1165 Type 2 diabetes mellitus with hyperglycemia: Secondary | ICD-10-CM | POA: Diagnosis not present

## 2019-07-13 DIAGNOSIS — E7800 Pure hypercholesterolemia, unspecified: Secondary | ICD-10-CM | POA: Diagnosis not present

## 2019-07-25 DIAGNOSIS — M7989 Other specified soft tissue disorders: Secondary | ICD-10-CM | POA: Diagnosis not present

## 2019-07-25 DIAGNOSIS — M79672 Pain in left foot: Secondary | ICD-10-CM | POA: Diagnosis not present

## 2019-07-25 NOTE — Progress Notes (Deleted)
NEUROLOGY CONSULTATION NOTE  Paula Hamilton MRN: [id number] DOB: 10/22/1946  Referring provider: Crist Fat, MD Primary care provider: Crist Fat, MD  Reason for consult:  Stroke  HISTORY OF PRESENT ILLNESS: Paula Hamilton is a 69 year old ***-handed white female with COPD, CAD, HTN, HLD, type 2 diabetes mellitus who presents for stroke.  History supplemented by PCP notes, hospital records, and prior neurologist's note.    PAST MEDICAL HISTORY: Past Medical History:  Diagnosis Date  . 3rd cranial nerve palsy, right    just start 03/13/2017  . Anxiety   . Arthritis    listhesis, instability  . Asthma   . Bronchitis    just finished Z pack and is taking cough syrup with Codiene for cough  . CAD (coronary artery disease)   . Chronic pain   . COPD (chronic obstructive pulmonary disease) (HCC)   . Depression   . Diabetes mellitus without complication (HCC)    fasting 270s  . Diabetic retinopathy (HCC)   . Dysphagia   . Fibromyalgia   . GERD (gastroesophageal reflux disease)   . Headache(784.0)    last migraine > 1 yr.   Marland Kitchen Heart murmur    mentioned yrs. ago, never mentioned again.   Marland Kitchen History of colon polyps   . Hx of migraines   . Hypercholesterolemia   . Hypertension   . Mental disorder    bipolar disorder  . Neuropathy   . Pneumonia    6 years ago  . PONV (postoperative nausea and vomiting)    has had other surgeries with no problems  . Seizures (HCC) 09/2018  . Shortness of breath   . Stroke [facility]) 09/15/2018    PAST SURGICAL HISTORY: Past Surgical History:  Procedure Laterality Date  . ABDOMINAL HYSTERECTOMY    . APPENDECTOMY    . BACK SURGERY     lower back 4 time, tens surgery too  . CERVICAL FUSION    . COLONOSCOPY W/ BIOPSIES AND POLYPECTOMY  11/21/2014   Colonic polyp status post polypectomy. benign. Mild sigmoid diverticulosis.  Marland Kitchen ESOPHAGOGASTRODUODENOSCOPY  02/26/2016   Moderate gastritis. Otherwise normal EGD   .  ESOPHAGOGASTRODUODENOSCOPY  08/24/20117   Mioderate gastritis. OTherwise normal EGD.   Marland Kitchen EYE SURGERY     laser surgery on left eye   . MAXIMUM ACCESS (MAS)POSTERIOR LUMBAR INTERBODY FUSION (PLIF) 1 LEVEL N/A 04/12/2013   Procedure: Lumba four-five maximum access surgery Posterior Lumbar Interbody Fusion;  Surgeon: Tia Alert, MD;  Location: [facility];  Service: Neurosurgery;  Laterality: N/A;  Lumba four-five maximum access surgery Posterior Lumbar Interbody Fusion  . SPINAL CORD STIMULATOR INSERTION N/A 06/14/2014   Procedure: LUMBAR SPINAL CORD STIMULATOR INSERTION;  Surgeon: Gwynne Edinger, MD;  Location: [facility];  Service: Neurosurgery;  Laterality: N/A;  . SPINAL CORD STIMULATOR REMOVAL N/A 04/12/2017   Procedure: LUMBAR SPINAL CORD STIMULATOR REMOVAL;  Surgeon: Odette Fraction, MD;  Location: [facility];  Service: Neurosurgery;  Laterality: N/A;  . TUBAL LIGATION      MEDICATIONS: Current Outpatient Medications on File Prior to Visit  Medication Sig Dispense Refill  . acetaminophen (TYLENOL) 500 MG tablet Take 1,000 mg by mouth every 6 (six) hours as needed for mild pain.    Marland Kitchen albuterol (PROVENTIL HFA;VENTOLIN HFA) 108 (90 BASE) MCG/ACT inhaler Inhale 2 puffs into the lungs every 6 (six) hours as needed for wheezing.    Marland Kitchen atorvastatin (LIPITOR) 20 MG tablet Take 20 mg by mouth  2 (two) times daily.    . cloNIDine (CATAPRES) 0.1 MG tablet 0.1 mg. If BP > 160    . diphenhydrAMINE (BENADRYL) 25 mg capsule Take 25 mg by mouth at bedtime as needed.     . diphenoxylate-atropine (LOMOTIL) 2.5-0.025 MG tablet Take 1 tablet by mouth 3 (three) times daily as needed for diarrhea or loose stools. 90 tablet 1  . docusate sodium (COLACE) 100 MG capsule Take 100 mg by mouth daily as needed for mild constipation.     . DULoxetine (CYMBALTA) 60 MG capsule Take 60 mg by mouth 2 (two) times daily.     . Insulin Degludec-Liraglutide (XULTOPHY) 100-3.6 UNIT-MG/ML SOPN Inject 34 Units into the skin daily.      Marland Kitchen levETIRAcetam (KEPPRA) 500 MG tablet Take 1 tablet (500 mg total) by mouth 2 (two) times daily. 180 tablet 4  . lisinopril (PRINIVIL,ZESTRIL) 40 MG tablet 40 mg daily.    . metFORMIN (GLUCOPHAGE-XR) 500 MG 24 hr tablet 1,000 mg 2 (two) times daily.    Marland Kitchen NOVOLOG FLEXPEN 100 UNIT/ML FlexPen 6 Units. At meal time    . omeprazole (PRILOSEC) 40 MG capsule Take 40 mg by mouth daily.    . traMADol (ULTRAM) 50 MG tablet Take by mouth every 8 (eight) hours as needed.    . traZODone (DESYREL) 50 MG tablet Take 50 mg by mouth at bedtime.     No current facility-administered medications on file prior to visit.    ALLERGIES: Allergies  Allergen Reactions  . Codeine Rash and Swelling  . Penicillins Rash    PATIENT HAS HAD A PCN REACTION WITH IMMEDIATE RASH, FACIAL/TONGUE/THROAT SWELLING, SOB, OR LIGHTHEADEDNESS WITH HYPOTENSION:  #  #  #  YES  #  #  #   Has patient had a PCN reaction causing severe rash involving mucus membranes or skin necrosis: No Has patient had a PCN reaction that required hospitalization: No Has patient had a PCN reaction occurring within the last 10 years: No   . Pregabalin Other (See Comments)    Suicidal thoughts, extreme depression  . Erythromycin Base Nausea And Vomiting    STOMACH PAIN  . Morphine And Related Nausea And Vomiting    Has taken in pill form w/o side effects    FAMILY HISTORY: Family History  Problem Relation Age of Onset  . Breast cancer Mother   . Lung cancer Mother   . Colon cancer Paternal Uncle   . Kidney cancer Paternal Uncle   . Stomach cancer Paternal Aunt   . Stroke Sister        in 2  . Diabetes Sister   . Stroke Brother        in 2  . Diabetes Brother   . Diabetes Brother   . Esophageal cancer Neg Hx    ***.  SOCIAL HISTORY: Social History   Socioeconomic History  . Marital status: Widowed    Spouse name: Not on file  . Number of children: 2  . Years of education: Not on file  . Highest education level: Some college,  no degree  Occupational History    Comment: retired  Tobacco Use  . Smoking status: Former Smoker    Packs/day: 1.00    Years: 30.00    Pack years: 30.00    Types: Cigarettes    Quit date: 05/22/2011    Years since quitting: 8.1  . Smokeless tobacco: Never Used  Substance and Sexual Activity  . Alcohol use: No  .  Drug use: No  . Sexual activity: Not on file  Other Topics Concern  . Not on file  Social History Narrative   Lives with sister, bro-in-law   Caffeine- not much   Social Determinants of Health   Financial Resource Strain:   . Difficulty of Paying Living Expenses: Not on file  Food Insecurity:   . Worried About Charity fundraiser in the Last Year: Not on file  . Ran Out of Food in the Last Year: Not on file  Transportation Needs:   . Lack of Transportation (Medical): Not on file  . Lack of Transportation (Non-Medical): Not on file  Physical Activity:   . Days of Exercise per Week: Not on file  . Minutes of Exercise per Session: Not on file  Stress:   . Feeling of Stress : Not on file  Social Connections:   . Frequency of Communication with Friends and Family: Not on file  . Frequency of Social Gatherings with Friends and Family: Not on file  . Attends Religious Services: Not on file  . Active Member of Clubs or Organizations: Not on file  . Attends Archivist Meetings: Not on file  . Marital Status: Not on file  Intimate Partner Violence:   . Fear of Current or Ex-Partner: Not on file  . Emotionally Abused: Not on file  . Physically Abused: Not on file  . Sexually Abused: Not on file    REVIEW OF SYSTEMS: Constitutional: No fevers, chills, or sweats, no generalized fatigue, change in appetite Eyes: No visual changes, double vision, eye pain Ear, nose and throat: No hearing loss, ear pain, nasal congestion, sore throat Cardiovascular: No chest pain, palpitations Respiratory:  No shortness of breath at rest or with exertion, wheezes  GastrointestinaI: No nausea, vomiting, diarrhea, abdominal pain, fecal incontinence Genitourinary:  No dysuria, urinary retention or frequency Musculoskeletal:  No neck pain, back pain Integumentary: No rash, pruritus, skin lesions Neurological: as above Psychiatric: No depression, insomnia, anxiety Endocrine: No palpitations, fatigue, diaphoresis, mood swings, change in appetite, change in weight, increased thirst Hematologic/Lymphatic:  No purpura, petechiae. Allergic/Immunologic: no itchy/runny eyes, nasal congestion, recent allergic reactions, rashes  PHYSICAL EXAM: *** General: No acute distress.  Patient appears ***-groomed.  *** Head:  Normocephalic/atraumatic Eyes:  fundi examined but not visualized Neck: supple, no paraspinal tenderness, full range of motion Back: No paraspinal tenderness Heart: regular rate and rhythm Lungs: Clear to auscultation bilaterally. Vascular: No carotid bruits. Neurological Exam: Mental status: alert and oriented to person, place, and time, recent and remote memory intact, fund of knowledge intact, attention and concentration intact, speech fluent and not dysarthric, language intact. Cranial nerves: CN I: not tested CN II: pupils equal, round and reactive to light, visual fields intact CN III, IV, VI:  full range of motion, no nystagmus, no ptosis CN V: facial sensation intact CN VII: upper and lower face symmetric CN VIII: hearing intact CN IX, X: gag intact, uvula midline CN XI: sternocleidomastoid and trapezius muscles intact CN XII: tongue midline Bulk & Tone: normal, no fasciculations. Motor:  5/5 throughout *** Sensation:  Pinprick *** temperature *** and vibration sensation intact.  ***. Deep Tendon Reflexes:  2+ throughout, *** toes downgoing.  *** Finger to nose testing:  Without dysmetria.  *** Heel to shin:  Without dysmetria.  *** Gait:  Normal station and stride.  Able to turn and tandem walk. Romberg ***.  IMPRESSION: ***   PLAN: ***  Thank you for allowing me to  take part in the care of this patient.  Metta Clines, DO  CC: ***

## 2019-07-26 ENCOUNTER — Ambulatory Visit: Payer: PPO | Admitting: Neurology

## 2019-07-28 DIAGNOSIS — E119 Type 2 diabetes mellitus without complications: Secondary | ICD-10-CM | POA: Diagnosis not present

## 2019-07-28 DIAGNOSIS — R2681 Unsteadiness on feet: Secondary | ICD-10-CM | POA: Diagnosis not present

## 2019-07-28 DIAGNOSIS — R29810 Facial weakness: Secondary | ICD-10-CM | POA: Diagnosis not present

## 2019-07-28 DIAGNOSIS — E1142 Type 2 diabetes mellitus with diabetic polyneuropathy: Secondary | ICD-10-CM | POA: Diagnosis not present

## 2019-07-28 DIAGNOSIS — Z7902 Long term (current) use of antithrombotics/antiplatelets: Secondary | ICD-10-CM | POA: Diagnosis not present

## 2019-07-28 DIAGNOSIS — R531 Weakness: Secondary | ICD-10-CM | POA: Diagnosis not present

## 2019-07-28 DIAGNOSIS — M8949 Other hypertrophic osteoarthropathy, multiple sites: Secondary | ICD-10-CM | POA: Diagnosis not present

## 2019-07-28 DIAGNOSIS — S92912A Unspecified fracture of left toe(s), initial encounter for closed fracture: Secondary | ICD-10-CM | POA: Diagnosis not present

## 2019-07-28 DIAGNOSIS — Z885 Allergy status to narcotic agent status: Secondary | ICD-10-CM | POA: Diagnosis not present

## 2019-07-28 DIAGNOSIS — F319 Bipolar disorder, unspecified: Secondary | ICD-10-CM | POA: Diagnosis not present

## 2019-07-28 DIAGNOSIS — R296 Repeated falls: Secondary | ICD-10-CM | POA: Diagnosis not present

## 2019-07-28 DIAGNOSIS — Z881 Allergy status to other antibiotic agents status: Secondary | ICD-10-CM | POA: Diagnosis not present

## 2019-07-28 DIAGNOSIS — K219 Gastro-esophageal reflux disease without esophagitis: Secondary | ICD-10-CM | POA: Diagnosis not present

## 2019-07-28 DIAGNOSIS — S9032XA Contusion of left foot, initial encounter: Secondary | ICD-10-CM | POA: Diagnosis not present

## 2019-07-28 DIAGNOSIS — Z7982 Long term (current) use of aspirin: Secondary | ICD-10-CM | POA: Diagnosis not present

## 2019-07-28 DIAGNOSIS — Z79899 Other long term (current) drug therapy: Secondary | ICD-10-CM | POA: Diagnosis not present

## 2019-07-28 DIAGNOSIS — J9811 Atelectasis: Secondary | ICD-10-CM | POA: Diagnosis not present

## 2019-07-28 DIAGNOSIS — Z794 Long term (current) use of insulin: Secondary | ICD-10-CM | POA: Diagnosis not present

## 2019-07-28 DIAGNOSIS — F17210 Nicotine dependence, cigarettes, uncomplicated: Secondary | ICD-10-CM | POA: Diagnosis not present

## 2019-07-28 DIAGNOSIS — I10 Essential (primary) hypertension: Secondary | ICD-10-CM | POA: Diagnosis not present

## 2019-07-28 DIAGNOSIS — I2510 Atherosclerotic heart disease of native coronary artery without angina pectoris: Secondary | ICD-10-CM

## 2019-07-28 DIAGNOSIS — G459 Transient cerebral ischemic attack, unspecified: Secondary | ICD-10-CM | POA: Diagnosis not present

## 2019-07-28 DIAGNOSIS — Z880 Allergy status to penicillin: Secondary | ICD-10-CM | POA: Diagnosis not present

## 2019-07-28 DIAGNOSIS — R200 Anesthesia of skin: Secondary | ICD-10-CM | POA: Diagnosis not present

## 2019-07-28 DIAGNOSIS — E7800 Pure hypercholesterolemia, unspecified: Secondary | ICD-10-CM | POA: Diagnosis not present

## 2019-07-28 DIAGNOSIS — Z8673 Personal history of transient ischemic attack (TIA), and cerebral infarction without residual deficits: Secondary | ICD-10-CM | POA: Diagnosis not present

## 2019-07-28 DIAGNOSIS — J449 Chronic obstructive pulmonary disease, unspecified: Secondary | ICD-10-CM

## 2019-07-28 DIAGNOSIS — Z888 Allergy status to other drugs, medicaments and biological substances status: Secondary | ICD-10-CM | POA: Diagnosis not present

## 2019-07-28 DIAGNOSIS — I361 Nonrheumatic tricuspid (valve) insufficiency: Secondary | ICD-10-CM | POA: Diagnosis not present

## 2019-07-28 DIAGNOSIS — E785 Hyperlipidemia, unspecified: Secondary | ICD-10-CM | POA: Diagnosis not present

## 2019-07-29 DIAGNOSIS — E785 Hyperlipidemia, unspecified: Secondary | ICD-10-CM | POA: Diagnosis not present

## 2019-07-29 DIAGNOSIS — I10 Essential (primary) hypertension: Secondary | ICD-10-CM | POA: Diagnosis not present

## 2019-07-29 DIAGNOSIS — I2510 Atherosclerotic heart disease of native coronary artery without angina pectoris: Secondary | ICD-10-CM | POA: Diagnosis not present

## 2019-07-29 DIAGNOSIS — J449 Chronic obstructive pulmonary disease, unspecified: Secondary | ICD-10-CM | POA: Diagnosis not present

## 2019-07-29 DIAGNOSIS — I6523 Occlusion and stenosis of bilateral carotid arteries: Secondary | ICD-10-CM | POA: Diagnosis not present

## 2019-07-29 DIAGNOSIS — E119 Type 2 diabetes mellitus without complications: Secondary | ICD-10-CM | POA: Diagnosis not present

## 2019-07-29 DIAGNOSIS — G459 Transient cerebral ischemic attack, unspecified: Secondary | ICD-10-CM | POA: Diagnosis not present

## 2019-07-30 ENCOUNTER — Ambulatory Visit: Payer: PPO | Admitting: Neurology

## 2019-07-30 DIAGNOSIS — R200 Anesthesia of skin: Secondary | ICD-10-CM | POA: Diagnosis not present

## 2019-07-30 DIAGNOSIS — E785 Hyperlipidemia, unspecified: Secondary | ICD-10-CM | POA: Diagnosis not present

## 2019-07-30 DIAGNOSIS — I10 Essential (primary) hypertension: Secondary | ICD-10-CM | POA: Diagnosis not present

## 2019-07-30 DIAGNOSIS — R4781 Slurred speech: Secondary | ICD-10-CM | POA: Diagnosis not present

## 2019-07-30 DIAGNOSIS — I2510 Atherosclerotic heart disease of native coronary artery without angina pectoris: Secondary | ICD-10-CM | POA: Diagnosis not present

## 2019-07-30 DIAGNOSIS — E119 Type 2 diabetes mellitus without complications: Secondary | ICD-10-CM | POA: Diagnosis not present

## 2019-07-30 DIAGNOSIS — G459 Transient cerebral ischemic attack, unspecified: Secondary | ICD-10-CM | POA: Diagnosis not present

## 2019-07-30 DIAGNOSIS — J449 Chronic obstructive pulmonary disease, unspecified: Secondary | ICD-10-CM | POA: Diagnosis not present

## 2019-08-01 DIAGNOSIS — Z794 Long term (current) use of insulin: Secondary | ICD-10-CM | POA: Diagnosis not present

## 2019-08-01 DIAGNOSIS — E785 Hyperlipidemia, unspecified: Secondary | ICD-10-CM | POA: Diagnosis not present

## 2019-08-01 DIAGNOSIS — E1165 Type 2 diabetes mellitus with hyperglycemia: Secondary | ICD-10-CM | POA: Diagnosis not present

## 2019-08-01 DIAGNOSIS — Z8673 Personal history of transient ischemic attack (TIA), and cerebral infarction without residual deficits: Secondary | ICD-10-CM | POA: Diagnosis not present

## 2019-08-01 DIAGNOSIS — E11319 Type 2 diabetes mellitus with unspecified diabetic retinopathy without macular edema: Secondary | ICD-10-CM | POA: Diagnosis not present

## 2019-08-01 DIAGNOSIS — J449 Chronic obstructive pulmonary disease, unspecified: Secondary | ICD-10-CM | POA: Diagnosis not present

## 2019-08-01 DIAGNOSIS — M797 Fibromyalgia: Secondary | ICD-10-CM | POA: Diagnosis not present

## 2019-08-01 DIAGNOSIS — E1140 Type 2 diabetes mellitus with diabetic neuropathy, unspecified: Secondary | ICD-10-CM | POA: Diagnosis not present

## 2019-08-01 DIAGNOSIS — F17210 Nicotine dependence, cigarettes, uncomplicated: Secondary | ICD-10-CM | POA: Diagnosis not present

## 2019-08-01 DIAGNOSIS — S92515D Nondisplaced fracture of proximal phalanx of left lesser toe(s), subsequent encounter for fracture with routine healing: Secondary | ICD-10-CM | POA: Diagnosis not present

## 2019-08-01 DIAGNOSIS — F319 Bipolar disorder, unspecified: Secondary | ICD-10-CM | POA: Diagnosis not present

## 2019-08-01 DIAGNOSIS — I2510 Atherosclerotic heart disease of native coronary artery without angina pectoris: Secondary | ICD-10-CM | POA: Diagnosis not present

## 2019-08-01 DIAGNOSIS — I10 Essential (primary) hypertension: Secondary | ICD-10-CM | POA: Diagnosis not present

## 2019-08-01 DIAGNOSIS — M1991 Primary osteoarthritis, unspecified site: Secondary | ICD-10-CM | POA: Diagnosis not present

## 2019-08-01 DIAGNOSIS — K5790 Diverticulosis of intestine, part unspecified, without perforation or abscess without bleeding: Secondary | ICD-10-CM | POA: Diagnosis not present

## 2019-08-01 DIAGNOSIS — I679 Cerebrovascular disease, unspecified: Secondary | ICD-10-CM | POA: Diagnosis not present

## 2019-08-01 DIAGNOSIS — G459 Transient cerebral ischemic attack, unspecified: Secondary | ICD-10-CM | POA: Diagnosis not present

## 2019-08-01 DIAGNOSIS — G894 Chronic pain syndrome: Secondary | ICD-10-CM | POA: Diagnosis not present

## 2019-08-01 DIAGNOSIS — S90822D Blister (nonthermal), left foot, subsequent encounter: Secondary | ICD-10-CM | POA: Diagnosis not present

## 2019-08-01 DIAGNOSIS — Z7982 Long term (current) use of aspirin: Secondary | ICD-10-CM | POA: Diagnosis not present

## 2019-08-01 DIAGNOSIS — K219 Gastro-esophageal reflux disease without esophagitis: Secondary | ICD-10-CM | POA: Diagnosis not present

## 2019-08-02 DIAGNOSIS — E1165 Type 2 diabetes mellitus with hyperglycemia: Secondary | ICD-10-CM | POA: Diagnosis not present

## 2019-08-02 DIAGNOSIS — I679 Cerebrovascular disease, unspecified: Secondary | ICD-10-CM | POA: Diagnosis not present

## 2019-08-02 DIAGNOSIS — I10 Essential (primary) hypertension: Secondary | ICD-10-CM | POA: Diagnosis not present

## 2019-08-03 DIAGNOSIS — E113593 Type 2 diabetes mellitus with proliferative diabetic retinopathy without macular edema, bilateral: Secondary | ICD-10-CM | POA: Diagnosis not present

## 2019-08-29 DIAGNOSIS — E113592 Type 2 diabetes mellitus with proliferative diabetic retinopathy without macular edema, left eye: Secondary | ICD-10-CM | POA: Diagnosis not present

## 2019-08-31 DIAGNOSIS — F17210 Nicotine dependence, cigarettes, uncomplicated: Secondary | ICD-10-CM | POA: Diagnosis not present

## 2019-08-31 DIAGNOSIS — I10 Essential (primary) hypertension: Secondary | ICD-10-CM | POA: Diagnosis not present

## 2019-08-31 DIAGNOSIS — E785 Hyperlipidemia, unspecified: Secondary | ICD-10-CM | POA: Diagnosis not present

## 2019-08-31 DIAGNOSIS — Z8673 Personal history of transient ischemic attack (TIA), and cerebral infarction without residual deficits: Secondary | ICD-10-CM | POA: Diagnosis not present

## 2019-08-31 DIAGNOSIS — I679 Cerebrovascular disease, unspecified: Secondary | ICD-10-CM | POA: Diagnosis not present

## 2019-08-31 DIAGNOSIS — J449 Chronic obstructive pulmonary disease, unspecified: Secondary | ICD-10-CM | POA: Diagnosis not present

## 2019-08-31 DIAGNOSIS — K219 Gastro-esophageal reflux disease without esophagitis: Secondary | ICD-10-CM | POA: Diagnosis not present

## 2019-08-31 DIAGNOSIS — S92515D Nondisplaced fracture of proximal phalanx of left lesser toe(s), subsequent encounter for fracture with routine healing: Secondary | ICD-10-CM | POA: Diagnosis not present

## 2019-08-31 DIAGNOSIS — Z7982 Long term (current) use of aspirin: Secondary | ICD-10-CM | POA: Diagnosis not present

## 2019-08-31 DIAGNOSIS — Z794 Long term (current) use of insulin: Secondary | ICD-10-CM | POA: Diagnosis not present

## 2019-08-31 DIAGNOSIS — M1991 Primary osteoarthritis, unspecified site: Secondary | ICD-10-CM | POA: Diagnosis not present

## 2019-08-31 DIAGNOSIS — E11319 Type 2 diabetes mellitus with unspecified diabetic retinopathy without macular edema: Secondary | ICD-10-CM | POA: Diagnosis not present

## 2019-08-31 DIAGNOSIS — E1140 Type 2 diabetes mellitus with diabetic neuropathy, unspecified: Secondary | ICD-10-CM | POA: Diagnosis not present

## 2019-08-31 DIAGNOSIS — M797 Fibromyalgia: Secondary | ICD-10-CM | POA: Diagnosis not present

## 2019-08-31 DIAGNOSIS — G894 Chronic pain syndrome: Secondary | ICD-10-CM | POA: Diagnosis not present

## 2019-08-31 DIAGNOSIS — K5790 Diverticulosis of intestine, part unspecified, without perforation or abscess without bleeding: Secondary | ICD-10-CM | POA: Diagnosis not present

## 2019-08-31 DIAGNOSIS — I2510 Atherosclerotic heart disease of native coronary artery without angina pectoris: Secondary | ICD-10-CM | POA: Diagnosis not present

## 2019-08-31 DIAGNOSIS — S90822D Blister (nonthermal), left foot, subsequent encounter: Secondary | ICD-10-CM | POA: Diagnosis not present

## 2019-08-31 DIAGNOSIS — F319 Bipolar disorder, unspecified: Secondary | ICD-10-CM | POA: Diagnosis not present

## 2019-08-31 DIAGNOSIS — E1165 Type 2 diabetes mellitus with hyperglycemia: Secondary | ICD-10-CM | POA: Diagnosis not present

## 2019-09-05 DIAGNOSIS — E113591 Type 2 diabetes mellitus with proliferative diabetic retinopathy without macular edema, right eye: Secondary | ICD-10-CM | POA: Diagnosis not present

## 2019-09-06 NOTE — Progress Notes (Addendum)
NEUROLOGY CONSULTATION NOTE  DANNIELLA ROBBEN MRN: [id number] DOB: September 12, 1950  Referring provider: Crist Fat, MD Primary care provider: Crist Fat, MD  Reason for consult:  stroke  HISTORY OF PRESENT ILLNESS: Paula Hamilton is a 69 year old right-handed white female with CAD, COPD, type 2 diabetes mellitus, hyperlipidemia, and chronic pain syndrome who presents for stroke.  History supplemented by referring provider and prior neurologist notes.  She was in [facility] on 09/15/2018 when she had sudden onset confusion, slurred speech and right sided weakness.  Blood pressure at home was 245/110 and blood glucose 341.  She presented to a local ED  where she received IV tPA and transferred to [facility].  CT head showed possible stroke.  Echocardiogram and carotid doppler reportedly unremarkable.  She had two other habitual episodes during hospitalization which was concerning for possible seizures.  She was discharged on Keppra 500mg  twice daily.  She followed up with outpatient neurology in July who recommended continuing ASA and Keppra.    She was again in [facility] on 05/08/2019 when she developed sudden onset of slurred speech, left facial weakness and left arm and leg numbness and weakness.  No confusion.  Symptoms resolved by the time she got to the hospital.  CT of head showed no acute abnormality, however MRI of brain showed acute right frontal deep white matter infarct.  Plavix was added to her ASA 81mg  regimen with instructions to continue Plavix 75mg  daily alone after 21 days.  Later on Thanksgiving, she was on the commode having a bowel movement and couldn't move her right leg.  She returned to the hospital where she was told that she had another stroke.  No change in management.  She didn't regain complete strength for a couple of days.  Hgb A1c at that time was 8.8%.  She had been noncompliant.  Her PCP, Dr. , restarted her on Lipitor 40mg  daily.  LDL in  January 2021 was 108.  In early January 2021, she went to [facility] for after another episode of unilateral numbness and weakness.  Hospital records are not available.    Current medications:  Plavix 75mg ; Lipitor 20mg  twice daily; clonidine; lisinopril; metformin; Lomotil; Keppra 500mg  twice daily  History of chronic back pain s/p several surgeries with history of spinal stimulator.  It causes weakness due to pain.   PAST MEDICAL HISTORY: Past Medical History:  Diagnosis Date  . 3rd cranial nerve palsy, right    just start 03/13/2017  . Anxiety   . Arthritis    listhesis, instability  . Asthma   . Bronchitis    just finished Z pack and is taking cough syrup with Codiene for cough  . CAD (coronary artery disease)   . Chronic pain   . COPD (chronic obstructive pulmonary disease) (HCC)   . Depression   . Diabetes mellitus without complication (HCC)    fasting 270s  . Diabetic retinopathy (HCC)   . Dysphagia   . Fibromyalgia   . GERD (gastroesophageal reflux disease)   . Headache(784.0)    last migraine > 1 yr.   February 2021 Heart murmur    mentioned yrs. ago, never mentioned again.   09-24-1971 History of colon polyps   . Hx of migraines   . Hypercholesterolemia   . Hypertension   . Mental disorder    bipolar disorder  . Neuropathy   . Pneumonia    6 years ago  . PONV (postoperative nausea  and vomiting)    has had other surgeries with no problems  . Seizures (HCC) 09/2018  . Shortness of breath   . Stroke [facility]) 09/15/2018    PAST SURGICAL HISTORY: Past Surgical History:  Procedure Laterality Date  . ABDOMINAL HYSTERECTOMY    . APPENDECTOMY    . BACK SURGERY     lower back 4 time, tens surgery too  . CERVICAL FUSION    . COLONOSCOPY W/ BIOPSIES AND POLYPECTOMY  11/21/2014   Colonic polyp status post polypectomy. benign. Mild sigmoid diverticulosis.  Marland Kitchen ESOPHAGOGASTRODUODENOSCOPY  02/26/2016   Moderate gastritis. Otherwise normal EGD   . ESOPHAGOGASTRODUODENOSCOPY   08/24/20117   Mioderate gastritis. OTherwise normal EGD.   Marland Kitchen EYE SURGERY     laser surgery on left eye   . MAXIMUM ACCESS (MAS)POSTERIOR LUMBAR INTERBODY FUSION (PLIF) 1 LEVEL N/A 04/12/2013   Procedure: Lumba four-five maximum access surgery Posterior Lumbar Interbody Fusion;  Surgeon: Tia Alert, MD;  Location: [facility] NEURO ORS;  Service: Neurosurgery;  Laterality: N/A;  Lumba four-five maximum access surgery Posterior Lumbar Interbody Fusion  . SPINAL CORD STIMULATOR INSERTION N/A 06/14/2014   Procedure: LUMBAR SPINAL CORD STIMULATOR INSERTION;  Surgeon: Gwynne Edinger, MD;  Location: [facility] NEURO ORS;  Service: Neurosurgery;  Laterality: N/A;  . SPINAL CORD STIMULATOR REMOVAL N/A 04/12/2017   Procedure: LUMBAR SPINAL CORD STIMULATOR REMOVAL;  Surgeon: Odette Fraction, MD;  Location: [facility];  Service: Neurosurgery;  Laterality: N/A;  . TUBAL LIGATION      MEDICATIONS: Current Outpatient Medications on File Prior to Visit  Medication Sig Dispense Refill  . acetaminophen (TYLENOL) 500 MG tablet Take 1,000 mg by mouth every 6 (six) hours as needed for mild pain.    Marland Kitchen albuterol (PROVENTIL HFA;VENTOLIN HFA) 108 (90 BASE) MCG/ACT inhaler Inhale 2 puffs into the lungs every 6 (six) hours as needed for wheezing.    Marland Kitchen atorvastatin (LIPITOR) 20 MG tablet Take 20 mg by mouth 2 (two) times daily.    . cloNIDine (CATAPRES) 0.1 MG tablet 0.1 mg. If BP > 160    . diphenhydrAMINE (BENADRYL) 25 mg capsule Take 25 mg by mouth at bedtime as needed.     . diphenoxylate-atropine (LOMOTIL) 2.5-0.025 MG tablet Take 1 tablet by mouth 3 (three) times daily as needed for diarrhea or loose stools. 90 tablet 1  . docusate sodium (COLACE) 100 MG capsule Take 100 mg by mouth daily as needed for mild constipation.     . DULoxetine (CYMBALTA) 60 MG capsule Take 60 mg by mouth 2 (two) times daily.     . Insulin Degludec-Liraglutide (XULTOPHY) 100-3.6 UNIT-MG/ML SOPN Inject 34 Units into the skin daily.     Marland Kitchen levETIRAcetam (KEPPRA)  500 MG tablet Take 1 tablet (500 mg total) by mouth 2 (two) times daily. 180 tablet 4  . lisinopril (PRINIVIL,ZESTRIL) 40 MG tablet 40 mg daily.    . metFORMIN (GLUCOPHAGE-XR) 500 MG 24 hr tablet 1,000 mg 2 (two) times daily.    Marland Kitchen NOVOLOG FLEXPEN 100 UNIT/ML FlexPen 6 Units. At meal time    . omeprazole (PRILOSEC) 40 MG capsule Take 40 mg by mouth daily.    . traMADol (ULTRAM) 50 MG tablet Take by mouth every 8 (eight) hours as needed.    . traZODone (DESYREL) 50 MG tablet Take 50 mg by mouth at bedtime.     No current facility-administered medications on file prior to visit.    ALLERGIES: Allergies  Allergen Reactions  . Codeine Rash and Swelling  .  Penicillins Rash    PATIENT HAS HAD A PCN REACTION WITH IMMEDIATE RASH, FACIAL/TONGUE/THROAT SWELLING, SOB, OR LIGHTHEADEDNESS WITH HYPOTENSION:  #  #  #  YES  #  #  #   Has patient had a PCN reaction causing severe rash involving mucus membranes or skin necrosis: No Has patient had a PCN reaction that required hospitalization: No Has patient had a PCN reaction occurring within the last 10 years: No   . Pregabalin Other (See Comments)    Suicidal thoughts, extreme depression  . Erythromycin Base Nausea And Vomiting    STOMACH PAIN  . Morphine And Related Nausea And Vomiting    Has taken in pill form w/o side effects    FAMILY HISTORY: Family History  Problem Relation Age of Onset  . Breast cancer Mother   . Lung cancer Mother   . Colon cancer Paternal Uncle   . Kidney cancer Paternal Uncle   . Stomach cancer Paternal Aunt   . Stroke Sister        in 2  . Diabetes Sister   . Stroke Brother        in 2  . Diabetes Brother   . Diabetes Brother   . Esophageal cancer Neg Hx     SOCIAL HISTORY: Social History   Socioeconomic History  . Marital status: Widowed    Spouse name: Not on file  . Number of children: 2  . Years of education: Not on file  . Highest education level: Some college, no degree  Occupational History     Comment: retired  Tobacco Use  . Smoking status: Former Smoker    Packs/day: 1.00    Years: 30.00    Pack years: 30.00    Types: Cigarettes    Quit date: 05/22/2011    Years since quitting: 8.2  . Smokeless tobacco: Never Used  Substance and Sexual Activity  . Alcohol use: No  . Drug use: No  . Sexual activity: Not on file  Other Topics Concern  . Not on file  Social History Narrative   Lives with sister, bro-in-law   Caffeine- not much   Social Determinants of Health   Financial Resource Strain:   . Difficulty of Paying Living Expenses: Not on file  Food Insecurity:   . Worried About Programme researcher, broadcasting/film/video in the Last Year: Not on file  . Ran Out of Food in the Last Year: Not on file  Transportation Needs:   . Lack of Transportation (Medical): Not on file  . Lack of Transportation (Non-Medical): Not on file  Physical Activity:   . Days of Exercise per Week: Not on file  . Minutes of Exercise per Session: Not on file  Stress:   . Feeling of Stress : Not on file  Social Connections:   . Frequency of Communication with Friends and Family: Not on file  . Frequency of Social Gatherings with Friends and Family: Not on file  . Attends Religious Services: Not on file  . Active Member of Clubs or Organizations: Not on file  . Attends Banker Meetings: Not on file  . Marital Status: Not on file  Intimate Partner Violence:   . Fear of Current or Ex-Partner: Not on file  . Emotionally Abused: Not on file  . Physically Abused: Not on file  . Sexually Abused: Not on file    REVIEW OF SYSTEMS: Constitutional: No fevers, chills, or sweats, no generalized fatigue, change in appetite Eyes: No visual  changes, double vision, eye pain Ear, nose and throat: No hearing loss, ear pain, nasal congestion, sore throat Cardiovascular: No chest pain, palpitations Respiratory:  No shortness of breath at rest or with exertion, wheezes GastrointestinaI: No nausea, vomiting,  diarrhea, abdominal pain, fecal incontinence Genitourinary:  No dysuria, urinary retention or frequency Musculoskeletal:  No neck pain, back pain Integumentary: No rash, pruritus, skin lesions Neurological: as above Psychiatric: No depression, insomnia, anxiety Endocrine: No palpitations, fatigue, diaphoresis, mood swings, change in appetite, change in weight, increased thirst Hematologic/Lymphatic:  No purpura, petechiae. Allergic/Immunologic: no itchy/runny eyes, nasal congestion, recent allergic reactions, rashes  PHYSICAL EXAM: Blood pressure 132/80, pulse 62, temperature 97.6 F (36.4 C), height 5\' 4"  (1.626 m), weight 145 lb (65.8 kg), SpO2 98 %. General: No acute distress.  Patient appears well-groomed.  Head:  Normocephalic/atraumatic Eyes:  fundi examined but not visualized Neck: supple, no paraspinal tenderness, full range of motion Back: No paraspinal tenderness Heart: regular rate and rhythm Lungs: Clear to auscultation bilaterally. Vascular: No carotid bruits. Neurological Exam: Mental status: alert and oriented to person, place, and time, recent and remote memory intact, fund of knowledge intact, attention and concentration intact, speech fluent and not dysarthric, language intact. Cranial nerves: CN I: not tested CN II: bilateral surgical pupils (OD > OS), round and poorly reactive;  bilateral peripheral vision loss CN III, IV, VI:  full range of motion, no nystagmus, no ptosis CN V: facial sensation intact CN VII: upper and lower face symmetric CN VIII: hearing intact CN IX, X: gag intact, uvula midline CN XI: sternocleidomastoid and trapezius muscles intact CN XII: tongue midline Bulk & Tone: normal, no fasciculations. Motor:  5-/5 bilateral hip flexion, otherwise 5/5. Sensation:  Pinprick sensation and vibration sensation reduced in feet. Deep Tendon Reflexes:  2+ throughout, toes downgoing.  Finger to nose testing:  Without dysmetria.  Heel to shin:  Without  dysmetria.  Gait:  Antalgic (walking with boot due to foot fracture).  Romberg positive.  IMPRESSION: 1.  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  At least one of these events was clearly a CVA, supporting that these events are all cerebrovascular 2.  Hypertension 3.  Hyperlipidemia 4.  Type 2 diabetes mellitus  PLAN: 1.  I would continue Plavix 75mg  daily for secondary stroke prevention. 2.  Continue atorvastatin 40mg  daily.  Check lipid panel.  LDL goal less than 70. 3.  Continue blood pressure control 4.  Continue optimal glycemic control (Hgb A1c goal less than 7) 5.  Will need to obtain records from each of her hospital visits to review tests.  Given that she has had 4 events over the course of a year on maximal medical management, I would consider implantable loop recorder to evaluate for underlying paroxysmal atrial fibrillation. 6.  Would still continue Keppra 500mg  twice daily for now. 7.  Follow up in 4 months.  Thank you for allowing me to take part in the care of this patient.  Metta Clines, DO  CC:  Townsend Roger, MD   ADDENDUM: [facility] 07/29/2019: CAROTID US:  Less than 50% stenosis of right ICA; 50-69% stenosis of left ICA but given grayscale appearance, estimate is likely closer to 50%; antegrade flow within bilateral vertebral arteries. ECHO:  EF 60-65%; no ASD/VSD MRI BRAIN:  Chronic small vessel ischemia; no acute or reversible finding. LABS:  LDL 93.4; HGB A1c 7.4 Discharged on ASA 325mg  and Plavix 75mg ; continue Lipitor 80mg ; continue metformin and basal insulin and provided with sliding  scale Shon Millet, DO

## 2019-09-07 ENCOUNTER — Other Ambulatory Visit (INDEPENDENT_AMBULATORY_CARE_PROVIDER_SITE_OTHER): Payer: PPO

## 2019-09-07 ENCOUNTER — Ambulatory Visit (INDEPENDENT_AMBULATORY_CARE_PROVIDER_SITE_OTHER): Payer: PPO | Admitting: Neurology

## 2019-09-07 ENCOUNTER — Encounter: Payer: Self-pay | Admitting: Neurology

## 2019-09-07 ENCOUNTER — Other Ambulatory Visit: Payer: Self-pay

## 2019-09-07 VITALS — BP 132/80 | HR 62 | Temp 97.6°F | Ht 64.0 in | Wt 145.0 lb

## 2019-09-07 DIAGNOSIS — E1169 Type 2 diabetes mellitus with other specified complication: Secondary | ICD-10-CM | POA: Diagnosis not present

## 2019-09-07 DIAGNOSIS — I10 Essential (primary) hypertension: Secondary | ICD-10-CM

## 2019-09-07 DIAGNOSIS — I639 Cerebral infarction, unspecified: Secondary | ICD-10-CM | POA: Diagnosis not present

## 2019-09-07 DIAGNOSIS — E785 Hyperlipidemia, unspecified: Secondary | ICD-10-CM | POA: Diagnosis not present

## 2019-09-07 NOTE — Patient Instructions (Signed)
1.  Continue current medications. 2.  I will look at the hospital records.  I suspect that I will refer you to cardiology for placement of an implantable loop recorder (holter monitor) to evaluate for any arrhythmia that may be a cause of stroke. 3.  We will check lipid panel 4.  Follow up in 4 months.

## 2019-09-08 LAB — LIPID PANEL
Cholesterol: 128 mg/dL (ref <200)
HDL: 35 mg/dL — ABNORMAL LOW (ref >=50)
LDL Cholesterol (Calc): 69 mg/dL (calc)
Non-HDL Cholesterol (Calc): 93 mg/dL (calc) (ref <130)
Total CHOL/HDL Ratio: 3.7 (calc) (ref <5.0)
Triglycerides: 158 mg/dL — ABNORMAL HIGH (ref <150)

## 2019-10-15 DIAGNOSIS — E1165 Type 2 diabetes mellitus with hyperglycemia: Secondary | ICD-10-CM | POA: Diagnosis not present

## 2019-10-15 DIAGNOSIS — I679 Cerebrovascular disease, unspecified: Secondary | ICD-10-CM | POA: Diagnosis not present

## 2019-10-15 DIAGNOSIS — I10 Essential (primary) hypertension: Secondary | ICD-10-CM | POA: Diagnosis not present

## 2019-10-15 DIAGNOSIS — E7800 Pure hypercholesterolemia, unspecified: Secondary | ICD-10-CM | POA: Diagnosis not present

## 2019-10-15 DIAGNOSIS — E1140 Type 2 diabetes mellitus with diabetic neuropathy, unspecified: Secondary | ICD-10-CM | POA: Diagnosis not present

## 2019-10-19 DIAGNOSIS — F17200 Nicotine dependence, unspecified, uncomplicated: Secondary | ICD-10-CM | POA: Diagnosis not present

## 2019-10-19 DIAGNOSIS — R29810 Facial weakness: Secondary | ICD-10-CM | POA: Diagnosis not present

## 2019-10-19 DIAGNOSIS — N183 Chronic kidney disease, stage 3 unspecified: Secondary | ICD-10-CM | POA: Diagnosis not present

## 2019-10-19 DIAGNOSIS — E785 Hyperlipidemia, unspecified: Secondary | ICD-10-CM | POA: Diagnosis not present

## 2019-10-19 DIAGNOSIS — F419 Anxiety disorder, unspecified: Secondary | ICD-10-CM | POA: Diagnosis not present

## 2019-10-19 DIAGNOSIS — Z7902 Long term (current) use of antithrombotics/antiplatelets: Secondary | ICD-10-CM | POA: Diagnosis not present

## 2019-10-19 DIAGNOSIS — Z885 Allergy status to narcotic agent status: Secondary | ICD-10-CM | POA: Diagnosis not present

## 2019-10-19 DIAGNOSIS — E1122 Type 2 diabetes mellitus with diabetic chronic kidney disease: Secondary | ICD-10-CM | POA: Diagnosis not present

## 2019-10-19 DIAGNOSIS — J019 Acute sinusitis, unspecified: Secondary | ICD-10-CM | POA: Diagnosis not present

## 2019-10-19 DIAGNOSIS — R29700 NIHSS score 0: Secondary | ICD-10-CM | POA: Diagnosis not present

## 2019-10-19 DIAGNOSIS — I129 Hypertensive chronic kidney disease with stage 1 through stage 4 chronic kidney disease, or unspecified chronic kidney disease: Secondary | ICD-10-CM | POA: Diagnosis not present

## 2019-10-19 DIAGNOSIS — G459 Transient cerebral ischemic attack, unspecified: Secondary | ICD-10-CM | POA: Diagnosis not present

## 2019-10-19 DIAGNOSIS — Z20822 Contact with and (suspected) exposure to covid-19: Secondary | ICD-10-CM | POA: Diagnosis not present

## 2019-10-19 DIAGNOSIS — Z7982 Long term (current) use of aspirin: Secondary | ICD-10-CM | POA: Diagnosis not present

## 2019-10-19 DIAGNOSIS — E113593 Type 2 diabetes mellitus with proliferative diabetic retinopathy without macular edema, bilateral: Secondary | ICD-10-CM | POA: Diagnosis not present

## 2019-10-19 DIAGNOSIS — Z794 Long term (current) use of insulin: Secondary | ICD-10-CM | POA: Diagnosis not present

## 2019-10-19 DIAGNOSIS — F329 Major depressive disorder, single episode, unspecified: Secondary | ICD-10-CM | POA: Diagnosis not present

## 2019-10-19 DIAGNOSIS — Z880 Allergy status to penicillin: Secondary | ICD-10-CM | POA: Diagnosis not present

## 2019-11-09 DIAGNOSIS — I679 Cerebrovascular disease, unspecified: Secondary | ICD-10-CM | POA: Diagnosis not present

## 2019-11-20 ENCOUNTER — Other Ambulatory Visit: Payer: Self-pay

## 2019-11-20 MED ORDER — LEVETIRACETAM 500 MG PO TABS: 500.0000 mg | ORAL_TABLET | Freq: Two times a day (BID) | ORAL | 4 refills | Status: AC

## 2019-11-20 NOTE — Telephone Encounter (Signed)
Letter received from [facility] Needing the pt scripts sent to them. Levetriacetam and Duloxetine.    Levetriacetam sent in last script written 09/07/19. Did not send Duloxetine. Not script or note stating pt can continue.

## 2020-01-14 NOTE — Progress Notes (Deleted)
NEUROLOGY FOLLOW UP OFFICE NOTE  Paula Hamilton [id number]  HISTORY OF PRESENT ILLNESS: Paula Hamilton is a 69 year old right-handed white female with CAD, COPD, type 2 diabetes mellitus, hyperlipidemia, and chronic pain syndrome who follows up for recurrent strokes.  UPDATE: Current medications:  Plavix 75mg ; Lipitor 20mg  BID; clonidine; lisinopril; metformin; Lomotil; Keppra 500mg  twice daily  LDL from 09/07/2019 was 69.    HISTORY: [facility] when she had sudden onset confusion, slurred speech and right sided weakness.  Blood pressure at home was 245/110 and blood glucose 341.  She presented to a local ED  where she received IV tPA and [facility].  CT head showed possible stroke.  Echocardiogram and carotid doppler reportedly unremarkable.  She had two other habitual episodes during hospitalization which was concerning for possible seizures.  She was discharged on Keppra 500mg  twice daily.  She followed up with outpatient neurology in July who recommended continuing ASA and Keppra.    [facility] when she developed sudden onset of slurred speech, left facial weakness and left arm and leg numbness and weakness.  No confusion.  Symptoms resolved by the time she got to the hospital.  CT of head showed no acute abnormality, however MRI of brain showed acute right frontal deep white matter infarct.  Plavix was added to her ASA 81mg  regimen with instructions to continue Plavix 75mg  daily alone after 21 days.  Later on Thanksgiving, she was on the commode having a bowel movement and couldn't move her right leg.  She returned to the hospital where she was told that she had another stroke.  No change in management.  She didn't regain complete strength for a couple of days.  Hgb A1c at that time was 8.8%.  She had been noncompliant.  Her PCP, Dr. Leonia ReaderVan Eyk, restarted her on Lipitor 40mg  daily.  LDL in January 2021  was 108.  In early January 2021, [facility] for after another episode of unilateral numbness and weakness.  MRI of brain showed chronic small vessel ischemia but no acute stroke.  Echocardiogram showed EF 60-65% with no ASD/VSD.  Carotid doppler showed 50-69% stenosis of left ICA but given grayscale appearance, estimate likely closer to 50%, as well as less than 50% stenosis of right ICA.  LDL was 93.4 and Hgb A1c was 7.4.  She was discharged on ASA 325mg  and Plavix 75mg  daily and continued on Lipitor 80mg , metformin and basal insulin.   History of chronic back pain s/p several surgeries with history of spinal stimulator.  It causes weakness due to pain.   PAST MEDICAL HISTORY: Past Medical History:  Diagnosis Date  . 3rd cranial nerve palsy, right    just start 03/13/2017  . Anxiety   . Arthritis    listhesis, instability  . Asthma   . Bronchitis    just finished Z pack and is taking cough syrup with Codiene for cough  . CAD (coronary artery disease)   . Chronic pain   . COPD (chronic obstructive pulmonary disease) (HCC)   . Depression   . Diabetes mellitus without complication (HCC)    fasting 270s  . Diabetic retinopathy (HCC)   . Dysphagia   . Fibromyalgia   . GERD (gastroesophageal reflux disease)   . Headache(784.0)    last migraine > 1 yr.   Marland Kitchen. Heart murmur    mentioned yrs. ago, never  mentioned again.   Marland Kitchen History of colon polyps   . Hx of migraines   . Hypercholesterolemia   . Hypertension   . Mental disorder    bipolar disorder  . Neuropathy   . Pneumonia    6 years ago  . PONV (postoperative nausea and vomiting)    has had other surgeries with no problems  . Seizures (HCC) 09/2018  . Shortness of breath   . Stroke [facility]) 09/15/2018    MEDICATIONS: Current Outpatient Medications on File Prior to Visit  Medication Sig Dispense Refill  . acetaminophen (TYLENOL) 500 MG tablet Take 1,000 mg by mouth every 6 (six) hours as needed for mild pain.      Marland Kitchen albuterol (PROVENTIL HFA;VENTOLIN HFA) 108 (90 BASE) MCG/ACT inhaler Inhale 2 puffs into the lungs every 6 (six) hours as needed for wheezing.    Marland Kitchen atorvastatin (LIPITOR) 20 MG tablet Take 20 mg by mouth 2 (two) times daily.    . cloNIDine (CATAPRES) 0.1 MG tablet 0.1 mg. If BP > 160    . clopidogrel (PLAVIX) 75 MG tablet     . diphenhydrAMINE (BENADRYL) 25 mg capsule Take 25 mg by mouth at bedtime as needed.     . diphenoxylate-atropine (LOMOTIL) 2.5-0.025 MG tablet Take 1 tablet by mouth 3 (three) times daily as needed for diarrhea or loose stools. 90 tablet 1  . docusate sodium (COLACE) 100 MG capsule Take 100 mg by mouth daily as needed for mild constipation.     . DULoxetine (CYMBALTA) 60 MG capsule Take 60 mg by mouth 2 (two) times daily.     . Insulin Degludec-Liraglutide (XULTOPHY) 100-3.6 UNIT-MG/ML SOPN Inject 34 Units into the skin daily.     Marland Kitchen levETIRAcetam (KEPPRA) 500 MG tablet Take 1 tablet (500 mg total) by mouth 2 (two) times daily. 180 tablet 4  . lisinopril (PRINIVIL,ZESTRIL) 40 MG tablet 40 mg daily.    . metFORMIN (GLUCOPHAGE-XR) 500 MG 24 hr tablet 1,000 mg 2 (two) times daily.    . metoprolol succinate (TOPROL-XL) 25 MG 24 hr tablet Take 25 mg by mouth daily.    Marland Kitchen NOVOLOG FLEXPEN 100 UNIT/ML FlexPen 6 Units. At meal time    . omeprazole (PRILOSEC) 40 MG capsule Take 40 mg by mouth daily.    . traMADol (ULTRAM) 50 MG tablet Take by mouth every 8 (eight) hours as needed.    . traZODone (DESYREL) 50 MG tablet Take 50 mg by mouth at bedtime.     No current facility-administered medications on file prior to visit.    ALLERGIES: Allergies  Allergen Reactions  . Codeine Rash and Swelling  . Penicillins Rash    PATIENT HAS HAD A PCN REACTION WITH IMMEDIATE RASH, FACIAL/TONGUE/THROAT SWELLING, SOB, OR LIGHTHEADEDNESS WITH HYPOTENSION:  #  #  #  YES  #  #  #   Has patient had a PCN reaction causing severe rash involving mucus membranes or skin necrosis: No Has patient had  a PCN reaction that required hospitalization: No Has patient had a PCN reaction occurring within the last 10 years: No   . Pregabalin Other (See Comments)    Suicidal thoughts, extreme depression  . Erythromycin Base Nausea And Vomiting    STOMACH PAIN  . Morphine And Related Nausea And Vomiting    Has taken in pill form w/o side effects    FAMILY HISTORY: Family History  Problem Relation Age of Onset  . Breast cancer Mother   . Lung cancer Mother   .  Colon cancer Paternal Uncle   . Kidney cancer Paternal Uncle   . Stomach cancer Paternal Aunt   . Stroke Sister        in 2  . Diabetes Sister   . Stroke Brother        in 2  . Diabetes Brother   . Diabetes Brother   . Esophageal cancer Neg Hx     SOCIAL HISTORY: Social History   Socioeconomic History  . Marital status: Widowed    Spouse name: Not on file  . Number of children: 2  . Years of education: Not on file  . Highest education level: Some college, no degree  Occupational History    Comment: retired  Tobacco Use  . Smoking status: Former Smoker    Packs/day: 1.00    Years: 30.00    Pack years: 30.00    Types: Cigarettes    Quit date: 05/22/2011    Years since quitting: 8.6  . Smokeless tobacco: Never Used  Vaping Use  . Vaping Use: Former  Substance and Sexual Activity  . Alcohol use: No  . Drug use: No  . Sexual activity: Not on file  Other Topics Concern  . Not on file  Social History Narrative   Lives with sister, bro-in-law   Caffeine- not much   Social Determinants of Health   Financial Resource Strain:   . Difficulty of Paying Living Expenses:   Food Insecurity:   . Worried About Programme researcher, broadcasting/film/video in the Last Year:   . Barista in the Last Year:   Transportation Needs:   . Freight forwarder (Medical):   Marland Kitchen Lack of Transportation (Non-Medical):   Physical Activity:   . Days of Exercise per Week:   . Minutes of Exercise per Session:   Stress:   . Feeling of Stress :     Social Connections:   . Frequency of Communication with Friends and Family:   . Frequency of Social Gatherings with Friends and Family:   . Attends Religious Services:   . Active Member of Clubs or Organizations:   . Attends Banker Meetings:   Marland Kitchen Marital Status:   Intimate Partner Violence:   . Fear of Current or Ex-Partner:   . Emotionally Abused:   Marland Kitchen Physically Abused:   . Sexually Abused:     PHYSICAL EXAM: *** General: No acute distress.  Patient appears well-groomed.   Head:  Normocephalic/atraumatic Eyes:  Fundi examined but not visualized Neck: supple, no paraspinal tenderness, full range of motion Heart:  Regular rate and rhythm Lungs:  Clear to auscultation bilaterally Back: No paraspinal tenderness Neurological Exam: alert and oriented to person, place, and time. Attention span and concentration intact, recent and remote memory intact, fund of knowledge intact.  Speech fluent and not dysarthric, language intact.  CN II-XII intact. Bulk and tone normal, muscle strength 5/5 throughout.  Sensation to light touch, temperature and vibration intact.  Deep tendon reflexes 2+ throughout, toes downgoing.  Finger to nose and heel to shin testing intact.  Gait normal, Romberg negative.  IMPRESSION: 1.  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  At least one of these events was clearly a CVA, supporting that these events are all cerebrovascular 2.  Hypertension 3.  Hyperlipidemia 4.  Type 2 diabetes mellitus  PLAN: 1.  Plavix for secondary stroke prevention. 2.  Atorvastatin 40mg  daily (LDL goal less than 70) 3.  Blood pressure control 4.  Glycemic control (Hgb  A1c goal less than 7)  Shon Millet, DO  CC: ***

## 2020-01-15 ENCOUNTER — Ambulatory Visit: Payer: PPO | Admitting: Neurology

## 2021-03-24 DIAGNOSIS — E113523 Type 2 diabetes mellitus with proliferative diabetic retinopathy with traction retinal detachment involving the macula, bilateral: Secondary | ICD-10-CM | POA: Diagnosis not present

## 2021-03-25 DIAGNOSIS — E113592 Type 2 diabetes mellitus with proliferative diabetic retinopathy without macular edema, left eye: Secondary | ICD-10-CM | POA: Diagnosis not present

## 2021-03-25 DIAGNOSIS — E113591 Type 2 diabetes mellitus with proliferative diabetic retinopathy without macular edema, right eye: Secondary | ICD-10-CM | POA: Diagnosis not present

## 2021-03-31 DIAGNOSIS — E559 Vitamin D deficiency, unspecified: Secondary | ICD-10-CM | POA: Diagnosis not present

## 2021-03-31 DIAGNOSIS — R5383 Other fatigue: Secondary | ICD-10-CM | POA: Diagnosis not present

## 2021-03-31 DIAGNOSIS — E7800 Pure hypercholesterolemia, unspecified: Secondary | ICD-10-CM | POA: Diagnosis not present

## 2021-03-31 DIAGNOSIS — D519 Vitamin B12 deficiency anemia, unspecified: Secondary | ICD-10-CM | POA: Diagnosis not present

## 2021-03-31 DIAGNOSIS — K219 Gastro-esophageal reflux disease without esophagitis: Secondary | ICD-10-CM | POA: Diagnosis not present

## 2021-03-31 DIAGNOSIS — F17200 Nicotine dependence, unspecified, uncomplicated: Secondary | ICD-10-CM | POA: Diagnosis not present

## 2021-03-31 DIAGNOSIS — E1165 Type 2 diabetes mellitus with hyperglycemia: Secondary | ICD-10-CM | POA: Diagnosis not present

## 2021-03-31 DIAGNOSIS — I10 Essential (primary) hypertension: Secondary | ICD-10-CM | POA: Diagnosis not present

## 2021-03-31 DIAGNOSIS — Z79899 Other long term (current) drug therapy: Secondary | ICD-10-CM | POA: Diagnosis not present

## 2021-04-14 DIAGNOSIS — I10 Essential (primary) hypertension: Secondary | ICD-10-CM | POA: Diagnosis not present

## 2021-04-14 DIAGNOSIS — Z0000 Encounter for general adult medical examination without abnormal findings: Secondary | ICD-10-CM | POA: Diagnosis not present

## 2021-04-14 DIAGNOSIS — E7800 Pure hypercholesterolemia, unspecified: Secondary | ICD-10-CM | POA: Diagnosis not present

## 2021-04-14 DIAGNOSIS — Z6824 Body mass index (BMI) 24.0-24.9, adult: Secondary | ICD-10-CM | POA: Diagnosis not present

## 2021-04-23 DIAGNOSIS — E113593 Type 2 diabetes mellitus with proliferative diabetic retinopathy without macular edema, bilateral: Secondary | ICD-10-CM | POA: Diagnosis not present

## 2021-05-01 DIAGNOSIS — Z1231 Encounter for screening mammogram for malignant neoplasm of breast: Secondary | ICD-10-CM | POA: Diagnosis not present

## 2021-05-06 DIAGNOSIS — J410 Simple chronic bronchitis: Secondary | ICD-10-CM | POA: Diagnosis not present

## 2021-05-06 DIAGNOSIS — J329 Chronic sinusitis, unspecified: Secondary | ICD-10-CM | POA: Diagnosis not present

## 2021-05-06 DIAGNOSIS — J40 Bronchitis, not specified as acute or chronic: Secondary | ICD-10-CM | POA: Diagnosis not present

## 2021-05-21 DIAGNOSIS — E113593 Type 2 diabetes mellitus with proliferative diabetic retinopathy without macular edema, bilateral: Secondary | ICD-10-CM | POA: Diagnosis not present

## 2021-05-21 DIAGNOSIS — E113592 Type 2 diabetes mellitus with proliferative diabetic retinopathy without macular edema, left eye: Secondary | ICD-10-CM | POA: Diagnosis not present

## 2021-05-21 DIAGNOSIS — E113591 Type 2 diabetes mellitus with proliferative diabetic retinopathy without macular edema, right eye: Secondary | ICD-10-CM | POA: Diagnosis not present

## 2021-06-16 DIAGNOSIS — Z91199 Patient's noncompliance with other medical treatment and regimen due to unspecified reason: Secondary | ICD-10-CM | POA: Diagnosis not present

## 2021-06-16 DIAGNOSIS — I10 Essential (primary) hypertension: Secondary | ICD-10-CM | POA: Diagnosis not present

## 2021-06-16 DIAGNOSIS — E1129 Type 2 diabetes mellitus with other diabetic kidney complication: Secondary | ICD-10-CM | POA: Diagnosis not present

## 2021-06-16 DIAGNOSIS — E7800 Pure hypercholesterolemia, unspecified: Secondary | ICD-10-CM | POA: Diagnosis not present

## 2021-06-18 DIAGNOSIS — E113591 Type 2 diabetes mellitus with proliferative diabetic retinopathy without macular edema, right eye: Secondary | ICD-10-CM | POA: Diagnosis not present

## 2021-07-06 DIAGNOSIS — J410 Simple chronic bronchitis: Secondary | ICD-10-CM | POA: Diagnosis not present

## 2021-07-16 DIAGNOSIS — E113592 Type 2 diabetes mellitus with proliferative diabetic retinopathy without macular edema, left eye: Secondary | ICD-10-CM | POA: Diagnosis not present

## 2021-08-03 DIAGNOSIS — E559 Vitamin D deficiency, unspecified: Secondary | ICD-10-CM | POA: Diagnosis not present

## 2021-08-03 DIAGNOSIS — E1129 Type 2 diabetes mellitus with other diabetic kidney complication: Secondary | ICD-10-CM | POA: Diagnosis not present

## 2021-08-04 DIAGNOSIS — I10 Essential (primary) hypertension: Secondary | ICD-10-CM | POA: Diagnosis not present

## 2021-08-04 DIAGNOSIS — E782 Mixed hyperlipidemia: Secondary | ICD-10-CM | POA: Diagnosis not present

## 2021-08-06 DIAGNOSIS — J410 Simple chronic bronchitis: Secondary | ICD-10-CM | POA: Diagnosis not present

## 2021-08-20 ENCOUNTER — Encounter: Payer: Self-pay | Admitting: *Deleted

## 2021-08-24 ENCOUNTER — Encounter: Payer: Self-pay | Admitting: Diagnostic Neuroimaging

## 2021-08-24 ENCOUNTER — Ambulatory Visit: Payer: Medicare Other | Admitting: Diagnostic Neuroimaging

## 2021-08-24 ENCOUNTER — Other Ambulatory Visit: Payer: Self-pay

## 2021-08-24 VITALS — BP 157/70 | HR 71 | Ht 64.0 in | Wt 151.4 lb

## 2021-08-24 DIAGNOSIS — G40909 Epilepsy, unspecified, not intractable, without status epilepticus: Secondary | ICD-10-CM

## 2021-08-24 NOTE — Progress Notes (Signed)
[facility]  PATIENT: Paula Hamilton DOB: [birth year]  REFERRING CLINICIAN: Noni Saupe, MD HISTORY FROM: patient  REASON FOR VISIT: new consult    HISTORICAL  CHIEF COMPLAINT:  Chief Complaint  Patient presents with   New Patient (Initial Visit)    Room 6. Pt referred by Gwendlyn Deutscher, MD for seizures. Here with husband. She believes she had a seizure before Christmas, but no events since.     HISTORY OF PRESENT ILLNESS:   UPDATE (08/24/21, VRP): Since last visit, doing well until Dec 2022, had episode at [facility]. Shoplifters had triggered the alarms. Then she got suddenly confused, but back to baseline in a few minutes. No convulsions. No LOC.   PRIOR HPI (01/30/19): 71 year old female with hyperlipidemia, diabetes, chronic pain syndrome, here for evaluation of stroke versus seizure.  09/15/2018 patient was visiting [facility] when all of a sudden she had slurred speech and right-sided weakness.  She had confusion.  Blood pressure at home was 245/110 and blood glucose was 341.  Patient went to the hospital and was given IV TPA.  She was transferred to another hospital to neuro ICU.  Patient had 2 more attacks while in the hospital and therefore possibility of seizure was raised.  Patient was discharged on levetiracetam 500 mg twice a day.  Stroke work-up was completed.  Since that time no further events.  Patient is tolerating medication without difficulty.  She is not driving.  There is family history of seizure in her mother and nephew.  Other family members have history of stroke.   REVIEW OF SYSTEMS: Full 14 system review of systems performed and negative with exception of: As per HPI.  ALLERGIES: Allergies  Allergen Reactions   Codeine Rash and Swelling   Penicillins Rash    PATIENT HAS HAD A PCN REACTION WITH IMMEDIATE RASH, FACIAL/TONGUE/THROAT SWELLING, SOB, OR LIGHTHEADEDNESS WITH HYPOTENSION:  #  #  #  YES  #  #  #   Has patient had a  PCN reaction causing severe rash involving mucus membranes or skin necrosis: No Has patient had a PCN reaction that required hospitalization: No Has patient had a PCN reaction occurring within the last 10 years: No    Pregabalin Other (See Comments)    Suicidal thoughts, extreme depression   Atorvastatin    Doxycycline Swelling   Metformin Other (See Comments)    Profound muscle weakness whren mixed w/actos or januvia   Erythromycin Base Nausea And Vomiting    STOMACH PAIN   Morphine And Related Nausea And Vomiting    Has taken in pill form w/o side effects    HOME MEDICATIONS: Outpatient Medications Prior to Visit  Medication Sig Dispense Refill   acetaminophen (TYLENOL) 500 MG tablet Take 1,000 mg by mouth every 6 (six) hours as needed for mild pain.     albuterol (PROVENTIL HFA;VENTOLIN HFA) 108 (90 BASE) MCG/ACT inhaler Inhale 2 puffs into the lungs every 6 (six) hours as needed for wheezing.     clopidogrel (PLAVIX) 75 MG tablet Take 75 mg by mouth daily.     diphenhydrAMINE (BENADRYL) 25 mg capsule Take 25 mg by mouth at bedtime as needed.      diphenoxylate-atropine (LOMOTIL) 2.5-0.025 MG tablet Take 1 tablet by mouth 3 (three) times daily as needed for diarrhea or loose stools. 90 tablet 1   Insulin Degludec-Liraglutide (XULTOPHY) 100-3.6 UNIT-MG/ML SOPN Inject 30 Units into the skin daily.     levETIRAcetam (KEPPRA) 500 MG  tablet Take 1 tablet (500 mg total) by mouth 2 (two) times daily. 180 tablet 4   lisinopril (ZESTRIL) 20 MG tablet Take 20 mg by mouth daily.     rosuvastatin (CRESTOR) 10 MG tablet Take 1 tablet by mouth at bedtime.     Vitamin D, Ergocalciferol, (DRISDOL) 1.25 MG (50000 UNIT) CAPS capsule Take 50,000 Units by mouth 2 (two) times a week.     atorvastatin (LIPITOR) 20 MG tablet Take 20 mg by mouth 2 (two) times daily.     cloNIDine (CATAPRES) 0.1 MG tablet 0.1 mg. If BP > 160     docusate sodium (COLACE) 100 MG capsule Take 100 mg by mouth daily as needed  for mild constipation.      DULoxetine (CYMBALTA) 60 MG capsule Take 60 mg by mouth 2 (two) times daily.      lisinopril (PRINIVIL,ZESTRIL) 40 MG tablet 40 mg daily.     metFORMIN (GLUCOPHAGE-XR) 500 MG 24 hr tablet 1,000 mg 2 (two) times daily.     metoprolol succinate (TOPROL-XL) 25 MG 24 hr tablet Take 25 mg by mouth daily.     NOVOLOG FLEXPEN 100 UNIT/ML FlexPen 6 Units. At meal time     omeprazole (PRILOSEC) 40 MG capsule Take 40 mg by mouth daily.     traMADol (ULTRAM) 50 MG tablet Take by mouth every 8 (eight) hours as needed.     traZODone (DESYREL) 50 MG tablet Take 50 mg by mouth at bedtime.     No facility-administered medications prior to visit.    PAST MEDICAL HISTORY: Past Medical History:  Diagnosis Date   3rd cranial nerve palsy, right    just start 03/13/2017   Anxiety    Arthritis    listhesis, instability   Asthma    Bronchitis    just finished Z pack and is taking cough syrup with Codiene for cough   CAD (coronary artery disease)    Chronic pain    COPD (chronic obstructive pulmonary disease) (HCC)    Depression    Diabetes mellitus without complication (HCC)    fasting 270s   Diabetic retinopathy (HCC)    Dysphagia    Fibromyalgia    GERD (gastroesophageal reflux disease)    Headache(784.0)    last migraine > 1 yr.    Heart murmur    mentioned yrs. ago, never mentioned again.    History of colon polyps    Hx of migraines    Hypercholesterolemia    Hypertension    Mental disorder    bipolar disorder   Neuropathy    Pneumonia    6 years ago   PONV (postoperative nausea and vomiting)    has had other surgeries with no problems   Seizures (HCC) 09/2018   Shortness of breath    Stroke (HCC) 09/15/2018   multiple    PAST SURGICAL HISTORY: Past Surgical History:  Procedure Laterality Date   ABDOMINAL HYSTERECTOMY     APPENDECTOMY     age 3   BACK SURGERY     lower back 4 time, tens surgery too   CERVICAL FUSION     COLONOSCOPY W/ BIOPSIES  AND POLYPECTOMY  11/21/2014   Colonic polyp status post polypectomy. benign. Mild sigmoid diverticulosis.   ESOPHAGOGASTRODUODENOSCOPY  02/26/2016   Moderate gastritis. Otherwise normal EGD    ESOPHAGOGASTRODUODENOSCOPY  08/24/20117   Mioderate gastritis. OTherwise normal EGD.    EYE SURGERY     laser surgery on left eye    MAXIMUM  ACCESS (MAS)POSTERIOR LUMBAR INTERBODY FUSION (PLIF) 1 LEVEL N/A 04/12/2013   Procedure: Lumba four-five maximum access surgery Posterior Lumbar Interbody Fusion;  Surgeon: Tia Alertavid S Jones, MD;  Location: [facility];  Service: Neurosurgery;  Laterality: N/A;  Lumba four-five maximum access surgery Posterior Lumbar Interbody Fusion   SPINAL CORD STIMULATOR INSERTION N/A 06/14/2014   Procedure: LUMBAR SPINAL CORD STIMULATOR INSERTION;  Surgeon: Gwynne EdingerPaul C Harkins, MD;  Location: [facility];  Service: Neurosurgery;  Laterality: N/A;   SPINAL CORD STIMULATOR REMOVAL N/A 04/12/2017   Procedure: LUMBAR SPINAL CORD STIMULATOR REMOVAL;  Surgeon: Odette FractionHarkins, Paul, MD;  [facility];  Service: Neurosurgery;  Laterality: N/A;   TUBAL LIGATION  1984    FAMILY HISTORY: Family History  Problem Relation Age of Onset   Breast cancer Mother    Lung cancer Mother    COPD Mother    Melanoma Father    Stroke Sister        in 2   Diabetes Sister    Stroke Brother        in 2   Diabetes Brother    Diabetes Brother    Stomach cancer Paternal Aunt    Colon cancer Paternal Uncle    Kidney cancer Paternal Uncle    Esophageal cancer Neg Hx     SOCIAL HISTORY: Social History   Socioeconomic History   Marital status: Widowed    Spouse name: Not on file   Number of children: 2   Years of education: Not on file   Highest education level: Some college, no degree  Occupational History    Comment: retired  Tobacco Use   Smoking status: Former    Packs/day: 1.00    Years: 30.00    Pack years: 30.00    Types: Cigarettes    Quit date: 05/22/2011    Years since quitting:  10.2   Smokeless tobacco: Never  Vaping Use   Vaping Use: Former  Substance and Sexual Activity   Alcohol use: No   Drug use: No   Sexual activity: Not on file  Other Topics Concern   Not on file  Social History Narrative   Lives with sister, bro-in-law   Caffeine- not much   Social Determinants of Health   Financial Resource Strain: Not on file  Food Insecurity: Not on file  Transportation Needs: Not on file  Physical Activity: Not on file  Stress: Not on file  Social Connections: Not on file  Intimate Partner Violence: Not on file     PHYSICAL EXAM  GENERAL EXAM/CONSTITUTIONAL: Vitals:  Vitals:   08/24/21 1125  BP: (!) 157/70  Pulse: 71  Weight: 151 lb 6.4 oz (68.7 kg)  Height: 5\' 4"  (1.626 m)   Body mass index is 25.99 kg/m. Wt Readings from Last 3 Encounters:  08/24/21 151 lb 6.4 oz (68.7 kg)  09/07/19 145 lb (65.8 kg)  01/30/19 152 lb 9.6 oz (69.2 kg)   Patient is in no distress; well developed, nourished and groomed; neck is supple  CARDIOVASCULAR: Examination of carotid arteries is normal; no carotid bruits Regular rate and rhythm, no murmurs Examination of peripheral vascular system by observation and palpation is normal  EYES: Ophthalmoscopic exam of optic discs and posterior segments is normal; no papilledema or hemorrhages No results found.  MUSCULOSKELETAL: Gait, strength, tone, movements noted in Neurologic exam below  NEUROLOGIC: MENTAL STATUS:  No flowsheet data found. awake, alert, oriented to person, place and time recent and remote memory intact normal attention  and concentration language fluent, comprehension intact, naming intact fund of knowledge appropriate  CRANIAL NERVE:  2nd - no papilledema on fundoscopic exam 2nd, 3rd, 4th, 6th - pupils equal and reactive to light, visual fields full to confrontation, extraocular muscles intact, no nystagmus 5th - facial sensation symmetric 7th - facial strength symmetric 8th - hearing  intact 9th - palate elevates symmetrically, uvula midline 11th - shoulder shrug symmetric 12th - tongue protrusion midline HOARSE VOICE  MOTOR:  normal bulk and tone, full strength in the BUE, BLE  SENSORY:  normal and symmetric to light touch, temperature, vibration; EXCEPT DECR IN FEET  COORDINATION:  finger-nose-finger, fine finger movements normal  REFLEXES:  deep tendon reflexes TRACE and symmetric  GAIT/STATION:  ANTALGIC GAIT     DIAGNOSTIC DATA (LABS, IMAGING, TESTING) - I reviewed patient records, labs, notes, testing and imaging myself where available.  Lab Results  Component Value Date   WBC 14.4 (H) 07/11/2018   HGB 14.1 07/11/2018   HCT 42.3 07/11/2018   MCV 95.6 07/11/2018   PLT 460.0 (H) 07/11/2018      Component Value Date/Time   NA 139 07/11/2018 1450   K 5.8 (H) 07/11/2018 1450   CL 107 07/11/2018 1450   CO2 23 07/11/2018 1450   GLUCOSE 214 (H) 07/11/2018 1450   BUN 25 (H) 07/11/2018 1450   CREATININE 1.33 (H) 07/11/2018 1450   CALCIUM 9.7 07/11/2018 1450   PROT 6.3 07/11/2018 1450   ALBUMIN 3.8 07/11/2018 1450   AST 10 07/11/2018 1450   ALT 11 07/11/2018 1450   ALKPHOS 117 07/11/2018 1450   BILITOT 0.3 07/11/2018 1450   GFRNONAA >60 04/11/2017 0853   GFRAA >60 04/11/2017 0853   Lab Results  Component Value Date   CHOL 128 09/07/2019   HDL 35 (L) 09/07/2019   LDLCALC 69 09/07/2019   TRIG 158 (H) 09/07/2019   CHOLHDL 3.7 09/07/2019   Lab Results  Component Value Date   HGBA1C 9.7 (H) 07/11/2018   No results found for: VITAMINB12 Lab Results  Component Value Date   TSH 1.50 07/11/2018    09/17/18 CT head - no acute finding  09/15/18 CTA head / neck -No large vessel occlusion; no aneurysmal dilation -Right ICA 55% origin stenosis -Left ICA no stenosis  09/16/18 MRI brain -No acute infarct or other intracranial finding   ASSESSMENT AND PLAN  70 y.o. year old female here with 3 episodes of slurred speech and right-sided  facial weakness, may have represented TIA versus partial seizure.  Stroke work-up was completed.  Patient is tolerating levetiracetam 500 mg twice a day.   Ddx: TIA vs seizure  1. Seizure disorder [facility])       PLAN:  ABNORMAL CONFUSION SPELL (Dec 2022) - unclear etiology; could have been complex partial seizure vs other spell related to stress from shoplifting alarm event at [facility] - now back to baseline  SEIZURE PREVENTION - continue levetiracetam 500mg  twice a day   STROKE PREVENTION - continue aspirin, DM control, statin, BP control  Return for pending if symptoms worsen or fail to improve.      , MD 08/24/2021, 12:08 PM Certified in Neurology, Neurophysiology and Neuroimaging  [facility] [address] [place], [place] [phone]

## 2021-08-24 NOTE — Patient Instructions (Signed)
°  ABNORMAL CONFUSION SPELL (Dec 2022) - unclear etiology; could have been complex partial seizure vs other spell related to stress from shoplifting alarm event at [facility] - now back to baseline  SEIZURE PREVENTION - continue levetiracetam 500mg  twice a day   STROKE PREVENTION - continue aspirin, DM control, statin, BP control

## 2021-09-01 DIAGNOSIS — E782 Mixed hyperlipidemia: Secondary | ICD-10-CM | POA: Diagnosis not present

## 2021-09-01 DIAGNOSIS — I10 Essential (primary) hypertension: Secondary | ICD-10-CM | POA: Diagnosis not present

## 2021-09-03 DIAGNOSIS — J410 Simple chronic bronchitis: Secondary | ICD-10-CM | POA: Diagnosis not present

## 2021-09-04 DIAGNOSIS — H25813 Combined forms of age-related cataract, bilateral: Secondary | ICD-10-CM | POA: Diagnosis not present

## 2021-09-04 DIAGNOSIS — H25812 Combined forms of age-related cataract, left eye: Secondary | ICD-10-CM | POA: Diagnosis not present

## 2021-09-04 DIAGNOSIS — Z01818 Encounter for other preprocedural examination: Secondary | ICD-10-CM | POA: Diagnosis not present

## 2021-09-04 DIAGNOSIS — E119 Type 2 diabetes mellitus without complications: Secondary | ICD-10-CM | POA: Diagnosis not present

## 2021-09-10 DIAGNOSIS — E113591 Type 2 diabetes mellitus with proliferative diabetic retinopathy without macular edema, right eye: Secondary | ICD-10-CM | POA: Diagnosis not present

## 2021-09-14 DIAGNOSIS — I10 Essential (primary) hypertension: Secondary | ICD-10-CM | POA: Diagnosis not present

## 2021-09-14 DIAGNOSIS — E7800 Pure hypercholesterolemia, unspecified: Secondary | ICD-10-CM | POA: Diagnosis not present

## 2021-09-14 DIAGNOSIS — Z23 Encounter for immunization: Secondary | ICD-10-CM | POA: Diagnosis not present

## 2021-09-14 DIAGNOSIS — Z91199 Patient's noncompliance with other medical treatment and regimen due to unspecified reason: Secondary | ICD-10-CM | POA: Diagnosis not present

## 2021-09-14 DIAGNOSIS — E1165 Type 2 diabetes mellitus with hyperglycemia: Secondary | ICD-10-CM | POA: Diagnosis not present

## 2021-09-14 DIAGNOSIS — Z794 Long term (current) use of insulin: Secondary | ICD-10-CM | POA: Diagnosis not present

## 2021-09-15 DIAGNOSIS — H4311 Vitreous hemorrhage, right eye: Secondary | ICD-10-CM | POA: Diagnosis not present

## 2021-09-15 DIAGNOSIS — H25812 Combined forms of age-related cataract, left eye: Secondary | ICD-10-CM | POA: Diagnosis not present

## 2021-09-15 DIAGNOSIS — E113592 Type 2 diabetes mellitus with proliferative diabetic retinopathy without macular edema, left eye: Secondary | ICD-10-CM | POA: Diagnosis not present

## 2021-09-15 DIAGNOSIS — Z7902 Long term (current) use of antithrombotics/antiplatelets: Secondary | ICD-10-CM | POA: Diagnosis not present

## 2021-09-15 DIAGNOSIS — H259 Unspecified age-related cataract: Secondary | ICD-10-CM | POA: Diagnosis not present

## 2021-09-15 DIAGNOSIS — Z8673 Personal history of transient ischemic attack (TIA), and cerebral infarction without residual deficits: Secondary | ICD-10-CM | POA: Diagnosis not present

## 2021-09-15 DIAGNOSIS — Z794 Long term (current) use of insulin: Secondary | ICD-10-CM | POA: Diagnosis not present

## 2021-10-04 DIAGNOSIS — J410 Simple chronic bronchitis: Secondary | ICD-10-CM | POA: Diagnosis not present

## 2021-10-05 DIAGNOSIS — R051 Acute cough: Secondary | ICD-10-CM | POA: Diagnosis not present

## 2021-10-05 DIAGNOSIS — J069 Acute upper respiratory infection, unspecified: Secondary | ICD-10-CM | POA: Diagnosis not present

## 2021-10-12 DIAGNOSIS — G40909 Epilepsy, unspecified, not intractable, without status epilepticus: Secondary | ICD-10-CM | POA: Insufficient documentation

## 2021-10-12 DIAGNOSIS — R569 Unspecified convulsions: Secondary | ICD-10-CM | POA: Insufficient documentation

## 2021-10-12 DIAGNOSIS — Z8673 Personal history of transient ischemic attack (TIA), and cerebral infarction without residual deficits: Secondary | ICD-10-CM | POA: Insufficient documentation

## 2021-10-15 DIAGNOSIS — E113591 Type 2 diabetes mellitus with proliferative diabetic retinopathy without macular edema, right eye: Secondary | ICD-10-CM | POA: Diagnosis not present

## 2021-10-27 DIAGNOSIS — H25811 Combined forms of age-related cataract, right eye: Secondary | ICD-10-CM | POA: Diagnosis not present

## 2021-10-27 DIAGNOSIS — H4901 Third [oculomotor] nerve palsy, right eye: Secondary | ICD-10-CM | POA: Diagnosis not present

## 2021-10-27 DIAGNOSIS — F17210 Nicotine dependence, cigarettes, uncomplicated: Secondary | ICD-10-CM | POA: Diagnosis not present

## 2021-10-27 DIAGNOSIS — H4311 Vitreous hemorrhage, right eye: Secondary | ICD-10-CM | POA: Diagnosis not present

## 2021-10-27 DIAGNOSIS — Z794 Long term (current) use of insulin: Secondary | ICD-10-CM | POA: Diagnosis not present

## 2021-10-27 DIAGNOSIS — E1136 Type 2 diabetes mellitus with diabetic cataract: Secondary | ICD-10-CM | POA: Diagnosis not present

## 2021-10-27 DIAGNOSIS — I10 Essential (primary) hypertension: Secondary | ICD-10-CM | POA: Diagnosis not present

## 2021-10-27 DIAGNOSIS — H259 Unspecified age-related cataract: Secondary | ICD-10-CM | POA: Diagnosis not present

## 2021-10-27 DIAGNOSIS — E113593 Type 2 diabetes mellitus with proliferative diabetic retinopathy without macular edema, bilateral: Secondary | ICD-10-CM | POA: Diagnosis not present

## 2021-11-01 DIAGNOSIS — E7800 Pure hypercholesterolemia, unspecified: Secondary | ICD-10-CM | POA: Diagnosis not present

## 2021-11-01 DIAGNOSIS — E1129 Type 2 diabetes mellitus with other diabetic kidney complication: Secondary | ICD-10-CM | POA: Diagnosis not present

## 2021-11-01 DIAGNOSIS — I10 Essential (primary) hypertension: Secondary | ICD-10-CM | POA: Diagnosis not present

## 2021-11-03 DIAGNOSIS — J410 Simple chronic bronchitis: Secondary | ICD-10-CM | POA: Diagnosis not present

## 2021-11-12 DIAGNOSIS — H4311 Vitreous hemorrhage, right eye: Secondary | ICD-10-CM | POA: Diagnosis not present

## 2021-11-12 DIAGNOSIS — E113591 Type 2 diabetes mellitus with proliferative diabetic retinopathy without macular edema, right eye: Secondary | ICD-10-CM | POA: Diagnosis not present

## 2021-12-04 DIAGNOSIS — J410 Simple chronic bronchitis: Secondary | ICD-10-CM | POA: Diagnosis not present

## 2021-12-10 DIAGNOSIS — E113591 Type 2 diabetes mellitus with proliferative diabetic retinopathy without macular edema, right eye: Secondary | ICD-10-CM | POA: Diagnosis not present

## 2021-12-21 DIAGNOSIS — Z794 Long term (current) use of insulin: Secondary | ICD-10-CM | POA: Diagnosis not present

## 2022-01-03 DIAGNOSIS — J410 Simple chronic bronchitis: Secondary | ICD-10-CM | POA: Diagnosis not present

## 2022-01-04 DIAGNOSIS — M791 Myalgia, unspecified site: Secondary | ICD-10-CM | POA: Diagnosis not present

## 2022-01-04 DIAGNOSIS — E1129 Type 2 diabetes mellitus with other diabetic kidney complication: Secondary | ICD-10-CM | POA: Diagnosis not present

## 2022-01-07 ENCOUNTER — Telehealth: Payer: Self-pay | Admitting: Diagnostic Neuroimaging

## 2022-01-07 NOTE — Telephone Encounter (Signed)
Returned call to [facility] family physicians, spoke with Toni Amend, clinical pharmacist at PCP practice who stated patient wasn't sure if she was supposed to keep taking levetiracetam. She reported some fatigue and depression. We reviewed Dr Richrd Humbles last note and I advised if patient feels her current issues are from medication she can schedule a follow up. She may want to discuss with PCP as well as there could be other causes. I advised she is diagnosed with epilepsy so she must continue taking levetiracetam. Toni Amend will tell patient to call and schedule FU if she feels she needs to be seen for possible med change, will advise her to continue taking medication. Paula Hamilton verbalized understanding, appreciation. Her PCP is now Dr Gwendlyn Deutscher, EMR changed.

## 2022-01-07 NOTE — Telephone Encounter (Signed)
[facility]) want to know how long pt going to be on levETIRAcetam (KEPPRA) 500 MG tablet . Would like a call from the nurse to discuss.

## 2022-02-01 DIAGNOSIS — I10 Essential (primary) hypertension: Secondary | ICD-10-CM | POA: Diagnosis not present

## 2022-02-01 DIAGNOSIS — E7800 Pure hypercholesterolemia, unspecified: Secondary | ICD-10-CM | POA: Diagnosis not present

## 2022-02-01 DIAGNOSIS — E1129 Type 2 diabetes mellitus with other diabetic kidney complication: Secondary | ICD-10-CM | POA: Diagnosis not present

## 2022-02-03 DIAGNOSIS — J410 Simple chronic bronchitis: Secondary | ICD-10-CM | POA: Diagnosis not present

## 2022-02-05 DIAGNOSIS — E1129 Type 2 diabetes mellitus with other diabetic kidney complication: Secondary | ICD-10-CM | POA: Diagnosis not present

## 2022-03-04 DIAGNOSIS — E7800 Pure hypercholesterolemia, unspecified: Secondary | ICD-10-CM | POA: Diagnosis not present

## 2022-03-04 DIAGNOSIS — E1129 Type 2 diabetes mellitus with other diabetic kidney complication: Secondary | ICD-10-CM | POA: Diagnosis not present

## 2022-03-04 DIAGNOSIS — I10 Essential (primary) hypertension: Secondary | ICD-10-CM | POA: Diagnosis not present

## 2022-03-06 DIAGNOSIS — J410 Simple chronic bronchitis: Secondary | ICD-10-CM | POA: Diagnosis not present

## 2022-03-19 DIAGNOSIS — R5383 Other fatigue: Secondary | ICD-10-CM | POA: Diagnosis not present

## 2022-03-19 DIAGNOSIS — E1165 Type 2 diabetes mellitus with hyperglycemia: Secondary | ICD-10-CM | POA: Diagnosis not present

## 2022-03-19 DIAGNOSIS — K219 Gastro-esophageal reflux disease without esophagitis: Secondary | ICD-10-CM | POA: Diagnosis not present

## 2022-03-19 DIAGNOSIS — N189 Chronic kidney disease, unspecified: Secondary | ICD-10-CM | POA: Diagnosis not present

## 2022-03-19 DIAGNOSIS — R195 Other fecal abnormalities: Secondary | ICD-10-CM | POA: Diagnosis not present

## 2022-04-03 DIAGNOSIS — E7800 Pure hypercholesterolemia, unspecified: Secondary | ICD-10-CM | POA: Diagnosis not present

## 2022-04-03 DIAGNOSIS — I10 Essential (primary) hypertension: Secondary | ICD-10-CM | POA: Diagnosis not present

## 2022-04-03 DIAGNOSIS — E1129 Type 2 diabetes mellitus with other diabetic kidney complication: Secondary | ICD-10-CM | POA: Diagnosis not present

## 2022-04-05 DIAGNOSIS — J410 Simple chronic bronchitis: Secondary | ICD-10-CM | POA: Diagnosis not present

## 2022-04-15 DIAGNOSIS — I491 Atrial premature depolarization: Secondary | ICD-10-CM | POA: Diagnosis not present

## 2022-04-15 DIAGNOSIS — I2510 Atherosclerotic heart disease of native coronary artery without angina pectoris: Secondary | ICD-10-CM | POA: Diagnosis not present

## 2022-04-15 DIAGNOSIS — E119 Type 2 diabetes mellitus without complications: Secondary | ICD-10-CM | POA: Diagnosis not present

## 2022-04-15 DIAGNOSIS — R42 Dizziness and giddiness: Secondary | ICD-10-CM | POA: Diagnosis not present

## 2022-04-15 DIAGNOSIS — H8110 Benign paroxysmal vertigo, unspecified ear: Secondary | ICD-10-CM | POA: Diagnosis not present

## 2022-05-06 DIAGNOSIS — J410 Simple chronic bronchitis: Secondary | ICD-10-CM | POA: Diagnosis not present

## 2022-06-03 DIAGNOSIS — I10 Essential (primary) hypertension: Secondary | ICD-10-CM | POA: Diagnosis not present

## 2022-06-03 DIAGNOSIS — E7800 Pure hypercholesterolemia, unspecified: Secondary | ICD-10-CM | POA: Diagnosis not present

## 2022-06-03 DIAGNOSIS — E1129 Type 2 diabetes mellitus with other diabetic kidney complication: Secondary | ICD-10-CM | POA: Diagnosis not present

## 2022-06-18 DIAGNOSIS — I10 Essential (primary) hypertension: Secondary | ICD-10-CM | POA: Diagnosis not present

## 2022-06-18 DIAGNOSIS — Z23 Encounter for immunization: Secondary | ICD-10-CM | POA: Diagnosis not present

## 2022-06-18 DIAGNOSIS — E7800 Pure hypercholesterolemia, unspecified: Secondary | ICD-10-CM | POA: Diagnosis not present

## 2022-06-18 DIAGNOSIS — Z794 Long term (current) use of insulin: Secondary | ICD-10-CM | POA: Diagnosis not present

## 2022-06-18 DIAGNOSIS — G8929 Other chronic pain: Secondary | ICD-10-CM | POA: Diagnosis not present

## 2022-06-18 DIAGNOSIS — E1165 Type 2 diabetes mellitus with hyperglycemia: Secondary | ICD-10-CM | POA: Diagnosis not present

## 2022-06-18 DIAGNOSIS — M549 Dorsalgia, unspecified: Secondary | ICD-10-CM | POA: Diagnosis not present

## 2022-07-08 DIAGNOSIS — E113511 Type 2 diabetes mellitus with proliferative diabetic retinopathy with macular edema, right eye: Secondary | ICD-10-CM | POA: Diagnosis not present

## 2022-07-08 DIAGNOSIS — H4311 Vitreous hemorrhage, right eye: Secondary | ICD-10-CM | POA: Diagnosis not present

## 2022-07-08 DIAGNOSIS — E113592 Type 2 diabetes mellitus with proliferative diabetic retinopathy without macular edema, left eye: Secondary | ICD-10-CM | POA: Diagnosis not present

## 2022-09-02 DIAGNOSIS — E1129 Type 2 diabetes mellitus with other diabetic kidney complication: Secondary | ICD-10-CM | POA: Diagnosis not present

## 2022-09-02 DIAGNOSIS — E7800 Pure hypercholesterolemia, unspecified: Secondary | ICD-10-CM | POA: Diagnosis not present

## 2022-09-14 DIAGNOSIS — I10 Essential (primary) hypertension: Secondary | ICD-10-CM | POA: Diagnosis not present

## 2022-09-14 DIAGNOSIS — E785 Hyperlipidemia, unspecified: Secondary | ICD-10-CM | POA: Diagnosis not present

## 2022-09-14 DIAGNOSIS — I639 Cerebral infarction, unspecified: Secondary | ICD-10-CM | POA: Diagnosis not present

## 2022-09-14 DIAGNOSIS — J45909 Unspecified asthma, uncomplicated: Secondary | ICD-10-CM | POA: Diagnosis not present

## 2022-09-14 DIAGNOSIS — R569 Unspecified convulsions: Secondary | ICD-10-CM | POA: Diagnosis not present

## 2022-09-14 DIAGNOSIS — M159 Polyosteoarthritis, unspecified: Secondary | ICD-10-CM | POA: Diagnosis not present

## 2022-09-14 DIAGNOSIS — R5383 Other fatigue: Secondary | ICD-10-CM | POA: Diagnosis not present

## 2022-09-14 DIAGNOSIS — E1169 Type 2 diabetes mellitus with other specified complication: Secondary | ICD-10-CM | POA: Diagnosis not present

## 2022-09-14 DIAGNOSIS — E7800 Pure hypercholesterolemia, unspecified: Secondary | ICD-10-CM | POA: Diagnosis not present

## 2022-09-28 DIAGNOSIS — I639 Cerebral infarction, unspecified: Secondary | ICD-10-CM | POA: Diagnosis not present

## 2022-09-28 DIAGNOSIS — E785 Hyperlipidemia, unspecified: Secondary | ICD-10-CM | POA: Diagnosis not present

## 2022-09-28 DIAGNOSIS — N1831 Chronic kidney disease, stage 3a: Secondary | ICD-10-CM | POA: Diagnosis not present

## 2022-09-28 DIAGNOSIS — R569 Unspecified convulsions: Secondary | ICD-10-CM | POA: Diagnosis not present

## 2022-09-28 DIAGNOSIS — M159 Polyosteoarthritis, unspecified: Secondary | ICD-10-CM | POA: Diagnosis not present

## 2022-09-28 DIAGNOSIS — E7800 Pure hypercholesterolemia, unspecified: Secondary | ICD-10-CM | POA: Diagnosis not present

## 2022-09-28 DIAGNOSIS — Z20822 Contact with and (suspected) exposure to covid-19: Secondary | ICD-10-CM | POA: Diagnosis not present

## 2022-09-28 DIAGNOSIS — R6889 Other general symptoms and signs: Secondary | ICD-10-CM | POA: Diagnosis not present

## 2022-09-28 DIAGNOSIS — I10 Essential (primary) hypertension: Secondary | ICD-10-CM | POA: Diagnosis not present

## 2022-09-28 DIAGNOSIS — E1169 Type 2 diabetes mellitus with other specified complication: Secondary | ICD-10-CM | POA: Diagnosis not present

## 2022-09-28 DIAGNOSIS — J45909 Unspecified asthma, uncomplicated: Secondary | ICD-10-CM | POA: Diagnosis not present

## 2022-10-12 DIAGNOSIS — J45909 Unspecified asthma, uncomplicated: Secondary | ICD-10-CM | POA: Diagnosis not present

## 2022-10-12 DIAGNOSIS — I10 Essential (primary) hypertension: Secondary | ICD-10-CM | POA: Diagnosis not present

## 2022-10-12 DIAGNOSIS — E785 Hyperlipidemia, unspecified: Secondary | ICD-10-CM | POA: Diagnosis not present

## 2022-10-12 DIAGNOSIS — M159 Polyosteoarthritis, unspecified: Secondary | ICD-10-CM | POA: Diagnosis not present

## 2022-10-12 DIAGNOSIS — I639 Cerebral infarction, unspecified: Secondary | ICD-10-CM | POA: Diagnosis not present

## 2022-10-12 DIAGNOSIS — N1831 Chronic kidney disease, stage 3a: Secondary | ICD-10-CM | POA: Diagnosis not present

## 2022-10-12 DIAGNOSIS — E7800 Pure hypercholesterolemia, unspecified: Secondary | ICD-10-CM | POA: Diagnosis not present

## 2022-10-12 DIAGNOSIS — R569 Unspecified convulsions: Secondary | ICD-10-CM | POA: Diagnosis not present

## 2022-10-12 DIAGNOSIS — E1169 Type 2 diabetes mellitus with other specified complication: Secondary | ICD-10-CM | POA: Diagnosis not present

## 2022-11-02 DIAGNOSIS — J45909 Unspecified asthma, uncomplicated: Secondary | ICD-10-CM | POA: Diagnosis not present

## 2022-11-02 DIAGNOSIS — E1169 Type 2 diabetes mellitus with other specified complication: Secondary | ICD-10-CM | POA: Diagnosis not present

## 2022-11-02 DIAGNOSIS — I639 Cerebral infarction, unspecified: Secondary | ICD-10-CM | POA: Diagnosis not present

## 2022-11-02 DIAGNOSIS — N1831 Chronic kidney disease, stage 3a: Secondary | ICD-10-CM | POA: Diagnosis not present

## 2022-11-02 DIAGNOSIS — E785 Hyperlipidemia, unspecified: Secondary | ICD-10-CM | POA: Diagnosis not present

## 2022-11-02 DIAGNOSIS — I10 Essential (primary) hypertension: Secondary | ICD-10-CM | POA: Diagnosis not present

## 2022-11-02 DIAGNOSIS — R569 Unspecified convulsions: Secondary | ICD-10-CM | POA: Diagnosis not present

## 2022-11-02 DIAGNOSIS — M159 Polyosteoarthritis, unspecified: Secondary | ICD-10-CM | POA: Diagnosis not present

## 2022-11-02 DIAGNOSIS — J069 Acute upper respiratory infection, unspecified: Secondary | ICD-10-CM | POA: Diagnosis not present

## 2022-11-02 DIAGNOSIS — E7800 Pure hypercholesterolemia, unspecified: Secondary | ICD-10-CM | POA: Diagnosis not present

## 2022-11-08 DIAGNOSIS — G8929 Other chronic pain: Secondary | ICD-10-CM | POA: Diagnosis not present

## 2022-11-08 DIAGNOSIS — M545 Low back pain, unspecified: Secondary | ICD-10-CM | POA: Diagnosis not present

## 2022-11-08 DIAGNOSIS — R569 Unspecified convulsions: Secondary | ICD-10-CM | POA: Diagnosis not present

## 2022-11-08 DIAGNOSIS — I639 Cerebral infarction, unspecified: Secondary | ICD-10-CM | POA: Diagnosis not present

## 2022-11-08 DIAGNOSIS — N1831 Chronic kidney disease, stage 3a: Secondary | ICD-10-CM | POA: Diagnosis not present

## 2022-11-08 DIAGNOSIS — M159 Polyosteoarthritis, unspecified: Secondary | ICD-10-CM | POA: Diagnosis not present

## 2022-11-08 DIAGNOSIS — E1169 Type 2 diabetes mellitus with other specified complication: Secondary | ICD-10-CM | POA: Diagnosis not present

## 2022-11-08 DIAGNOSIS — E1140 Type 2 diabetes mellitus with diabetic neuropathy, unspecified: Secondary | ICD-10-CM | POA: Diagnosis not present

## 2022-11-08 DIAGNOSIS — J45909 Unspecified asthma, uncomplicated: Secondary | ICD-10-CM | POA: Diagnosis not present

## 2022-11-08 DIAGNOSIS — E7800 Pure hypercholesterolemia, unspecified: Secondary | ICD-10-CM | POA: Diagnosis not present

## 2022-11-08 DIAGNOSIS — I10 Essential (primary) hypertension: Secondary | ICD-10-CM | POA: Diagnosis not present

## 2022-11-08 DIAGNOSIS — E785 Hyperlipidemia, unspecified: Secondary | ICD-10-CM | POA: Diagnosis not present

## 2022-11-18 ENCOUNTER — Encounter: Payer: Self-pay | Admitting: Gastroenterology

## 2022-11-22 DIAGNOSIS — R1013 Epigastric pain: Secondary | ICD-10-CM | POA: Diagnosis not present

## 2022-11-22 DIAGNOSIS — E119 Type 2 diabetes mellitus without complications: Secondary | ICD-10-CM | POA: Diagnosis not present

## 2022-11-22 DIAGNOSIS — R1011 Right upper quadrant pain: Secondary | ICD-10-CM | POA: Diagnosis not present

## 2022-11-22 DIAGNOSIS — R109 Unspecified abdominal pain: Secondary | ICD-10-CM | POA: Diagnosis not present

## 2022-11-22 DIAGNOSIS — R079 Chest pain, unspecified: Secondary | ICD-10-CM | POA: Diagnosis not present

## 2022-11-23 DIAGNOSIS — M5416 Radiculopathy, lumbar region: Secondary | ICD-10-CM | POA: Diagnosis not present

## 2022-12-02 DIAGNOSIS — E119 Type 2 diabetes mellitus without complications: Secondary | ICD-10-CM | POA: Diagnosis not present

## 2022-12-02 DIAGNOSIS — I739 Peripheral vascular disease, unspecified: Secondary | ICD-10-CM | POA: Diagnosis not present

## 2022-12-02 DIAGNOSIS — M2041 Other hammer toe(s) (acquired), right foot: Secondary | ICD-10-CM | POA: Insufficient documentation

## 2022-12-02 DIAGNOSIS — L602 Onychogryphosis: Secondary | ICD-10-CM | POA: Diagnosis not present

## 2022-12-02 DIAGNOSIS — M2042 Other hammer toe(s) (acquired), left foot: Secondary | ICD-10-CM | POA: Diagnosis not present

## 2022-12-21 ENCOUNTER — Ambulatory Visit (INDEPENDENT_AMBULATORY_CARE_PROVIDER_SITE_OTHER): Payer: 59 | Admitting: Diagnostic Neuroimaging

## 2022-12-21 ENCOUNTER — Encounter: Payer: Self-pay | Admitting: Diagnostic Neuroimaging

## 2022-12-21 VITALS — BP 141/71 | HR 84 | Ht 63.0 in | Wt 153.0 lb

## 2022-12-21 DIAGNOSIS — G40909 Epilepsy, unspecified, not intractable, without status epilepticus: Secondary | ICD-10-CM

## 2022-12-21 NOTE — Progress Notes (Signed)
[facility]  PATIENT: Paula Hamilton DOB: [birth year]  REFERRING CLINICIAN: Noni Saupe, MD HISTORY FROM: patient  REASON FOR VISIT: follow up   HISTORICAL  CHIEF COMPLAINT:  Chief Complaint  Patient presents with   Follow-up    Patient in room #7 with her husband. Patient states she is well and stable with no new concerns.    HISTORY OF PRESENT ILLNESS:   UPDATE (12/21/22, VRP): Since last visit, doing well. No recurrent symptoms. No seizures.  No alleviating or aggravating factors. Tolerating LEV.    UPDATE (08/24/21, VRP): Since last visit, doing well until Dec 2022, had episode at [facility]. Shoplifters had triggered the alarms. Then she got suddenly confused, but back to baseline in a few minutes. No convulsions. No LOC.   PRIOR HPI (01/30/19): 72 year old female with hyperlipidemia, diabetes, chronic pain syndrome, here for evaluation of stroke versus seizure.  09/15/2018 patient was visiting [facility] when all of a sudden she had slurred speech and right-sided weakness.  She had confusion.  Blood pressure at home was 245/110 and blood glucose was 341.  Patient went to the hospital and was given IV TPA.  She was transferred to another hospital to neuro ICU.  Patient had 2 more attacks while in the hospital and therefore possibility of seizure was raised.  Patient was discharged on levetiracetam 500 mg twice a day.  Stroke work-up was completed.  Since that time no further events.  Patient is tolerating medication without difficulty.  She is not driving.  There is family history of seizure in her mother and nephew.  Other family members have history of stroke.   REVIEW OF SYSTEMS: Full 14 system review of systems performed and negative with exception of: As per HPI.  ALLERGIES: Allergies  Allergen Reactions   Codeine Rash and Swelling   Penicillins Rash    PATIENT HAS HAD A PCN REACTION WITH IMMEDIATE RASH, FACIAL/TONGUE/THROAT SWELLING, SOB,  OR LIGHTHEADEDNESS WITH HYPOTENSION:  #  #  #  YES  #  #  #   Has patient had a PCN reaction causing severe rash involving mucus membranes or skin necrosis: No Has patient had a PCN reaction that required hospitalization: No Has patient had a PCN reaction occurring within the last 10 years: No    Pregabalin Other (See Comments)    Suicidal thoughts, extreme depression   Atorvastatin    Doxycycline Swelling   Metformin Other (See Comments)    Profound muscle weakness whren mixed w/actos or januvia   Erythromycin Base Nausea And Vomiting    STOMACH PAIN   Morphine And Codeine Nausea And Vomiting    Has taken in pill form w/o side effects    HOME MEDICATIONS: Outpatient Medications Prior to Visit  Medication Sig Dispense Refill   acetaminophen (TYLENOL) 500 MG tablet Take 1,000 mg by mouth every 6 (six) hours as needed for mild pain.     albuterol (PROVENTIL HFA;VENTOLIN HFA) 108 (90 BASE) MCG/ACT inhaler Inhale 2 puffs into the lungs every 6 (six) hours as needed for wheezing.     atorvastatin (LIPITOR) 40 MG tablet Take 40 mg by mouth daily.     augmented betamethasone dipropionate (DIPROLENE-AF) 0.05 % cream Apply 0.05 % topically daily.     azithromycin (ZITHROMAX) 250 MG tablet Take 250 mg by mouth as directed.     clopidogrel (PLAVIX) 75 MG tablet Take 75 mg by mouth daily.     dicyclomine (BENTYL) 20 MG tablet Take 20  mg by mouth 2 (two) times daily.     diphenhydrAMINE (BENADRYL) 25 mg capsule Take 25 mg by mouth at bedtime as needed.      diphenoxylate-atropine (LOMOTIL) 2.5-0.025 MG tablet Take 1 tablet by mouth 3 (three) times daily as needed for diarrhea or loose stools. 90 tablet 1   DULoxetine (CYMBALTA) 60 MG capsule Take 60 mg by mouth daily.     Insulin Degludec-Liraglutide (XULTOPHY) 100-3.6 UNIT-MG/ML SOPN Inject 30 Units into the skin daily.     levETIRAcetam (KEPPRA) 500 MG tablet Take 1 tablet (500 mg total) by mouth 2 (two) times daily. 180 tablet 4   lisinopril  (ZESTRIL) 20 MG tablet Take 20 mg by mouth daily.     rosuvastatin (CRESTOR) 10 MG tablet Take 1 tablet by mouth at bedtime.     Vitamin D, Ergocalciferol, (DRISDOL) 1.25 MG (50000 UNIT) CAPS capsule Take 50,000 Units by mouth 2 (two) times a week.     No facility-administered medications prior to visit.    PAST MEDICAL HISTORY: Past Medical History:  Diagnosis Date   3rd cranial nerve palsy, right    just start 03/13/2017   Anxiety    Arthritis    listhesis, instability   Asthma    Bronchitis    just finished Z pack and is taking cough syrup with Codiene for cough   CAD (coronary artery disease)    Chronic pain    COPD (chronic obstructive pulmonary disease) (HCC)    Depression    Diabetes mellitus without complication (HCC)    fasting 270s   Diabetic retinopathy (HCC)    Dysphagia    Fibromyalgia    GERD (gastroesophageal reflux disease)    Headache(784.0)    last migraine > 1 yr.    Heart murmur    mentioned yrs. ago, never mentioned again.    History of colon polyps    Hx of migraines    Hypercholesterolemia    Hypertension    Mental disorder    bipolar disorder   Neuropathy    Pneumonia    6 years ago   PONV (postoperative nausea and vomiting)    has had other surgeries with no problems   Seizures (HCC) 09/2018   Shortness of breath    Stroke (HCC) 09/15/2018   multiple    PAST SURGICAL HISTORY: Past Surgical History:  Procedure Laterality Date   ABDOMINAL HYSTERECTOMY     APPENDECTOMY     age 82   BACK SURGERY     lower back 4 time, tens surgery too   CERVICAL FUSION     COLONOSCOPY W/ BIOPSIES AND POLYPECTOMY  11/21/2014   Colonic polyp status post polypectomy. benign. Mild sigmoid diverticulosis.   ESOPHAGOGASTRODUODENOSCOPY  02/26/2016   Moderate gastritis. Otherwise normal EGD    ESOPHAGOGASTRODUODENOSCOPY  08/24/20117   Mioderate gastritis. OTherwise normal EGD.    EYE SURGERY     laser surgery on left eye    MAXIMUM ACCESS (MAS)POSTERIOR  LUMBAR INTERBODY FUSION (PLIF) 1 LEVEL N/A 04/12/2013   Procedure: Lumba four-five maximum access surgery Posterior Lumbar Interbody Fusion;  Surgeon: Tia Alert, MD;  Location: [facility];  Service: Neurosurgery;  Laterality: N/A;  Lumba four-five maximum access surgery Posterior Lumbar Interbody Fusion   SPINAL CORD STIMULATOR INSERTION N/A 06/14/2014   Procedure: LUMBAR SPINAL CORD STIMULATOR INSERTION;  Surgeon: Gwynne Edinger, MD;  Location: [facility];  Service: Neurosurgery;  Laterality: N/A;   SPINAL CORD STIMULATOR REMOVAL N/A 04/12/2017  Procedure: LUMBAR SPINAL CORD STIMULATOR REMOVAL;  Surgeon: Odette Fraction, MD;  Location: [facility] OR;  Service: Neurosurgery;  Laterality: N/A;   TUBAL LIGATION  1984    FAMILY HISTORY: Family History  Problem Relation Age of Onset   Breast cancer Mother    Lung cancer Mother    COPD Mother    Melanoma Father    Stroke Sister        in 2   Diabetes Sister    Stroke Brother        in 2   Diabetes Brother    Diabetes Brother    Stomach cancer Paternal Aunt    Colon cancer Paternal Uncle    Kidney cancer Paternal Uncle    Esophageal cancer Neg Hx     SOCIAL HISTORY: Social History   Socioeconomic History   Marital status: Widowed    Spouse name: Not on file   Number of children: 2   Years of education: Not on file   Highest education level: Some college, no degree  Occupational History    Comment: retired  Tobacco Use   Smoking status: Former    Packs/day: 1.00    Years: 30.00    Additional pack years: 0.00    Total pack years: 30.00    Types: Cigarettes    Quit date: 05/22/2011    Years since quitting: 11.5   Smokeless tobacco: Never  Vaping Use   Vaping Use: Former  Substance and Sexual Activity   Alcohol use: No   Drug use: No   Sexual activity: Not on file  Other Topics Concern   Not on file  Social History Narrative   Lives with sister, bro-in-law   Caffeine- not much   Social Determinants of Health    Financial Resource Strain: Not on file  Food Insecurity: Not on file  Transportation Needs: Not on file  Physical Activity: Not on file  Stress: Not on file  Social Connections: Not on file  Intimate Partner Violence: Not on file     PHYSICAL EXAM  GENERAL EXAM/CONSTITUTIONAL: Vitals:  Vitals:   12/21/22 1650  BP: (!) 141/71  Pulse: 84  Weight: 153 lb (69.4 kg)  Height: 5\' 3"  (1.6 m)   Body mass index is 27.1 kg/m. Wt Readings from Last 3 Encounters:  12/21/22 153 lb (69.4 kg)  08/24/21 151 lb 6.4 oz (68.7 kg)  09/07/19 145 lb (65.8 kg)   Patient is in no distress; well developed, nourished and groomed; neck is supple  CARDIOVASCULAR: Examination of carotid arteries is normal; no carotid bruits Regular rate and rhythm, no murmurs Examination of peripheral vascular system by observation and palpation is normal  EYES: Ophthalmoscopic exam of optic discs and posterior segments is normal; no papilledema or hemorrhages No results found.  MUSCULOSKELETAL: Gait, strength, tone, movements noted in Neurologic exam below  NEUROLOGIC: MENTAL STATUS:      No data to display         awake, alert, oriented to person, place and time recent and remote memory intact normal attention and concentration language fluent, comprehension intact, naming intact fund of knowledge appropriate  CRANIAL NERVE:  2nd - no papilledema on fundoscopic exam 2nd, 3rd, 4th, 6th - pupils equal and reactive to light, visual fields full to confrontation, extraocular muscles intact, no nystagmus 5th - facial sensation symmetric 7th - facial strength symmetric 8th - hearing intact 9th - palate elevates symmetrically, uvula midline 11th - shoulder shrug symmetric 12th - tongue protrusion midline HOARSE  VOICE  MOTOR:  normal bulk and tone, full strength in the BUE, BLE  SENSORY:  normal and symmetric to light touch, temperature, vibration; EXCEPT DECR IN FEET  COORDINATION:   finger-nose-finger, fine finger movements normal  REFLEXES:  deep tendon reflexes TRACE and symmetric  GAIT/STATION:  ANTALGIC GAIT     DIAGNOSTIC DATA (LABS, IMAGING, TESTING) - I reviewed patient records, labs, notes, testing and imaging myself where available.  Lab Results  Component Value Date   WBC 14.4 (H) 07/11/2018   HGB 14.1 07/11/2018   HCT 42.3 07/11/2018   MCV 95.6 07/11/2018   PLT 460.0 (H) 07/11/2018      Component Value Date/Time   NA 139 07/11/2018 1450   K 5.8 (H) 07/11/2018 1450   CL 107 07/11/2018 1450   CO2 23 07/11/2018 1450   GLUCOSE 214 (H) 07/11/2018 1450   BUN 25 (H) 07/11/2018 1450   CREATININE 1.33 (H) 07/11/2018 1450   CALCIUM 9.7 07/11/2018 1450   PROT 6.3 07/11/2018 1450   ALBUMIN 3.8 07/11/2018 1450   AST 10 07/11/2018 1450   ALT 11 07/11/2018 1450   ALKPHOS 117 07/11/2018 1450   BILITOT 0.3 07/11/2018 1450   GFRNONAA >60 04/11/2017 0853   GFRAA >60 04/11/2017 0853   Lab Results  Component Value Date   CHOL 128 09/07/2019   HDL 35 (L) 09/07/2019   LDLCALC 69 09/07/2019   TRIG 158 (H) 09/07/2019   CHOLHDL 3.7 09/07/2019   Lab Results  Component Value Date   HGBA1C 9.7 (H) 07/11/2018   No results found for: "VITAMINB12" Lab Results  Component Value Date   TSH 1.50 07/11/2018    09/17/18 CT head - no acute finding  09/15/18 CTA head / neck -No large vessel occlusion; no aneurysmal dilation -Right ICA 55% origin stenosis -Left ICA no stenosis  09/16/18 MRI brain -No acute infarct or other intracranial finding   ASSESSMENT AND PLAN  72 y.o. year old female here with 3 episodes of slurred speech and right-sided facial weakness, may have represented TIA versus partial seizure.  Stroke work-up was completed.  Patient is tolerating levetiracetam 500 mg twice a day.   Ddx: TIA vs seizure  1. 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      Suanne Marker, MD 12/21/2022, 5:15 PM Certified in Neurology, Neurophysiology and Neuroimaging  [facility] [address], [address] [place], [place] [address] [phone]

## 2022-12-21 NOTE — Patient Instructions (Signed)
  SEIZURE PREVENTION (since March 2020; in setting of hypertension and hyperglycemia) - continue levetiracetam 500mg  twice a day  - after upcoming lumbar spine surgery, then may consider to wean off; will check EEG first, then slow taper  STROKE PREVENTION - continue aspirin, DM control, statin, BP control

## 2023-02-03 DIAGNOSIS — J40 Bronchitis, not specified as acute or chronic: Secondary | ICD-10-CM

## 2023-02-03 HISTORY — DX: Bronchitis, not specified as acute or chronic: J40

## 2023-02-18 ENCOUNTER — Ambulatory Visit: Payer: Medicare Other | Admitting: Gastroenterology

## 2023-03-30 ENCOUNTER — Other Ambulatory Visit: Payer: Self-pay | Admitting: Neurological Surgery

## 2023-04-28 DIAGNOSIS — E1151 Type 2 diabetes mellitus with diabetic peripheral angiopathy without gangrene: Secondary | ICD-10-CM | POA: Insufficient documentation

## 2023-05-03 ENCOUNTER — Encounter (HOSPITAL_COMMUNITY): Payer: Self-pay

## 2023-05-03 NOTE — Progress Notes (Signed)
PCP - Dr Simone Curia Cardiologist - none Neurology - Dr Joycelyn Schmid  Chest x-ray - 11/22/22 CE EKG - 11/22/22 CE- Requested Stress Test - n/a ECHO - 09/15/18 Cardiac Cath - n/a  ICD Pacemaker/Loop - n/a  Sleep Study -  n/a  Diabetes Type 2 Fasting CBG - 120s How many times a day do you check blood sugar - 1  Last dose of Plavix was on 05/02/23 per patient.  Follow surgeon's instructions when to stop ASA prior to surgery.  Per patient, continue ASA per MD instructions.  NPO  Anesthesia review: Yes  STOP now taking any Aspirin (unless otherwise instructed by your surgeon), Aleve, Naproxen, Ibuprofen, Motrin, Mobic, Advil, Goody's, BC's, all herbal medications, fish oil, and all vitamins.   Coronavirus Screening Do you have any of the following symptoms:  Cough yes/no: No Fever (>100.64F)  yes/no: No Runny nose yes/no: No Sore throat yes/no: No Difficulty breathing/shortness of breath  yes/no: No  Have you traveled in the last 14 days and where? yes/no: No  Patient verbalized understanding of instructions that were given to them at the PAT appointment. Patient was also instructed that they will need to review over the PAT instructions again at home before surgery.

## 2023-05-03 NOTE — Progress Notes (Addendum)
Surgical Instructions    Your procedure is scheduled on Wednesday, 05/11/23.  Report to [facility] Main Entrance "A" at 6:30 A.M., then check in with the Admitting office.  Call this number if you have problems the morning of surgery:  [phone]   If you have any questions prior to your surgery date call [phone]: Open Monday-Friday 8am-4pm If you experience any cold or flu symptoms such as cough, fever, chills, shortness of breath, etc. between now and your scheduled surgery, please notify us at the above number     Remember:  Do not eat or drink after midnight the night before your surgery     Take these medicines the morning of surgery with A SIP OF WATER:  acetaminophen (TYLENOL) if needed amLODipine (NORVASC)  Keppra   INSULIN  MEDICATION INSTRUCTIONS (Xultophy)  Do not take Xultophy the day before the procedure and the morning of your surgery.  Last dose will be on Monday, 05/09/23.    As of today, STOP taking any Aspirin (unless otherwise instructed by your surgeon) Aleve, Naproxen, Ibuprofen, Mobic, Motrin, Advil, Goody's, BC's, all herbal medications, fish oil, and all vitamins.  Last dose of Plavix will be on 05/05/23          Do not wear jewelry or makeup. Do not wear lotions, powders, perfumes or deodorant. Do not shave 48 hours prior to surgery.   Do not bring valuables to the hospital. Do not wear nail polish, gel polish, artificial nails, or any other type of covering on natural nails (fingers and toes) If you have artificial nails or gel coating that need to be removed by a nail salon, please have this removed prior to surgery. Artificial nails or gel coating may interfere with anesthesia's ability to adequately monitor your vital signs.  [facility] is not responsible for any belongings or valuables.      Contacts, glasses, hearing aids, dentures or partials may not be worn into surgery, please bring cases for these belongings   For patients admitted  to the hospital, discharge time will be determined by your treatment team.     SURGICAL WAITING ROOM VISITATION Patients having surgery or a procedure may have no more than 2 support people in the waiting area - these visitors may rotate.   Children under the age of 13 must have an adult with them who is not the patient. If the patient needs to stay at the hospital during part of their recovery, the visitor guidelines for inpatient rooms apply. Pre-op nurse will coordinate an appropriate time for 1 support person to accompany patient in pre-op.  This support person may not rotate.   Please refer to [email or web address] for the visitor guidelines for Inpatients (after your surgery is over and you are in a regular room).    Special instructions:    Oral Hygiene is also important to reduce your risk of infection.  Remember - BRUSH YOUR TEETH THE MORNING OF SURGERY WITH YOUR REGULAR TOOTHPASTE   [facility]- Preparing For Surgery  Before surgery, you can play an important role. Because skin is not sterile, your skin needs to be as free of germs as possible. You can reduce the number of germs on your skin by washing with CHG (chlorahexidine gluconate) Soap before surgery.  CHG is an antiseptic cleaner which kills germs and bonds with the skin to continue killing germs even after washing.     Please do not use if you have an allergy to CHG or antibacterial  soaps. If your skin becomes reddened/irritated stop using the CHG.  Do not shave (including legs and underarms) for at least 48 hours prior to first CHG shower. It is OK to shave your face.  Please follow these instructions carefully.   Pre-operative 5 CHG Bath Instructions   You can play a key role in reducing the risk of infection after surgery. Your skin needs to be as free of germs as possible. You can reduce the number of germs on your skin by washing with CHG (chlorhexidine gluconate)  soap before surgery. CHG is an antiseptic soap that kills germs and continues to kill germs even after washing.   DO NOT use if you have an allergy to chlorhexidine/CHG or antibacterial soaps. If your skin becomes reddened or irritated, stop using the CHG and notify one of our RNs at [phone].   Please shower with the CHG soap starting 4 days before surgery using the following schedule:     Please keep in mind the following:  DO NOT shave, including legs and underarms, starting the day of your first shower.   You may shave your face at any point before/day of surgery.  Place clean sheets on your bed the day you start using CHG soap. Use a clean washcloth (not used since being washed) for each shower. DO NOT sleep with pets once you start using the CHG.   CHG Shower Instructions:  If you choose to wash your hair and private area, wash first with your normal shampoo/soap.  After you use shampoo/soap, rinse your hair and body thoroughly to remove shampoo/soap residue.  Turn the water OFF and apply about 3 tablespoons (45 ml) of CHG soap to a CLEAN washcloth.  Apply CHG soap ONLY FROM YOUR NECK DOWN TO YOUR TOES (washing for 3-5 minutes)  DO NOT use CHG soap on face, private areas, open wounds, or sores.  Pay special attention to the area where your surgery is being performed.  If you are having back surgery, having someone wash your back for you may be helpful. Wait 2 minutes after CHG soap is applied, then you may rinse off the CHG soap.  Pat dry with a clean towel  Put on clean clothes/pajamas   If you choose to wear lotion, please use ONLY the CHG-compatible lotions on the back of this paper.     Additional instructions for the day of surgery: DO NOT APPLY any lotions, deodorants, cologne, or perfumes.   Put on clean/comfortable clothes.  Brush your teeth.  Ask your nurse before applying any prescription medications to the skin.      CHG Compatible Lotions   Aveeno  Moisturizing lotion  Cetaphil Moisturizing Cream  Cetaphil Moisturizing Lotion  Clairol Herbal Essence Moisturizing Lotion, Dry Skin  Clairol Herbal Essence Moisturizing Lotion, Extra Dry Skin  Clairol Herbal Essence Moisturizing Lotion, Normal Skin  Curel Age Defying Therapeutic Moisturizing Lotion with Alpha Hydroxy  Curel Extreme Care Body Lotion  Curel Soothing Hands Moisturizing Hand Lotion  Curel Therapeutic Moisturizing Cream, Fragrance-Free  Curel Therapeutic Moisturizing Lotion, Fragrance-Free  Curel Therapeutic Moisturizing Lotion, Original Formula  Eucerin Daily Replenishing Lotion  Eucerin Dry Skin Therapy Plus Alpha Hydroxy Crme  Eucerin Dry Skin Therapy Plus Alpha Hydroxy Lotion  Eucerin Original Crme  Eucerin Original Lotion  Eucerin Plus Crme Eucerin Plus Lotion  Eucerin TriLipid Replenishing Lotion  Keri Anti-Bacterial Hand Lotion  Keri Deep Conditioning Original Lotion Dry Skin Formula Softly Scented  Keri Deep Conditioning Original Lotion, Fragrance Free Sensitive  Skin Formula  Keri Lotion Fast Absorbing Fragrance Free Sensitive Skin Formula  Keri Lotion Fast Absorbing Softly Scented Dry Skin Formula  Keri Original Lotion  Keri Skin Renewal Lotion Keri Silky Smooth Lotion  Keri Silky Smooth Sensitive Skin Lotion  Nivea Body Creamy Conditioning Oil  Nivea Body Extra Enriched Teacher, adult education Moisturizing Lotion Nivea Crme  Nivea Skin Firming Lotion  NutraDerm 30 Skin Lotion  NutraDerm Skin Lotion  NutraDerm Therapeutic Skin Cream  NutraDerm Therapeutic Skin Lotion  ProShield Protective Hand Cream  Provon moisturizing lotion

## 2023-05-04 ENCOUNTER — Other Ambulatory Visit: Payer: Self-pay

## 2023-05-04 ENCOUNTER — Encounter (HOSPITAL_COMMUNITY): Payer: Self-pay

## 2023-05-04 ENCOUNTER — Encounter (HOSPITAL_COMMUNITY)
Admission: RE | Admit: 2023-05-04 | Discharge: 2023-05-04 | Disposition: A | Discharge status: Home / Self Care | Payer: 59 | Source: Ambulatory Visit | Attending: Neurological Surgery | Admitting: Neurological Surgery

## 2023-05-04 DIAGNOSIS — Z8673 Personal history of transient ischemic attack (TIA), and cerebral infarction without residual deficits: Secondary | ICD-10-CM | POA: Diagnosis not present

## 2023-05-04 DIAGNOSIS — F17210 Nicotine dependence, cigarettes, uncomplicated: Secondary | ICD-10-CM | POA: Insufficient documentation

## 2023-05-04 DIAGNOSIS — Z01818 Encounter for other preprocedural examination: Secondary | ICD-10-CM

## 2023-05-04 DIAGNOSIS — M47819 Spondylosis without myelopathy or radiculopathy, site unspecified: Secondary | ICD-10-CM | POA: Insufficient documentation

## 2023-05-04 DIAGNOSIS — M797 Fibromyalgia: Secondary | ICD-10-CM | POA: Insufficient documentation

## 2023-05-04 DIAGNOSIS — Z01812 Encounter for preprocedural laboratory examination: Secondary | ICD-10-CM | POA: Insufficient documentation

## 2023-05-04 DIAGNOSIS — E1136 Type 2 diabetes mellitus with diabetic cataract: Secondary | ICD-10-CM | POA: Insufficient documentation

## 2023-05-04 DIAGNOSIS — M5416 Radiculopathy, lumbar region: Secondary | ICD-10-CM | POA: Diagnosis not present

## 2023-05-04 DIAGNOSIS — E7800 Pure hypercholesterolemia, unspecified: Secondary | ICD-10-CM | POA: Diagnosis not present

## 2023-05-04 DIAGNOSIS — K219 Gastro-esophageal reflux disease without esophagitis: Secondary | ICD-10-CM | POA: Diagnosis not present

## 2023-05-04 DIAGNOSIS — J4489 Other specified chronic obstructive pulmonary disease: Secondary | ICD-10-CM | POA: Insufficient documentation

## 2023-05-04 DIAGNOSIS — E1122 Type 2 diabetes mellitus with diabetic chronic kidney disease: Secondary | ICD-10-CM | POA: Diagnosis not present

## 2023-05-04 DIAGNOSIS — N189 Chronic kidney disease, unspecified: Secondary | ICD-10-CM | POA: Diagnosis not present

## 2023-05-04 DIAGNOSIS — Z981 Arthrodesis status: Secondary | ICD-10-CM | POA: Diagnosis not present

## 2023-05-04 DIAGNOSIS — I1310 Hypertensive heart and chronic kidney disease without heart failure, with stage 1 through stage 4 chronic kidney disease, or unspecified chronic kidney disease: Secondary | ICD-10-CM | POA: Diagnosis not present

## 2023-05-04 DIAGNOSIS — I700 Atherosclerosis of aorta: Secondary | ICD-10-CM | POA: Diagnosis not present

## 2023-05-04 HISTORY — DX: Dependence on other enabling machines and devices: Z99.89

## 2023-05-04 HISTORY — DX: Chronic kidney disease, unspecified: N18.9

## 2023-05-04 HISTORY — DX: Other amnesia: R41.3

## 2023-05-04 LAB — CBC
HCT: 42.5 % (ref 36.0–46.0)
Hemoglobin: 13.6 g/dL (ref 12.0–15.0)
MCH: 31.3 pg (ref 26.0–34.0)
MCHC: 32 g/dL (ref 30.0–36.0)
MCV: 97.9 fL (ref 80.0–100.0)
Platelets: 360 10*3/uL (ref 150–400)
RBC: 4.34 MIL/uL (ref 3.87–5.11)
RDW: 12.8 % (ref 11.5–15.5)
WBC: 11.4 10*3/uL — ABNORMAL HIGH (ref 4.0–10.5)
nRBC: 0 % (ref 0.0–0.2)

## 2023-05-04 LAB — HEMOGLOBIN A1C
Hgb A1c MFr Bld: 7.8 % — ABNORMAL HIGH (ref 4.8–5.6)
Mean Plasma Glucose: 177.16 mg/dL

## 2023-05-04 LAB — BASIC METABOLIC PANEL
Anion gap: 10 (ref 5–15)
BUN: 21 mg/dL (ref 8–23)
CO2: 25 mmol/L (ref 22–32)
Calcium: 9.6 mg/dL (ref 8.9–10.3)
Chloride: 106 mmol/L (ref 98–111)
Creatinine, Ser: 1.28 mg/dL — ABNORMAL HIGH (ref 0.44–1.00)
GFR, Estimated: 45 mL/min — ABNORMAL LOW (ref >60)
Glucose, Bld: 129 mg/dL — ABNORMAL HIGH (ref 70–99)
Potassium: 5.4 mmol/L — ABNORMAL HIGH (ref 3.5–5.1)
Sodium: 141 mmol/L (ref 135–145)

## 2023-05-04 LAB — SURGICAL PCR SCREEN
MRSA, PCR: NEGATIVE
Staphylococcus aureus: NEGATIVE

## 2023-05-04 LAB — TYPE AND SCREEN
ABO/RH(D): O POS
Antibody Screen: NEGATIVE

## 2023-05-04 LAB — PROTIME-INR
INR: 0.9 (ref 0.8–1.2)
Prothrombin Time: 12.4 s (ref 11.4–15.2)

## 2023-05-04 LAB — GLUCOSE, CAPILLARY: Glucose-Capillary: 143 mg/dL — ABNORMAL HIGH (ref 70–99)

## 2023-05-05 ENCOUNTER — Encounter (HOSPITAL_COMMUNITY): Payer: Self-pay

## 2023-05-05 NOTE — Progress Notes (Addendum)
Anesthesia Chart Review:  Case: [id number] Date/Time: 05/11/23 0815   Procedure: PLIF L34 (Back) - 3C   Anesthesia type: General   Pre-op diagnosis: RADICULOPATHY, LUMBAR REGION   Location: [facility] ROOM 20 / [facility]   Surgeons: Arman Bogus, MD       DISCUSSION: Patient is a 72 year old female scheduled for the above procedure.  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  Last evaluated by neurologist Dr. Marjory Lies on 12/21/22. Seizure 09/2018 in setting of CVA, HTN, hyperglycemia. He noted she has upcoming surgery, so will continue Keppra, but consider getting repeat EEG and weaning off later. One year follow-up planned.   Anesthesia APP was not asked to evaluate during her PAT visit, so I called and spoke with her. She denied CAD, CHF, irregular HR, MI. She had never been followed by cardiology. She did have coronary calcifications on 07/22/20 CT LCS. She was told in the past she may have a murmur, but has not heard mention of it recently. She thinks her last echo was in 2021 at [facility] for TIA admission. According to the Discharge Summary it showed LVEF 60-65%, normal global LV contractility, borderline LVH, mild AV sclerosis with suspected bicuspid AV, trace MR, mild TR. There was no mention of any aortic dilation or AS or AR. I reviewed those results with her and discussed that some recommendations would advised repeat echo in 3-5 years given possible bicuspid AV, so I encouraged her to discuss with her PCP for future follow-up. I later received the full report from [facility]. Ao Diameter was 2.9 cm. The AV measurements seen on the  report were AV Vmax 1.55 m/s and AVA Vmax, Pt: 2.26 cm2.    She denied chest pain, SOB, syncope. She has mild chronic LE edema which she feels is stable for years. She says she cannot walk very far for the past several months due to back and leg pain. Prior to that she could do her ADLs, clean her home including vacuuming/sweeping, go get groceries. No orthopnea, although does not tend to sleep on her back. She is not on home O2. She does smoke 1/2 PPD. Last treated for bronchitis in August 2024 but feels recovered. She does have a raspy/course sounding voice.   She is on Plavix for CVA history. Last Plavix 05/02/23. She reported that Dr. Yetta Barre told her to continue ASA 81 mg given her history of CVA. (I discussed this with Erie Noe at Dr. Lonie Peak office as well.)  Creatinine 1.28 which appears consistent with prior results. She does not see a nephrologist. K 5.4 without mention of hemolysis. Meds include ACEi and hydrochlorothiazide. Will order an iSTAT8 for the day of surgery to recheck potassium.   Discussed above with anesthesiologist Harlon Ditty, MD. Anesthesia team to evaluate on the day of surgery.    VS: BP (!) 150/74   Pulse 79   Temp 36.8 C   Resp 16   Ht 5\' 4"  (1.626 m)   Wt 60.2 kg   SpO2 99%   BMI 22.80 kg/m    PROVIDERS: Simone Curia, MD is PCP ([facility] in [place]) Marjory Lies, Satira Sark, MD is neurologist   LABS: Preoperative labs noted. K 5.4 without mention of  hemolysis. Creatinine 1.28, BUN 21, eGFR 45. A1c 7.8%.  (all labs ordered are listed, but only abnormal results are displayed)  Labs Reviewed  GLUCOSE, CAPILLARY - Abnormal; Notable for the following components:      Result Value   Glucose-Capillary 143 (*)    All other components within normal limits  BASIC METABOLIC PANEL - Abnormal; Notable for the following components:   Potassium 5.4 (*)    Glucose, Bld 129 (*)    Creatinine, Ser 1.28 (*)    GFR, Estimated 45 (*)    All other components  within normal limits  HEMOGLOBIN A1C - Abnormal; Notable for the following components:   Hgb A1c MFr Bld 7.8 (*)    All other components within normal limits  CBC - Abnormal; Notable for the following components:   WBC 11.4 (*)    All other components within normal limits  SURGICAL PCR SCREEN  PROTIME-INR  TYPE AND SCREEN     IMAGES: MRI L-spine 12/08/22 ([facility]): IMPRESSION: 1. Progressive adjacent segment disease at L3-L4 with worsened now moderate to severe spinal canal and left lateral recess stenosis and moderate left neuroforaminal stenosis. 2. Prior L2-L3 and L4-L5 PLIF without residual stenosis. 3. Progressive mild disc bulging and moderate right facet arthropathy at L5-S1 without stenosis.   CT Abd/pelvis 11/22/22 ([facility]): IMPRESSION:  9 x 7 mm irregular nodular density seen in right middle lobe.  Non-contrast chest CT at 6-12 months is recommended. If the nodule  is stable at time of repeat CT, then future CT at 18-24 months (from  today's scan) is considered optional for low-risk patients, but is  recommended for high-risk patients. This recommendation follows the  consensus statement: Guidelines for Management of Incidental  Pulmonary Nodules Detected on CT Images: From the Fleischner Society  2017; Radiology 2017; 284:228-243.  - No acute abnormality seen in the abdomen or pelvis.  - Aortic Atherosclerosis (ICD10-I70.0).   CXR 11/22/22 ([facility]): FINDINGS:  The heart size and mediastinal contours are within normal limits.  Both lungs are clear. The visualized skeletal structures are  unremarkable.  IMPRESSION: No active cardiopulmonary disease.   EKG: 11/22/22 ([facility]): SR at 100 bpm. (Tracing copy on shadow chart)   CV: US Carotid 07/26/20 [facility]): IMPRESSION:  Less than 50% stenosis of each carotid bifurcation. Vertebral  arteries are antegrade.    Echo 07/29/2019 [facility], [facility], D[facility],  07/30/19): Conclusions: 1.  There is borderline concentric left ventricular hypertrophy. 2.  There is normal global left ventricular contractility.  LVEF 60 to 65%. 3.  The diastolic filling pattern indicates impaired relaxation. 4.  The right ventricle is normal in size and function. 5.  No evidence of interatrial communication by color flow Doppler analysis. 6.  There is mild aortic valve sclerosis.  TDS.  Suspect bicuspid valve. (Ao Diameter was 2.9 cm. The AV measurements seen on the report were AV Vmax 1.55 m/s and AVA Vmax, Pt: 2.26 cm2.)   7.  There is trace mitral regurgitation. 8.  Mild tricuspid regurgitation present.  RVSP 37 mmHg.    Past Medical History:  Diagnosis Date   3rd cranial nerve palsy, right    just start 03/13/2017   Ambulates with cane    straight   Anxiety    Arthritis    listhesis, instability   Asthma    Bronchitis 02/2023   just finished Z pack and is taking cough syrup with Codiene for cough   Chronic kidney disease  Chronic pain    COPD (chronic obstructive pulmonary disease) (HCC)    Depression    Diabetes mellitus without complication (HCC)    type 2   Diabetic retinopathy (HCC)    Dysphagia    Fibromyalgia    GERD (gastroesophageal reflux disease)    Headache(784.0)    Heart murmur    mentioned yrs. ago, never mentioned again.    History of colon polyps    Hx of migraines    Hypercholesterolemia    Hypertension    Memory loss    mild since stroke   Mental disorder    bipolar disorder - patient denies this dx as of 05/04/23.   Neuropathy    legs and feet   Pneumonia    x 1 yrs ago   PONV (postoperative nausea and vomiting)    has had other surgeries with no problems   Seizures (HCC) 09/2018   w/stroke in 2020   Shortness of breath    Stroke (HCC) 09/15/2018   multiple   Walker as ambulation aid    rollator walker    Past Surgical History:  Procedure Laterality Date   ABDOMINAL HYSTERECTOMY     APPENDECTOMY     age 64    BACK SURGERY     lower back 4 time, tens surgery too   CERVICAL FUSION     COLONOSCOPY W/ BIOPSIES AND POLYPECTOMY  11/21/2014   Colonic polyp status post polypectomy. benign. Mild sigmoid diverticulosis.   ESOPHAGOGASTRODUODENOSCOPY  02/26/2016   Moderate gastritis. Otherwise normal EGD    EYE SURGERY     laser surgery on left eye 2023; cataracts removed bilateral 2023   MAXIMUM ACCESS (MAS)POSTERIOR LUMBAR INTERBODY FUSION (PLIF) 1 LEVEL N/A 04/12/2013   Procedure: Lumba four-five maximum access surgery Posterior Lumbar Interbody Fusion;  Surgeon: Tia Alert, MD;  Location: [facility] NEURO ORS;  Service: Neurosurgery;  Laterality: N/A;  Lumba four-five maximum access surgery Posterior Lumbar Interbody Fusion   SPINAL CORD STIMULATOR INSERTION N/A 06/14/2014   Procedure: LUMBAR SPINAL CORD STIMULATOR INSERTION;  Surgeon: Gwynne Edinger, MD;  Location: [facility] NEURO ORS;  Service: Neurosurgery;  Laterality: N/A;   SPINAL CORD STIMULATOR REMOVAL N/A 04/12/2017   Procedure: LUMBAR SPINAL CORD STIMULATOR REMOVAL;  Surgeon: Odette Fraction, MD;  Location: [facility] OR;  Service: Neurosurgery;  Laterality: N/A;   TUBAL LIGATION  1984    MEDICATIONS:  aspirin EC 81 MG tablet   acetaminophen (TYLENOL) 500 MG tablet   amLODipine (NORVASC) 5 MG tablet   clopidogrel (PLAVIX) 75 MG tablet   diphenhydrAMINE (BENADRYL) 25 mg capsule   diphenoxylate-atropine (LOMOTIL) 2.5-0.025 MG tablet   hydrochlorothiazide (MICROZIDE) 12.5 MG capsule   Insulin Degludec-Liraglutide (XULTOPHY) 100-3.6 UNIT-MG/ML SOPN   levETIRAcetam (KEPPRA) 500 MG tablet   lisinopril (ZESTRIL) 20 MG tablet   meloxicam (MOBIC) 15 MG tablet   No current facility-administered medications for this encounter.    Shonna Chock, PA-C Surgical Short Stay/Anesthesiology [facility] Phone [phone] [facility] Phone [phone] 05/06/2023 10:41 AM

## 2023-05-06 NOTE — Anesthesia Preprocedure Evaluation (Signed)
Anesthesia Evaluation  Patient identified by MRN, date of birth, ID band Patient awake    Reviewed: Allergy & Precautions, NPO status , Patient's Chart, lab work & pertinent test results  History of Anesthesia Complications (+) PONV and history of anesthetic complications  Airway Mallampati: II  TM Distance: >3 FB Neck ROM: Full    Dental  (+) Dental Advisory Given   Pulmonary asthma , COPD, Current Smoker   breath sounds clear to auscultation       Cardiovascular hypertension, Pt. on medications  Rhythm:Regular Rate:Normal     Neuro/Psych Seizures -, Well Controlled,   Neuromuscular disease CVA    GI/Hepatic Neg liver ROS,GERD  ,,  Endo/Other  diabetes, Type 2, Insulin Dependent    Renal/GU CRFRenal disease     Musculoskeletal  (+) Arthritis ,  Fibromyalgia -  Abdominal   Peds  Hematology negative hematology ROS (+)   Anesthesia Other Findings   Reproductive/Obstetrics                             Anesthesia Physical Anesthesia Plan  ASA: 3  Anesthesia Plan: General   Post-op Pain Management: Tylenol PO (pre-op)* and Ketamine IV*   Induction: Intravenous  PONV Risk Score and Plan: 3 and Dexamethasone and Ondansetron  Airway Management Planned: Oral ETT  Additional Equipment:   Intra-op Plan:   Post-operative Plan: Extubation in OR  Informed Consent: I have reviewed the patients History and Physical, chart, labs and discussed the procedure including the risks, benefits and alternatives for the proposed anesthesia with the patient or authorized representative who has indicated his/her understanding and acceptance.     Dental advisory given  Plan Discussed with: CRNA  Anesthesia Plan Comments: (PAT note written 05/06/2023 by Shonna Chock, PA-C.  )       Anesthesia Quick Evaluation

## 2023-05-11 ENCOUNTER — Inpatient Hospital Stay (HOSPITAL_COMMUNITY): Payer: 59 | Admitting: Vascular Surgery

## 2023-05-11 ENCOUNTER — Encounter (HOSPITAL_COMMUNITY)
Admission: RE | Disposition: A | Discharge status: Home / Self Care | Payer: Self-pay | Source: Home / Self Care | Attending: Neurological Surgery

## 2023-05-11 ENCOUNTER — Other Ambulatory Visit: Payer: Self-pay

## 2023-05-11 ENCOUNTER — Inpatient Hospital Stay (HOSPITAL_COMMUNITY)
Admission: RE | Admit: 2023-05-11 | Discharge: 2023-05-12 | DRG: 428 | Disposition: A | Discharge status: Home / Self Care | Payer: 59 | Attending: Neurological Surgery | Admitting: Neurological Surgery

## 2023-05-11 ENCOUNTER — Inpatient Hospital Stay (HOSPITAL_COMMUNITY): Payer: 59 | Admitting: Anesthesiology

## 2023-05-11 ENCOUNTER — Inpatient Hospital Stay (HOSPITAL_COMMUNITY): Payer: 59

## 2023-05-11 ENCOUNTER — Encounter (HOSPITAL_COMMUNITY): Payer: Self-pay | Admitting: Neurological Surgery

## 2023-05-11 DIAGNOSIS — Z981 Arthrodesis status: Secondary | ICD-10-CM

## 2023-05-11 DIAGNOSIS — J4489 Other specified chronic obstructive pulmonary disease: Secondary | ICD-10-CM | POA: Diagnosis present

## 2023-05-11 DIAGNOSIS — M4316 Spondylolisthesis, lumbar region: Secondary | ICD-10-CM | POA: Diagnosis present

## 2023-05-11 DIAGNOSIS — Z8051 Family history of malignant neoplasm of kidney: Secondary | ICD-10-CM

## 2023-05-11 DIAGNOSIS — Z880 Allergy status to penicillin: Secondary | ICD-10-CM

## 2023-05-11 DIAGNOSIS — Z800 Family history of malignant neoplasm of digestive organs: Secondary | ICD-10-CM

## 2023-05-11 DIAGNOSIS — J449 Chronic obstructive pulmonary disease, unspecified: Secondary | ICD-10-CM

## 2023-05-11 DIAGNOSIS — Z7982 Long term (current) use of aspirin: Secondary | ICD-10-CM | POA: Diagnosis not present

## 2023-05-11 DIAGNOSIS — Z825 Family history of asthma and other chronic lower respiratory diseases: Secondary | ICD-10-CM

## 2023-05-11 DIAGNOSIS — I129 Hypertensive chronic kidney disease with stage 1 through stage 4 chronic kidney disease, or unspecified chronic kidney disease: Secondary | ICD-10-CM | POA: Diagnosis present

## 2023-05-11 DIAGNOSIS — Z79899 Other long term (current) drug therapy: Secondary | ICD-10-CM

## 2023-05-11 DIAGNOSIS — Z8673 Personal history of transient ischemic attack (TIA), and cerebral infarction without residual deficits: Secondary | ICD-10-CM | POA: Diagnosis not present

## 2023-05-11 DIAGNOSIS — Z7902 Long term (current) use of antithrombotics/antiplatelets: Secondary | ICD-10-CM

## 2023-05-11 DIAGNOSIS — E1142 Type 2 diabetes mellitus with diabetic polyneuropathy: Secondary | ICD-10-CM | POA: Diagnosis present

## 2023-05-11 DIAGNOSIS — M5416 Radiculopathy, lumbar region: Secondary | ICD-10-CM | POA: Diagnosis present

## 2023-05-11 DIAGNOSIS — N189 Chronic kidney disease, unspecified: Secondary | ICD-10-CM | POA: Diagnosis present

## 2023-05-11 DIAGNOSIS — Z885 Allergy status to narcotic agent status: Secondary | ICD-10-CM | POA: Diagnosis not present

## 2023-05-11 DIAGNOSIS — Z808 Family history of malignant neoplasm of other organs or systems: Secondary | ICD-10-CM

## 2023-05-11 DIAGNOSIS — Z794 Long term (current) use of insulin: Secondary | ICD-10-CM | POA: Diagnosis not present

## 2023-05-11 DIAGNOSIS — E11319 Type 2 diabetes mellitus with unspecified diabetic retinopathy without macular edema: Secondary | ICD-10-CM | POA: Diagnosis present

## 2023-05-11 DIAGNOSIS — K219 Gastro-esophageal reflux disease without esophagitis: Secondary | ICD-10-CM | POA: Diagnosis present

## 2023-05-11 DIAGNOSIS — Z881 Allergy status to other antibiotic agents status: Secondary | ICD-10-CM

## 2023-05-11 DIAGNOSIS — Z803 Family history of malignant neoplasm of breast: Secondary | ICD-10-CM | POA: Diagnosis not present

## 2023-05-11 DIAGNOSIS — F17210 Nicotine dependence, cigarettes, uncomplicated: Secondary | ICD-10-CM | POA: Diagnosis present

## 2023-05-11 DIAGNOSIS — Z01818 Encounter for other preprocedural examination: Principal | ICD-10-CM

## 2023-05-11 DIAGNOSIS — Z888 Allergy status to other drugs, medicaments and biological substances status: Secondary | ICD-10-CM

## 2023-05-11 DIAGNOSIS — Z833 Family history of diabetes mellitus: Secondary | ICD-10-CM

## 2023-05-11 DIAGNOSIS — M797 Fibromyalgia: Secondary | ICD-10-CM | POA: Diagnosis present

## 2023-05-11 DIAGNOSIS — E1122 Type 2 diabetes mellitus with diabetic chronic kidney disease: Secondary | ICD-10-CM | POA: Diagnosis present

## 2023-05-11 DIAGNOSIS — Z823 Family history of stroke: Secondary | ICD-10-CM

## 2023-05-11 DIAGNOSIS — M48061 Spinal stenosis, lumbar region without neurogenic claudication: Principal | ICD-10-CM | POA: Diagnosis present

## 2023-05-11 DIAGNOSIS — Z801 Family history of malignant neoplasm of trachea, bronchus and lung: Secondary | ICD-10-CM

## 2023-05-11 DIAGNOSIS — E7800 Pure hypercholesterolemia, unspecified: Secondary | ICD-10-CM | POA: Diagnosis present

## 2023-05-11 DIAGNOSIS — G8929 Other chronic pain: Secondary | ICD-10-CM | POA: Diagnosis present

## 2023-05-11 LAB — POCT I-STAT, CHEM 8
BUN: 29 mg/dL — ABNORMAL HIGH (ref 8–23)
Calcium, Ion: 1.24 mmol/L (ref 1.15–1.40)
Chloride: 109 mmol/L (ref 98–111)
Creatinine, Ser: 1.2 mg/dL — ABNORMAL HIGH (ref 0.44–1.00)
Glucose, Bld: 205 mg/dL — ABNORMAL HIGH (ref 70–99)
HCT: 41 % (ref 36.0–46.0)
Hemoglobin: 13.9 g/dL (ref 12.0–15.0)
Potassium: 5.1 mmol/L (ref 3.5–5.1)
Sodium: 141 mmol/L (ref 135–145)
TCO2: 25 mmol/L (ref 22–32)

## 2023-05-11 LAB — GLUCOSE, CAPILLARY
Glucose-Capillary: 193 mg/dL — ABNORMAL HIGH (ref 70–99)
Glucose-Capillary: 206 mg/dL — ABNORMAL HIGH (ref 70–99)
Glucose-Capillary: 385 mg/dL — ABNORMAL HIGH (ref 70–99)
Glucose-Capillary: 381 mg/dL — ABNORMAL HIGH (ref 70–99)

## 2023-05-11 SURGERY — POSTERIOR LUMBAR FUSION 1 LEVEL
Anesthesia: General | Site: Spine Lumbar

## 2023-05-11 MED ORDER — ASPIRIN 81 MG PO TBEC
81.0000 mg | DELAYED_RELEASE_TABLET | Freq: Every day | ORAL | Status: DC
  Administered 2023-05-12: 81 mg via ORAL
  Filled 2023-05-11: qty 1

## 2023-05-11 MED ORDER — SUGAMMADEX SODIUM 200 MG/2ML IV SOLN
INTRAVENOUS | Status: DC | PRN
  Administered 2023-05-11: 137.8 mg via INTRAVENOUS

## 2023-05-11 MED ORDER — SENNA 8.6 MG PO TABS
1.0000 | ORAL_TABLET | Freq: Two times a day (BID) | ORAL | Status: DC
  Administered 2023-05-11 – 2023-05-12 (×2): 8.6 mg via ORAL
  Filled 2023-05-11 (×2): qty 1

## 2023-05-11 MED ORDER — PHENOL 1.4 % MT LIQD: 1.0000 | OROMUCOSAL | Status: DC | PRN

## 2023-05-11 MED ORDER — AMISULPRIDE (ANTIEMETIC) 5 MG/2ML IV SOLN: 10.0000 mg | Freq: Once | INTRAVENOUS | Status: DC | PRN

## 2023-05-11 MED ORDER — METHOCARBAMOL 500 MG PO TABS
500.0000 mg | ORAL_TABLET | Freq: Four times a day (QID) | ORAL | Status: DC | PRN
  Administered 2023-05-11 – 2023-05-12 (×4): 500 mg via ORAL
  Filled 2023-05-11 (×4): qty 1

## 2023-05-11 MED ORDER — BUPIVACAINE HCL (PF) 0.25 % IJ SOLN
INTRAMUSCULAR | Status: DC | PRN
  Administered 2023-05-11: 7 mL

## 2023-05-11 MED ORDER — KCL IN DEXTROSE-NACL 10-5-0.45 MEQ/L-%-% IV SOLN
INTRAVENOUS | Status: DC
  Filled 2023-05-11: qty 1000

## 2023-05-11 MED ORDER — CHLORHEXIDINE GLUCONATE CLOTH 2 % EX PADS: 6.0000 | MEDICATED_PAD | Freq: Once | CUTANEOUS | Status: DC

## 2023-05-11 MED ORDER — DEXAMETHASONE SODIUM PHOSPHATE 10 MG/ML IJ SOLN
INTRAMUSCULAR | Status: DC | PRN
  Administered 2023-05-11: 4 mg via INTRAVENOUS

## 2023-05-11 MED ORDER — ROCURONIUM BROMIDE 10 MG/ML (PF) SYRINGE
PREFILLED_SYRINGE | INTRAVENOUS | Status: DC | PRN
  Administered 2023-05-11: 40 mg via INTRAVENOUS

## 2023-05-11 MED ORDER — PROPOFOL 10 MG/ML IV BOLUS
INTRAVENOUS | Status: AC
  Filled 2023-05-11: qty 20

## 2023-05-11 MED ORDER — OXYCODONE HCL 5 MG PO TABS
5.0000 mg | ORAL_TABLET | ORAL | Status: DC | PRN
  Administered 2023-05-11 – 2023-05-12 (×7): 5 mg via ORAL
  Filled 2023-05-11 (×6): qty 1

## 2023-05-11 MED ORDER — SODIUM CHLORIDE 0.9 % IV SOLN: 250.0000 mL | INTRAVENOUS | Status: DC

## 2023-05-11 MED ORDER — CELECOXIB 200 MG PO CAPS
200.0000 mg | ORAL_CAPSULE | Freq: Two times a day (BID) | ORAL | Status: DC
  Administered 2023-05-11 – 2023-05-12 (×3): 200 mg via ORAL
  Filled 2023-05-11 (×3): qty 1

## 2023-05-11 MED ORDER — LIDOCAINE 2% (20 MG/ML) 5 ML SYRINGE
INTRAMUSCULAR | Status: DC | PRN
  Administered 2023-05-11: 40 mg via INTRAVENOUS

## 2023-05-11 MED ORDER — FENTANYL CITRATE (PF) 250 MCG/5ML IJ SOLN
INTRAMUSCULAR | Status: AC
  Filled 2023-05-11: qty 5

## 2023-05-11 MED ORDER — SODIUM CHLORIDE 0.9% FLUSH
3.0000 mL | INTRAVENOUS | Status: DC | PRN
Start: 2023-05-11 — End: 2023-05-12

## 2023-05-11 MED ORDER — MENTHOL 3 MG MT LOZG: 1.0000 | LOZENGE | OROMUCOSAL | Status: DC | PRN

## 2023-05-11 MED ORDER — HYDROCHLOROTHIAZIDE 12.5 MG PO TABS
12.5000 mg | ORAL_TABLET | Freq: Every day | ORAL | Status: DC
  Administered 2023-05-11 – 2023-05-12 (×2): 12.5 mg via ORAL
  Filled 2023-05-11 (×2): qty 1

## 2023-05-11 MED ORDER — AMLODIPINE BESYLATE 5 MG PO TABS
5.0000 mg | ORAL_TABLET | Freq: Every day | ORAL | Status: DC
  Administered 2023-05-11 – 2023-05-12 (×2): 5 mg via ORAL
  Filled 2023-05-11 (×2): qty 1

## 2023-05-11 MED ORDER — BUPIVACAINE HCL (PF) 0.25 % IJ SOLN
INTRAMUSCULAR | Status: AC
  Filled 2023-05-11: qty 30

## 2023-05-11 MED ORDER — FENTANYL CITRATE (PF) 100 MCG/2ML IJ SOLN
25.0000 ug | INTRAMUSCULAR | Status: DC | PRN
  Administered 2023-05-11 (×3): 25 ug via INTRAVENOUS

## 2023-05-11 MED ORDER — METHOCARBAMOL 1000 MG/10ML IJ SOLN: 500.0000 mg | Freq: Four times a day (QID) | INTRAMUSCULAR | Status: DC | PRN

## 2023-05-11 MED ORDER — ONDANSETRON HCL 4 MG PO TABS: 4.0000 mg | ORAL_TABLET | Freq: Four times a day (QID) | ORAL | Status: DC | PRN

## 2023-05-11 MED ORDER — FENTANYL CITRATE (PF) 100 MCG/2ML IJ SOLN
INTRAMUSCULAR | Status: AC
  Filled 2023-05-11: qty 2

## 2023-05-11 MED ORDER — GABAPENTIN 300 MG PO CAPS
300.0000 mg | ORAL_CAPSULE | ORAL | Status: AC
  Administered 2023-05-11: 300 mg via ORAL
  Filled 2023-05-11: qty 1

## 2023-05-11 MED ORDER — HYDROMORPHONE HCL 1 MG/ML IJ SOLN: 0.5000 mg | INTRAMUSCULAR | Status: DC | PRN

## 2023-05-11 MED ORDER — ONDANSETRON HCL 4 MG/2ML IJ SOLN
INTRAMUSCULAR | Status: DC | PRN
  Administered 2023-05-11: 4 mg via INTRAVENOUS

## 2023-05-11 MED ORDER — THROMBIN 20000 UNITS EX SOLR
CUTANEOUS | Status: AC
  Filled 2023-05-11: qty 20000

## 2023-05-11 MED ORDER — PROPOFOL 10 MG/ML IV BOLUS
INTRAVENOUS | Status: DC | PRN
  Administered 2023-05-11: 120 mg via INTRAVENOUS

## 2023-05-11 MED ORDER — ACETAMINOPHEN 500 MG PO TABS
1000.0000 mg | ORAL_TABLET | Freq: Four times a day (QID) | ORAL | Status: AC
  Administered 2023-05-11 – 2023-05-12 (×4): 1000 mg via ORAL
  Filled 2023-05-11 (×4): qty 2

## 2023-05-11 MED ORDER — INSULIN ASPART 100 UNIT/ML IJ SOLN
0.0000 [IU] | Freq: Every day | INTRAMUSCULAR | Status: DC
Start: 2023-05-11 — End: 2023-05-12
  Administered 2023-05-11: 5 [IU] via SUBCUTANEOUS

## 2023-05-11 MED ORDER — THROMBIN 20000 UNITS EX SOLR: CUTANEOUS | Status: DC | PRN

## 2023-05-11 MED ORDER — FENTANYL CITRATE (PF) 250 MCG/5ML IJ SOLN
INTRAMUSCULAR | Status: DC | PRN
  Administered 2023-05-11 (×3): 50 ug via INTRAVENOUS
  Administered 2023-05-11: 100 ug via INTRAVENOUS

## 2023-05-11 MED ORDER — CEFAZOLIN SODIUM-DEXTROSE 2-4 GM/100ML-% IV SOLN: 2.0000 g | Freq: Three times a day (TID) | INTRAVENOUS | Status: DC

## 2023-05-11 MED ORDER — ONDANSETRON HCL 4 MG/2ML IJ SOLN: 4.0000 mg | Freq: Four times a day (QID) | INTRAMUSCULAR | Status: DC | PRN

## 2023-05-11 MED ORDER — INSULIN ASPART 100 UNIT/ML IJ SOLN
0.0000 [IU] | Freq: Three times a day (TID) | INTRAMUSCULAR | Status: DC
  Administered 2023-05-11: 15 [IU] via SUBCUTANEOUS

## 2023-05-11 MED ORDER — 0.9 % SODIUM CHLORIDE (POUR BTL) OPTIME
TOPICAL | Status: DC | PRN
  Administered 2023-05-11: 1000 mL

## 2023-05-11 MED ORDER — ACETAMINOPHEN 500 MG PO TABS
1000.0000 mg | ORAL_TABLET | ORAL | Status: AC
  Administered 2023-05-11: 1000 mg via ORAL
  Filled 2023-05-11: qty 2

## 2023-05-11 MED ORDER — VANCOMYCIN HCL IN DEXTROSE 1-5 GM/200ML-% IV SOLN
1000.0000 mg | INTRAVENOUS | Status: AC
  Administered 2023-05-11: 1000 mg via INTRAVENOUS
  Filled 2023-05-11: qty 200

## 2023-05-11 MED ORDER — MIDAZOLAM HCL 2 MG/2ML IJ SOLN
INTRAMUSCULAR | Status: AC
  Filled 2023-05-11: qty 2

## 2023-05-11 MED ORDER — THROMBIN 5000 UNITS EX SOLR
CUTANEOUS | Status: AC
  Filled 2023-05-11: qty 5000

## 2023-05-11 MED ORDER — SURGIPHOR WOUND IRRIGATION SYSTEM - OPTIME: TOPICAL | Status: DC | PRN

## 2023-05-11 MED ORDER — MIDAZOLAM HCL 2 MG/2ML IJ SOLN
INTRAMUSCULAR | Status: DC | PRN
  Administered 2023-05-11: 2 mg via INTRAVENOUS

## 2023-05-11 MED ORDER — PHENYLEPHRINE 80 MCG/ML (10ML) SYRINGE FOR IV PUSH (FOR BLOOD PRESSURE SUPPORT)
PREFILLED_SYRINGE | INTRAVENOUS | Status: DC | PRN
  Administered 2023-05-11 (×2): 80 ug via INTRAVENOUS

## 2023-05-11 MED ORDER — ORAL CARE MOUTH RINSE: 15.0000 mL | Freq: Once | OROMUCOSAL | Status: AC

## 2023-05-11 MED ORDER — THROMBIN 5000 UNITS EX SOLR: OROMUCOSAL | Status: DC | PRN

## 2023-05-11 MED ORDER — CHLORHEXIDINE GLUCONATE 0.12 % MT SOLN
15.0000 mL | Freq: Once | OROMUCOSAL | Status: AC
  Administered 2023-05-11: 15 mL via OROMUCOSAL
  Filled 2023-05-11: qty 15

## 2023-05-11 MED ORDER — LEVETIRACETAM 500 MG PO TABS
500.0000 mg | ORAL_TABLET | Freq: Two times a day (BID) | ORAL | Status: DC
  Administered 2023-05-11 – 2023-05-12 (×3): 500 mg via ORAL
  Filled 2023-05-11 (×3): qty 1

## 2023-05-11 MED ORDER — LISINOPRIL 20 MG PO TABS
20.0000 mg | ORAL_TABLET | Freq: Every day | ORAL | Status: DC
  Administered 2023-05-11 – 2023-05-12 (×2): 20 mg via ORAL
  Filled 2023-05-11 (×2): qty 1

## 2023-05-11 MED ORDER — LACTATED RINGERS IV SOLN: INTRAVENOUS | Status: DC

## 2023-05-11 MED ORDER — INSULIN ASPART 100 UNIT/ML IJ SOLN
0.0000 [IU] | Freq: Three times a day (TID) | INTRAMUSCULAR | Status: DC
Start: 2023-05-12 — End: 2023-05-12
  Administered 2023-05-12: 15 [IU] via SUBCUTANEOUS

## 2023-05-11 MED ORDER — PHENYLEPHRINE HCL-NACL 20-0.9 MG/250ML-% IV SOLN
INTRAVENOUS | Status: DC | PRN
  Administered 2023-05-11: 80 ug/min via INTRAVENOUS

## 2023-05-11 MED ORDER — SODIUM CHLORIDE 0.9% FLUSH
3.0000 mL | Freq: Two times a day (BID) | INTRAVENOUS | Status: DC
  Administered 2023-05-11: 3 mL via INTRAVENOUS

## 2023-05-11 MED ORDER — OXYCODONE HCL 5 MG PO TABS
ORAL_TABLET | ORAL | Status: AC
  Filled 2023-05-11: qty 1

## 2023-05-11 MED ORDER — INSULIN ASPART 100 UNIT/ML IJ SOLN
0.0000 [IU] | INTRAMUSCULAR | Status: DC | PRN
  Administered 2023-05-11: 2 [IU] via SUBCUTANEOUS

## 2023-05-11 SURGICAL SUPPLY — 66 items
ADH SKN CLS APL DERMABOND .7 (GAUZE/BANDAGES/DRESSINGS) ×1
APL SKNCLS STERI-STRIP NONHPOA (GAUZE/BANDAGES/DRESSINGS) ×1
BAG COUNTER SPONGE SURGICOUNT (BAG) ×1 IMPLANT
BAG SPNG CNTER NS LX DISP (BAG) ×1
BASKET BONE COLLECTION (BASKET) ×1 IMPLANT
BENZOIN TINCTURE PRP APPL 2/3 (GAUZE/BANDAGES/DRESSINGS) ×1 IMPLANT
BLADE BONE MILL MEDIUM (MISCELLANEOUS) ×1 IMPLANT
BLADE CLIPPER SURG (BLADE) IMPLANT
BUR CARBIDE MATCH 3.0 (BURR) ×1 IMPLANT
CAGE COROENT 11X9X23-8 (Cage) IMPLANT
CANISTER SUCT 3000ML PPV (MISCELLANEOUS) ×1 IMPLANT
CNTNR URN SCR LID CUP LEK RST (MISCELLANEOUS) ×1 IMPLANT
CONT SPEC 4OZ STRL OR WHT (MISCELLANEOUS) ×1
COVER BACK TABLE 60X90IN (DRAPES) ×1 IMPLANT
DERMABOND ADVANCED .7 DNX12 (GAUZE/BANDAGES/DRESSINGS) ×1 IMPLANT
DRAPE C-ARM 42X72 X-RAY (DRAPES) ×2 IMPLANT
DRAPE C-ARMOR (DRAPES) ×1 IMPLANT
DRAPE LAPAROTOMY 100X72X124 (DRAPES) ×1 IMPLANT
DRAPE SURG 17X23 STRL (DRAPES) ×1 IMPLANT
DRSG OPSITE POSTOP 4X6 (GAUZE/BANDAGES/DRESSINGS) IMPLANT
DURAPREP 26ML APPLICATOR (WOUND CARE) ×1 IMPLANT
ELECT REM PT RETURN 9FT ADLT (ELECTROSURGICAL) ×1
ELECTRODE REM PT RTRN 9FT ADLT (ELECTROSURGICAL) ×1 IMPLANT
EVACUATOR 1/8 PVC DRAIN (DRAIN) ×1 IMPLANT
GAUZE 4X4 16PLY ~~LOC~~+RFID DBL (SPONGE) IMPLANT
GLOVE BIO SURGEON STRL SZ7 (GLOVE) IMPLANT
GLOVE BIO SURGEON STRL SZ8 (GLOVE) ×2 IMPLANT
GLOVE BIOGEL PI IND STRL 7.0 (GLOVE) IMPLANT
GOWN STRL REUS W/ TWL LRG LVL3 (GOWN DISPOSABLE) IMPLANT
GOWN STRL REUS W/ TWL XL LVL3 (GOWN DISPOSABLE) ×2 IMPLANT
GOWN STRL REUS W/TWL 2XL LVL3 (GOWN DISPOSABLE) IMPLANT
GOWN STRL REUS W/TWL LRG LVL3 (GOWN DISPOSABLE)
GOWN STRL REUS W/TWL XL LVL3 (GOWN DISPOSABLE) ×2
GRAFT BN 5X1XSPNE CVD POST DBM (Bone Implant) IMPLANT
GRAFT BONE MAGNIFUSE 1X5CM (Bone Implant) ×1 IMPLANT
GRAFT TRIN ELITE MED MUSC TRAN (Graft) IMPLANT
HEMOSTAT POWDER KIT SURGIFOAM (HEMOSTASIS) ×1 IMPLANT
KIT BASIN OR (CUSTOM PROCEDURE TRAY) ×1 IMPLANT
KIT GRAFTMAG DEL NEURO DISP (NEUROSURGERY SUPPLIES) IMPLANT
KIT POSITION SURG JACKSON T1 (MISCELLANEOUS) ×1 IMPLANT
KIT TURNOVER KIT B (KITS) ×1 IMPLANT
MILL BONE PREP (MISCELLANEOUS) ×1 IMPLANT
NDL HYPO 25X1 1.5 SAFETY (NEEDLE) ×1 IMPLANT
NEEDLE HYPO 25X1 1.5 SAFETY (NEEDLE) ×1 IMPLANT
NS IRRIG 1000ML POUR BTL (IV SOLUTION) ×1 IMPLANT
PACK LAMINECTOMY NEURO (CUSTOM PROCEDURE TRAY) ×1 IMPLANT
PAD ARMBOARD 7.5X6 YLW CONV (MISCELLANEOUS) ×3 IMPLANT
ROD 60MM LUMBAR (Rod) IMPLANT
SCREW LOCK (Screw) ×4 IMPLANT
SCREW LOCK FXNS SPNE MAS PL (Screw) IMPLANT
SCREW SHANK 6.5X40 (Screw) ×2 IMPLANT
SCREW SHANK 6.5X40 NS LF (Screw) IMPLANT
SCREW SHANK PLIF MAS 5.5X40 (Screw) IMPLANT
SCREW TULIP 5.5 (Screw) IMPLANT
SOLUTION IRRIG SURGIPHOR (IV SOLUTION) ×1 IMPLANT
SPONGE SURGIFOAM ABS GEL 100 (HEMOSTASIS) ×1 IMPLANT
SPONGE T-LAP 4X18 ~~LOC~~+RFID (SPONGE) IMPLANT
STRIP CLOSURE SKIN 1/2X4 (GAUZE/BANDAGES/DRESSINGS) ×2 IMPLANT
SUT VIC AB 0 CT1 18XCR BRD8 (SUTURE) ×1 IMPLANT
SUT VIC AB 0 CT1 8-18 (SUTURE) ×1
SUT VIC AB 2-0 CP2 18 (SUTURE) ×1 IMPLANT
SUT VIC AB 3-0 SH 8-18 (SUTURE) ×2 IMPLANT
TOWEL GREEN STERILE (TOWEL DISPOSABLE) ×1 IMPLANT
TOWEL GREEN STERILE FF (TOWEL DISPOSABLE) ×1 IMPLANT
TRAY FOLEY MTR SLVR 16FR STAT (SET/KITS/TRAYS/PACK) ×1 IMPLANT
WATER STERILE IRR 1000ML POUR (IV SOLUTION) ×1 IMPLANT

## 2023-05-11 NOTE — Transfer of Care (Signed)
Immediate Anesthesia Transfer of Care Note  Patient: Paula Hamilton  Procedure(s) Performed: LUMBAR THREE-FOUR POSTERIOR LUMBAR INTERBODY FUSION (Spine Lumbar)  Patient Location: PACU  Anesthesia Type:General  Level of Consciousness: awake  Airway & Oxygen Therapy: Patient Spontanous Breathing and Patient connected to face mask oxygen  Post-op Assessment: Post -op Vital signs reviewed and stable  Post vital signs: Reviewed and stable  Last Vitals:  Vitals Value Taken Time  BP 132/58 05/11/23 1103  Temp    Pulse 74 05/11/23 1107  Resp 18 05/11/23 1107  SpO2 99 % 05/11/23 1107  Vitals shown include unfiled device data.  Last Pain:  Vitals:   05/11/23 0714  TempSrc:   PainSc: 8       Patients Stated Pain Goal: 0 (05/11/23 0714)  Complications: There were no known notable events for this encounter.

## 2023-05-11 NOTE — Op Note (Signed)
05/11/2023  11:00 AM  PATIENT:  Paula Hamilton  72 y.o. 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 Decompressive lumbar laminectomy, hemi facetectomy and foraminotomies L3-4 requiring more work than would be required for a simple exposure of the disk for PLIF in order to adequately decompress the neural elements and address the spinal stenosis 2. Posterior lumbar interbody fusion L3-4 using peek interbody cages packed with morcellized allograft and autograft  3. Posterior fixation L3-4 using NuVasive cortical pedicle screws.  4. Intertransverse arthrodesis L2-3 L3-4 right using morcellized autograft and allograft. 5.  Exploration of fusion L2-3 L3-4 L4-5 to confirm arthrodesis status 6.  Removal and replacement of instrumentation L3 and L4  SURGEON:  Marikay Alar, MD  ASSISTANTS: Verlin Dike, FNP  ANESTHESIA:  General  EBL: 50 ml  Total I/O In: -  Out: 50 [Blood:50]  BLOOD ADMINISTERED:none  DRAINS: none   INDICATION FOR PROCEDURE: This patient presented with back pain with leg pain. Imaging revealed central stenosis L3-4.  CT scan of the abdomen done earlier in the year suggested solid arthrodesis at L2-3 and L4-5.  The patient tried a reasonable attempt at conservative medical measures without relief. I recommended decompression and instrumented fusion to address the stenosis as well as the segmental  instability.  Patient understood the risks, benefits, and alternatives and potential outcomes and wished to proceed.  Findings at surgery: The L2 and L5 screws appeared to have good purchase.  Both L4 screws were somewhat loose and the left L3 screw was mildly loose.  Therefore I explored the fusion L2-3 and L4-5.  I felt she had a solid arthrodesis at L4-5 but I felt like there might have been abnormal motion at L2-3.  For I  decided to move forward with posterior lateral arthrodesis at L2-3 in addition to L3-4.  PROCEDURE DETAILS:  The patient was brought to the operating room. After induction of generalized endotracheal anesthesia the patient was rolled into the prone position on chest rolls and all pressure points were padded. The patient's lumbar region was cleaned and then prepped with DuraPrep and draped in the usual sterile fashion. Anesthesia was injected and then a dorsal midline incision was made and carried down to the lumbosacral fascia. The fascia was opened and the paraspinous musculature was taken down in a subperiosteal fashion to expose the previously placed instrumentation at L2-3 and L4-5. A self-retaining retractor was placed. Intraoperative fluoroscopy confirmed my level, and I started with removal of the locking caps and the rods from both levels.  The screws at L4 were both somewhat loose, and the left L3 screw appeared to be somewhat loose.  Therefore the screws were removed.  Pulled on the L5 screws and the entire L4-5 construct moved in unison.  I pulled on the L3 screw and there seem to be motion at L2-3 instead of all screws moving and Unasyn.  This suggested pseudoarthrosis at L2-3.   I then turned my attention to the decompression and complete lumbar laminectomies, hemi- facetectomies, and foraminotomies were performed at L3-4.  My nurse practitioner was directly involved in the decompression and exposure of the neural elements. the patient had significant spinal stenosis and this required more work than would be required for a simple exposure of the disc for posterior lumbar interbody fusion which would only require a limited laminotomy. Much more generous decompression and generous foraminotomy was undertaken  in order to adequately decompress the neural elements and address the patient's leg pain. The yellow ligament was removed to expose the underlying dura and nerve roots, and generous  foraminotomies were performed to adequately decompress the neural elements. Both the exiting and traversing nerve roots were decompressed on both sides until a coronary dilator passed easily along the nerve roots. Once the decompression was complete, I turned my attention to the posterior lower lumbar interbody fusion. The epidural venous vasculature was coagulated and cut sharply. Disc space was incised and the initial discectomy was performed with pituitary rongeurs. The disc space was distracted with sequential distractors to a height of 11 mm. We then used a series of scrapers and shavers to prepare the endplates for fusion. The midline was prepared with Epstein curettes. Once the complete discectomy was finished, we packed an appropriate sized interbody cage with local autograft and morcellized allograft, gently retracted the nerve root, and tapped the cage into position at L3-4.  The midline between the cages was packed with morselized autograft and allograft.   We then turned our attention to the placement of the pedicle screws.  The old left L3 pedicle screw was a 5.0 x 35 mm screw this was replaced with a 5.5 x 40 mm screw.  The old L4 pedicle screws were 5.0 x 30 mm screws and these were replaced with 6.5 x 40 mm screws.  These appeared to have excellent purchase.  My nurse practitioner assisted in placement of the pedicle screws.  We then decorticated the transverse processes and laid a mixture of morcellized autograft and allograft out over these to perform intertransverse arthrodesis at 2 3 and L3-4 on the right. We then placed lordotic rods into the multiaxial screw heads of the pedicle screws of L3 and L4 and locked these in position with the locking caps and anti-torque device. We then checked our construct with AP and lateral fluoroscopy. Irrigated with copious amounts of 0.5% povidone iodine solution followed by saline solution. Inspected the nerve roots once again to assure adequate  decompression, lined to the dura with Gelfoam,  and then we closed the muscle and the fascia with 0 Vicryl. Closed the subcutaneous tissues with 2-0 Vicryl and subcuticular tissues with 3-0 Vicryl. The skin was closed with benzoin and Steri-Strips. Dressing was then applied, the patient was awakened from general anesthesia and transported to the recovery room in stable condition. At the end of the procedure all sponge, needle and instrument counts were correct.   PLAN OF CARE: admit to inpatient  PATIENT DISPOSITION:  PACU - hemodynamically stable.   Delay start of Pharmacological VTE agent (>24hrs) due to surgical blood loss or risk of bleeding:  yes

## 2023-05-11 NOTE — Progress Notes (Signed)
Orthopedic Tech Progress Note Patient Details:  Paula Hamilton 1951-05-12 [id number] LSO brace was delivered to bedside.  Ortho Devices Type of Ortho Device: Lumbar corsett Ortho Device/Splint Interventions: Ordered, Adjustment   Post Interventions Instructions Provided: Adjustment of device  Roshawnda Pecora E Quanta Roher 05/11/2023, 12:46 PM

## 2023-05-11 NOTE — Anesthesia Procedure Notes (Signed)
Procedure Name: Intubation Date/Time: 05/11/2023 8:44 AM  Performed by: Hessie Diener, CRNAPre-anesthesia Checklist: Patient identified, Emergency Drugs available, Suction available and Patient being monitored Patient Re-evaluated:Patient Re-evaluated prior to induction Oxygen Delivery Method: Circle System Utilized Preoxygenation: Pre-oxygenation with 100% oxygen Induction Type: IV induction Ventilation: Mask ventilation without difficulty Laryngoscope Size: Mac and 3 Grade View: Grade I Tube type: Oral Tube size: 7.0 mm Number of attempts: 1 Airway Equipment and Method: Stylet and Oral airway Placement Confirmation: ETT inserted through vocal cords under direct vision, positive ETCO2 and breath sounds checked- equal and bilateral Secured at: 21 cm Tube secured with: Tape Dental Injury: Teeth and Oropharynx as per pre-operative assessment

## 2023-05-11 NOTE — H&P (Signed)
Subjective: Patient is a 72 y.o. female admitted for PLIF. Onset of symptoms was several months ago, gradually worsening since that time.  The pain is rated severe, and is located at the across the lower back and radiates to legs. The pain is described as aching and occurs all day. The symptoms have been progressive. Symptoms are exacerbated by exercise, standing, and walking for more than a few minutes. 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   Past Medical History:  Diagnosis Date   3rd cranial nerve palsy, right    just start 03/13/2017   Ambulates with cane    straight   Anxiety    Arthritis    listhesis, instability   Asthma    Bronchitis 02/2023   just finished Z pack and is taking cough syrup with Codiene for cough   Chronic kidney disease    Chronic pain    COPD (chronic obstructive pulmonary disease) (HCC)    Depression    Diabetes mellitus without complication (HCC)    type 2   Diabetic retinopathy (HCC)    Dysphagia    Fibromyalgia    GERD (gastroesophageal reflux disease)    Headache(784.0)    Heart murmur    mentioned yrs. ago, never mentioned again.    History of colon polyps    Hx of migraines    Hypercholesterolemia    Hypertension    Memory loss    mild since stroke   Mental disorder    bipolar disorder - patient denies this dx as of 05/04/23.   Neuropathy    legs and feet   Pneumonia    x 1 yrs ago   PONV (postoperative nausea and vomiting)    has had other surgeries with no problems   Seizures (HCC) 09/2018   w/stroke in 2020   Shortness of breath    Stroke (HCC) 09/15/2018   multiple   Walker as ambulation aid    rollator walker    Past Surgical History:  Procedure Laterality Date   ABDOMINAL HYSTERECTOMY     APPENDECTOMY     age 63   BACK SURGERY     lower back 4 time, tens surgery too   CERVICAL FUSION     COLONOSCOPY W/ BIOPSIES AND POLYPECTOMY  11/21/2014   Colonic polyp status post polypectomy. benign. Mild sigmoid  diverticulosis.   ESOPHAGOGASTRODUODENOSCOPY  02/26/2016   Moderate gastritis. Otherwise normal EGD    EYE SURGERY     laser surgery on left eye 2023; cataracts removed bilateral 2023   MAXIMUM ACCESS (MAS)POSTERIOR LUMBAR INTERBODY FUSION (PLIF) 1 LEVEL N/A 04/12/2013   Procedure: Lumba four-five maximum access surgery Posterior Lumbar Interbody Fusion;  Surgeon: Tia Alert, MD;  Location: [facility];  Service: Neurosurgery;  Laterality: N/A;  Lumba four-five maximum access surgery Posterior Lumbar Interbody Fusion   SPINAL CORD STIMULATOR INSERTION N/A 06/14/2014   Procedure: LUMBAR SPINAL CORD STIMULATOR INSERTION;  Surgeon: Gwynne Edinger, MD;  Location: [facility];  Service: Neurosurgery;  Laterality: N/A;   SPINAL CORD STIMULATOR REMOVAL N/A 04/12/2017   Procedure: LUMBAR SPINAL CORD STIMULATOR REMOVAL;  Surgeon: Odette Fraction, MD;  Location: [facility];  Service: Neurosurgery;  Laterality: N/A;   TUBAL LIGATION  1984    Prior to Admission medications   Medication Sig Start Date End Date Taking? Authorizing Provider  acetaminophen (TYLENOL) 500 MG tablet Take 1,000 mg by mouth every 6 (six) hours as needed for mild pain.   Yes [provider]  amLODipine (NORVASC) 5 MG tablet Take 5 mg by mouth daily. 04/19/23  Yes [provider]  aspirin EC 81 MG tablet Take 81 mg by mouth daily. Swallow whole.   Yes [provider]  clopidogrel (PLAVIX) 75 MG tablet Take 75 mg by mouth daily. 08/02/19  Yes [provider]  diphenhydrAMINE (BENADRYL) 25 mg capsule Take 25 mg by mouth at bedtime.   Yes [provider]  hydrochlorothiazide (MICROZIDE) 12.5 MG capsule Take 12.5 mg by mouth daily. 04/05/23  Yes [provider]  Insulin Degludec-Liraglutide (XULTOPHY) 100-3.6 UNIT-MG/ML SOPN Inject 36 Units into the skin in the morning.   Yes [provider]  levETIRAcetam (KEPPRA) 500 MG tablet Take 1 tablet (500 mg total) by mouth 2 (two) times  daily. 11/20/19  Yes Jaffe, Adam R, DO  lisinopril (ZESTRIL) 20 MG tablet Take 20 mg by mouth daily. 07/31/21  Yes [provider]  meloxicam (MOBIC) 15 MG tablet Take 15 mg by mouth daily as needed for pain. 11/08/22  Yes [provider]  diphenoxylate-atropine (LOMOTIL) 2.5-0.025 MG tablet Take 1 tablet by mouth 3 (three) times daily as needed for diarrhea or loose stools. Patient not taking: Reported on 04/27/2023 07/11/18   Lynann Bologna, MD   Allergies  Allergen Reactions   Codeine Rash and Swelling   Penicillins Rash    PATIENT HAS HAD A PCN REACTION WITH IMMEDIATE RASH, FACIAL/TONGUE/THROAT SWELLING, SOB, OR LIGHTHEADEDNESS WITH HYPOTENSION:  #  #  #  YES  #  #  #   Has patient had a PCN reaction causing severe rash involving mucus membranes or skin necrosis: No Has patient had a PCN reaction that required hospitalization: No Has patient had a PCN reaction occurring within the last 10 years: No    Pregabalin Other (See Comments)    Suicidal thoughts, extreme depression   Atorvastatin    Doxycycline Swelling   Metformin Other (See Comments)    Profound muscle weakness whren mixed w/actos or januvia   Erythromycin Base Nausea And Vomiting    STOMACH PAIN   Morphine And Codeine Nausea And Vomiting    Has taken in pill form w/o side effects    Social History   Tobacco Use   Smoking status: Every Day    Current packs/day: 0.00    Average packs/day: 1 pack/day for 30.0 years (30.0 ttl pk-yrs)    Types: Cigarettes    Start date: 05/21/1981    Last attempt to quit: 05/22/2011    Years since quitting: 11.9   Smokeless tobacco: Never   Tobacco comments:    Started smoking again in back in 2012 per patient on 04/29/23.  Substance Use Topics   Alcohol use: Yes    Comment: occasional beer    Family History  Problem Relation Age of Onset   Breast cancer Mother    Lung cancer Mother    COPD Mother    Melanoma Father    Stroke Sister        in 2   Diabetes Sister     Stroke Brother        in 2   Diabetes Brother    Diabetes Brother    Stomach cancer Paternal Aunt    Colon cancer Paternal Uncle    Kidney cancer Paternal Uncle    Esophageal cancer Neg Hx      Review of Systems  Positive ROS: neg  All other systems have been reviewed and were otherwise negative with  the exception of those mentioned in the HPI and as above.  Objective: Vital signs in last 24 hours: Temp:  [97.9 F (36.6 C)] 97.9 F (36.6 C) (11/06 0657) Pulse Rate:  [71] 71 (11/06 0657) Resp:  [16] 16 (11/06 0657) BP: (147)/(66) 147/66 (11/06 0657) SpO2:  [97 %] 97 % (11/06 0657) Weight:  [68.9 kg] 68.9 kg (11/06 0657)  General Appearance: Alert, cooperative, no distress, appears stated age Head: Normocephalic, without obvious abnormality, atraumatic Eyes: PERRL, conjunctiva/corneas clear, EOM's intact    Neck: Supple, symmetrical, trachea midline Back: Symmetric, no curvature, ROM normal, no CVA tenderness Lungs:  respirations unlabored Heart: Regular rate and rhythm Abdomen: Soft, non-tender Extremities: Extremities normal, atraumatic, no cyanosis or edema Pulses: 2+ and symmetric all extremities Skin: Skin color, texture, turgor normal, no rashes or lesions  NEUROLOGIC:   Mental status: Alert and oriented x4,  no aphasia, good attention span, fund of knowledge, and memory Motor Exam - grossly normal Sensory Exam - grossly normal Reflexes: 1= Coordination - grossly normal Gait - grossly normal Balance - grossly normal Cranial Nerves: I: smell Not tested  II: visual acuity  OS: nl    OD: nl  II: visual fields Full to confrontation  II: pupils Equal, round, reactive to light  III,VII: ptosis None  III,IV,VI: extraocular muscles  Full ROM  V: mastication Normal  V: facial light touch sensation  Normal  V,VII: corneal reflex  Present  VII: facial muscle function - upper  Normal  VII: facial muscle function - lower Normal  VIII: hearing Not tested  IX:  soft palate elevation  Normal  IX,X: gag reflex Present  XI: trapezius strength  5/5  XI: sternocleidomastoid strength 5/5  XI: neck flexion strength  5/5  XII: tongue strength  Normal    Data Review Lab Results  Component Value Date   WBC 11.4 (H) 05/04/2023   HGB 13.9 05/11/2023   HCT 41.0 05/11/2023   MCV 97.9 05/04/2023   PLT 360 05/04/2023   Lab Results  Component Value Date   NA 141 05/11/2023   K 5.1 05/11/2023   CL 109 05/11/2023   CO2 25 05/04/2023   BUN 29 (H) 05/11/2023   CREATININE 1.20 (H) 05/11/2023   GLUCOSE 205 (H) 05/11/2023   Lab Results  Component Value Date   INR 0.9 05/04/2023    Assessment/Plan:  Estimated body mass index is 26.09 kg/m as calculated from the following:   Height as of this encounter: 5\' 4"  (1.626 m).   Weight as of this encounter: 68.9 kg. Patient admitted for PLIF L3-4. Patient has failed a reasonable attempt at conservative therapy.  I explained the condition and procedure to the patient and answered any questions.  Patient wishes to proceed with procedure as planned. Understands risks/ benefits and typical outcomes of procedure.   Tia Alert 05/11/2023 7:50 AM

## 2023-05-12 ENCOUNTER — Other Ambulatory Visit (HOSPITAL_COMMUNITY): Payer: Self-pay

## 2023-05-12 LAB — GLUCOSE, CAPILLARY: Glucose-Capillary: 384 mg/dL — ABNORMAL HIGH (ref 70–99)

## 2023-05-12 MED ORDER — METHOCARBAMOL 500 MG PO TABS
500.0000 mg | ORAL_TABLET | Freq: Four times a day (QID) | ORAL | 0 refills | Status: DC | PRN
  Filled 2023-05-12: qty 45, 12d supply, fill #0

## 2023-05-12 MED ORDER — OXYCODONE-ACETAMINOPHEN 5-325 MG PO TABS
1.0000 | ORAL_TABLET | ORAL | 0 refills | Status: DC | PRN
  Filled 2023-05-12: qty 30, 5d supply, fill #0

## 2023-05-12 NOTE — Evaluation (Signed)
Physical Therapy Evaluation  Patient Details Name: Paula Hamilton MRN: [id number] DOB: 1951-07-05 Today's Date: 05/12/2023  History of Present Illness  Paula Hamilton is a 72 yo female who underwent PLIF L3-4. PMHx: anxiety, arthritis, asthma, CKD, COPD, DM, fibromyalgia, GERD, HTN, seizures, CVA, hypercholesterolemia   Clinical Impression  Pt admitted with above diagnosis. At the time of PT eval, pt was able to demonstrate transfers and ambulation with gross min guard assist and RW for support. Pt was educated on precautions, brace application/wearing schedule, appropriate activity progression, and car transfer. Pt currently with functional limitations due to the deficits listed below (see PT Problem List). Pt will benefit from skilled PT to increase their independence and safety with mobility to allow discharge to the venue listed below.          If plan is discharge home, recommend the following: A little help with walking and/or transfers;A little help with bathing/dressing/bathroom;Assistance with cooking/housework;Assist for transportation;Help with stairs or ramp for entrance   Can travel by private vehicle        Equipment Recommendations Rolling walker (2 wheels)  Recommendations for Other Services       Functional Status Assessment Patient has had a recent decline in their functional status and demonstrates the ability to make significant improvements in function in a reasonable and predictable amount of time.     Precautions / Restrictions Precautions Precautions: Fall;Back Precaution Booklet Issued: Yes (comment) Required Braces or Orthoses: Spinal Brace Spinal Brace: Lumbar corset Restrictions Weight Bearing Restrictions: No      Mobility  Bed Mobility Overal bed mobility: Modified Independent Bed Mobility: Rolling, Sit to Sidelying Rolling: Modified independent (Device/Increase time)       Sit to sidelying: Modified independent (Device/Increase  time) General bed mobility comments: good use of log roll. HOB flat and rails lowered to simulate home environment.    Transfers Overall transfer level: Needs assistance Equipment used: Rolling walker (2 wheels) Transfers: Sit to/from Stand Sit to Stand: Supervision           General transfer comment: VC's for hand placement on seated surface for safety.    Ambulation/Gait Ambulation/Gait assistance: Contact guard assist Gait Distance (Feet): 350 Feet Assistive device: Rolling walker (2 wheels) Gait Pattern/deviations: Step-through pattern, Decreased stride length, Trunk flexed, Wide base of support Gait velocity: Decreased Gait velocity interpretation: 1.31 - 2.62 ft/sec, indicative of limited community ambulator   General Gait Details: VC's for improved posture, closer walker proximity and forward gaze.  Stairs Stairs: Yes Stairs assistance: Contact guard assist Stair Management: One rail Right, Step to pattern, Forwards Number of Stairs: 3 General stair comments: VC's for sequencing and general safety.  Wheelchair Mobility     Tilt Bed    Modified Rankin (Stroke Patients Only)       Balance Overall balance assessment: Needs assistance Sitting-balance support: Feet supported Sitting balance-Leahy Scale: Good     Standing balance support: No upper extremity supported, During functional activity Standing balance-Leahy Scale: Fair Standing balance comment: statically                             Pertinent Vitals/Pain Pain Assessment Pain Assessment: Faces Faces Pain Scale: Hurts little more Pain Location: back Pain Descriptors / Indicators: Discomfort    Home Living Family/patient expects to be discharged to:: Private residence Living Arrangements: Spouse/significant other;Non-relatives/Friends Available Help at Discharge: Family;Available 24 hours/day Type of Home: House Home Access: Stairs to enter Entrance  Stairs-Rails:  Right;Left Entrance Stairs-Number of Steps: 3   Home Layout: One level Home Equipment: Rollator (4 wheels);Cane - single point      Prior Function Prior Level of Function : Independent/Modified Independent;Driving             Mobility Comments: Spouse has been assisting with stairs and mobility due to bilat hip and LE pain ADLs Comments: mod I, spouse assists with shower transfers     Extremity/Trunk Assessment   Upper Extremity Assessment Upper Extremity Assessment: Defer to OT evaluation    Lower Extremity Assessment Lower Extremity Assessment: Generalized weakness (Consistent with pre-op diagnosis.)    Cervical / Trunk Assessment Cervical / Trunk Assessment: Back Surgery  Communication   Communication Communication: No apparent difficulties  Cognition Arousal: Alert Behavior During Therapy: WFL for tasks assessed/performed Overall Cognitive Status: Within Functional Limits for tasks assessed                                 General Comments: good recall of 3/3 back precuations and precuations        General Comments General comments (skin integrity, edema, etc.): VSS on RA    Exercises     Assessment/Plan    PT Assessment Patient needs continued PT services  PT Problem List Decreased strength;Decreased activity tolerance;Decreased balance;Decreased mobility;Decreased knowledge of use of DME;Decreased safety awareness;Decreased knowledge of precautions;Pain       PT Treatment Interventions DME instruction;Gait training;Stair training;Functional mobility training;Therapeutic activities;Therapeutic exercise;Balance training;Patient/family education    PT Goals (Current goals can be found in the Care Plan section)  Acute Rehab PT Goals Patient Stated Goal: Home today PT Goal Formulation: With patient Time For Goal Achievement: 05/19/23 Potential to Achieve Goals: Good    Frequency Min 5X/week     Co-evaluation                AM-PAC PT "6 Clicks" Mobility  Outcome Measure Help needed turning from your back to your side while in a flat bed without using bedrails?: None Help needed moving from lying on your back to sitting on the side of a flat bed without using bedrails?: None Help needed moving to and from a bed to a chair (including a wheelchair)?: A Little Help needed standing up from a chair using your arms (e.g., wheelchair or bedside chair)?: A Little Help needed to walk in hospital room?: A Little Help needed climbing 3-5 steps with a railing? : A Little 6 Click Score: 20    End of Session Equipment Utilized During Treatment: Gait belt;Back brace Activity Tolerance: Patient tolerated treatment well Patient left: in bed;with call bell/phone within reach Nurse Communication: Mobility status PT Visit Diagnosis: Unsteadiness on feet (R26.81);Pain Pain - part of body:  (back)    Time: 6045-4098 PT Time Calculation (min) (ACUTE ONLY): 17 min   Charges:   PT Evaluation $PT Eval Low Complexity: 1 Low   PT General Charges $$ ACUTE PT VISIT: 1 Visit         Conni Slipper, PT, DPT Acute Rehabilitation Services Secure Chat Preferred Office: [phone]   Marylynn Pearson 05/12/2023, 10:58 AM

## 2023-05-12 NOTE — Discharge Summary (Signed)
Physician Discharge Summary  Patient ID: Paula Hamilton MRN: [id number] DOB/AGE: September 28, 1950 72 y.o.  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 The patient tolerated the procedure well and was taken to the recovery room and then to the floor in stable condition. The hospital course was routine. There were no complications. The wound remained clean dry and intact. Pt had appropriate back soreness. No complaints of leg pain or new N/T/W. The patient remained afebrile with stable vital signs, and tolerated a regular diet. The patient continued to increase activities, and pain was well controlled with oral pain medications.   Consults: None  Significant Diagnostic Studies:  Results for orders placed or performed during the hospital encounter of 05/11/23  Glucose, capillary  Result Value Ref Range   Glucose-Capillary 193 (H) 70 - 99 mg/dL   Comment 1 Notify RN   Glucose, capillary  Result Value Ref Range   Glucose-Capillary 206 (H) 70 - 99 mg/dL  Glucose, capillary  Result Value Ref Range   Glucose-Capillary 385 (H) 70 - 99 mg/dL  Glucose, capillary  Result Value Ref Range   Glucose-Capillary 381 (H) 70 - 99 mg/dL   Comment 1 Notify RN    Comment 2 Document in Chart   Glucose, capillary  Result Value Ref Range   Glucose-Capillary 384 (H) 70 - 99 mg/dL   Comment 1 Notify RN    Comment 2 Document in Chart   I-STAT, chem 8  Result Value Ref Range   Sodium 141 135 - 145 mmol/L   Potassium 5.1 3.5 - 5.1 mmol/L   Chloride 109 98 - 111 mmol/L   BUN 29 (H) 8 - 23 mg/dL   Creatinine, Ser 0.27 (H) 0.44 - 1.00 mg/dL   Glucose, Bld 253 (H) 70 - 99 mg/dL   Calcium, Ion 6.64 4.03 - 1.40 mmol/L    TCO2 25 22 - 32 mmol/L   Hemoglobin 13.9 12.0 - 15.0 g/dL   HCT 47.4 25.9 - 56.3 %    DG Lumbar Spine 2-3 Views  Result Date: 05/11/2023 CLINICAL DATA:  Elective surgery. EXAM: LUMBAR SPINE - 2-3 VIEW COMPARISON:  Preoperative imaging FINDINGS: Two fluoroscopic spot views of the lumbar spine obtained in the operating room. Multilevel fusion hardware with interbody spacers. Fluoroscopy time 27 seconds. Dose 18.4 mGy. IMPRESSION: Intraoperative fluoroscopy during lumbar fusion. Electronically Signed   By: Narda Rutherford M.D.   On: 05/11/2023 11:17   DG C-Arm 1-60 Min-No Report  Result Date: 05/11/2023 Fluoroscopy was utilized by the requesting physician.  No radiographic interpretation.   DG C-Arm 1-60 Min-No Report  Result Date: 05/11/2023 Fluoroscopy was utilized by the requesting physician.  No radiographic interpretation.    Antibiotics:  Anti-infectives (From admission, onward)    Start     Dose/Rate Route Frequency Ordered Stop   05/11/23 1300  ceFAZolin (ANCEF) IVPB 2g/100 mL premix  Status:  Discontinued        2 g 200 mL/hr over 30 Minutes Intravenous Every 8 hours 05/11/23 1213 05/11/23 1217   05/11/23 0700  vancomycin (VANCOCIN) IVPB 1000 mg/200 mL premix        1,000 mg 200 mL/hr over 60 Minutes Intravenous On call to O.R. 05/11/23 [id number] 05/11/23 4332  Discharge Exam: Blood pressure (!) 132/53, pulse 77, temperature 98.1 F (36.7 C), temperature source Oral, resp. rate 16, height 5\' 4"  (1.626 m), weight 68.9 kg, SpO2 99%. Neurologic: Grossly normal Ambulating and voiding well incision cdi   Discharge Medications:   Allergies as of 05/12/2023       Reactions   Codeine Rash, Swelling   Penicillins Rash   PATIENT HAS HAD A PCN REACTION WITH IMMEDIATE RASH, FACIAL/TONGUE/THROAT SWELLING, SOB, OR LIGHTHEADEDNESS WITH HYPOTENSION:  #  #  #  YES  #  #  #   Has patient had a PCN reaction causing severe rash involving mucus membranes or skin necrosis: No Has  patient had a PCN reaction that required hospitalization: No Has patient had a PCN reaction occurring within the last 10 years: No   Pregabalin Other (See Comments)   Suicidal thoughts, extreme depression   Atorvastatin    Doxycycline Swelling   Metformin Other (See Comments)   Profound muscle weakness whren mixed w/actos or januvia   Erythromycin Base Nausea And Vomiting   STOMACH PAIN   Morphine And Codeine Nausea And Vomiting   Has taken in pill form w/o side effects        Medication List     STOP taking these medications    clopidogrel 75 MG tablet Commonly known as: PLAVIX   meloxicam 15 MG tablet Commonly known as: MOBIC       TAKE these medications    acetaminophen 500 MG tablet Commonly known as: TYLENOL Take 1,000 mg by mouth every 6 (six) hours as needed for mild pain.   amLODipine 5 MG tablet Commonly known as: NORVASC Take 5 mg by mouth daily.   aspirin EC 81 MG tablet Take 81 mg by mouth daily. Swallow whole.   diphenhydrAMINE 25 mg capsule Commonly known as: BENADRYL Take 25 mg by mouth at bedtime.   diphenoxylate-atropine 2.5-0.025 MG tablet Commonly known as: Lomotil Take 1 tablet by mouth 3 (three) times daily as needed for diarrhea or loose stools.   hydrochlorothiazide 12.5 MG capsule Commonly known as: MICROZIDE Take 12.5 mg by mouth daily.   Insulin Degludec-Liraglutide 100-3.6 UNIT-MG/ML Sopn Commonly known as: XULTOPHY Inject 36 Units into the skin in the morning.   levETIRAcetam 500 MG tablet Commonly known as: KEPPRA Take 1 tablet (500 mg total) by mouth 2 (two) times daily.   lisinopril 20 MG tablet Commonly known as: ZESTRIL Take 20 mg by mouth daily.   methocarbamol 500 MG tablet Commonly known as: ROBAXIN Take 1 tablet (500 mg total) by mouth every 6 (six) hours as needed for muscle spasms.   oxyCODONE-acetaminophen 5-325 MG tablet Commonly known as: PERCOCET/ROXICET Take 1 tablet by mouth every 4 (four) hours as  needed for severe pain (pain score 7-10).               Durable Medical Equipment  (From admission, onward)           Start     Ordered   05/11/23 1213  DME Walker rolling  Once       Question:  Patient needs a walker to treat with the following condition  Answer:  S/P lumbar fusion   05/11/23 1213   05/11/23 1213  DME 3 n 1  Once        05/11/23 1213            Disposition: home   Final Dx: PLIF L3-4  Discharge Instructions  Remove dressing in 72 hours   Complete by: As directed    Call MD for:   Complete by: As directed    Call MD for:  difficulty breathing, headache or visual disturbances   Complete by: As directed    Call MD for:  hives   Complete by: As directed    Call MD for:  persistant dizziness or light-headedness   Complete by: As directed    Call MD for:  persistant nausea and vomiting   Complete by: As directed    Call MD for:  redness, tenderness, or signs of infection (pain, swelling, redness, odor or green/yellow discharge around incision site)   Complete by: As directed    Call MD for:  severe uncontrolled pain   Complete by: As directed    Call MD for:  temperature >100.4   Complete by: As directed    Diet - low sodium heart healthy   Complete by: As directed    Driving Restrictions   Complete by: As directed    No driving for 2 weeks, no riding in the car for 1 week   Increase activity slowly   Complete by: As directed    Lifting restrictions   Complete by: As directed    No lifting more than 8 lbs          Signed: Tiana Loft Jaonna Word 05/12/2023, 7:42 AM

## 2023-05-12 NOTE — Plan of Care (Signed)
Pt doing well. Pt given D/C instructions with verbal understanding. Rx's were delivered to the Pt prior to D/C from [facility] pharmacy. Pt's incision is clean and dry with no sign of infection. Pt's IV was removed prior to D/C. Pt D/C'd home via wheelchair per MD order. Pt received RW from [employer] per MD order. Pt is stable @ D/C and has no other needs at this time. Rema Fendt, RN

## 2023-05-12 NOTE — Anesthesia Postprocedure Evaluation (Signed)
Anesthesia Post Note  Patient: CHARLA CRISCIONE  Procedure(s) Performed: LUMBAR THREE-FOUR POSTERIOR LUMBAR INTERBODY FUSION (Spine Lumbar)     Patient location during evaluation: PACU Anesthesia Type: General Level of consciousness: awake and alert Pain management: pain level controlled Vital Signs Assessment: post-procedure vital signs reviewed and stable Respiratory status: spontaneous breathing, nonlabored ventilation, respiratory function stable and patient connected to nasal cannula oxygen Cardiovascular status: blood pressure returned to baseline and stable Postop Assessment: no apparent nausea or vomiting Anesthetic complications: no   There were no known notable events for this encounter.  Last Vitals:  Vitals:   05/12/23 0320 05/12/23 0724  BP: (!) 123/52 (!) 132/53  Pulse: 69 77  Resp: 18 16  Temp: 36.7 C 36.7 C  SpO2: 90% 99%    Last Pain:  Vitals:   05/12/23 1045  TempSrc:   PainSc: 3    Pain Goal: Patients Stated Pain Goal: 3 (05/12/23 0947)                 Kennieth Rad

## 2023-05-12 NOTE — Evaluation (Signed)
Occupational Therapy Evaluation Patient Details Name: Paula Hamilton MRN: [id number] DOB: 1950-09-23 Today's Date: 05/12/2023   History of Present Illness Paula Hamilton is a 72 yo female who underwent PLIF L3-4. PMHx: anxiety, arthritis, asthma, CKD, COPD, DM, fibromyalgia, GERD, HTN, seizures, CVA, hypercholesterolemia   Clinical Impression   Paula Hamilton was evaluated s/p the above spine surgery. She is mod I at baseline and lives with her spouse who assists as needed. Upon evaluation pt was limited by surgical pain, L hip and LLE pain (improved since pre-op), generalized weakness and decreased activity tolerance. Overall she needed generalized supervision A for all mobility with RW and ADLs after education. Provided cues and education on spinal precautions and compensatory techniques throughout, handout provided and pt demonstrated great recall during ADLs and mobility. Pt does not require further acute OT services. Recommend d/c home with support of family.         If plan is discharge home, recommend the following: A little help with walking and/or transfers;A little help with bathing/dressing/bathroom;Assistance with cooking/housework;Assist for transportation    Functional Status Assessment  Patient has had a recent decline in their functional status and demonstrates the ability to make significant improvements in function in a reasonable and predictable amount of time.  Equipment Recommendations  Other (comment) (RW)       Precautions / Restrictions Precautions Precautions: Fall;Back Precaution Booklet Issued: Yes (comment) Required Braces or Orthoses: Spinal Brace Spinal Brace: Lumbar corset Restrictions Weight Bearing Restrictions: No      Mobility Bed Mobility Overal bed mobility: Needs Assistance Bed Mobility: Rolling, Sidelying to Sit Rolling: Modified independent (Device/Increase time) Sidelying to sit: Modified independent (Device/Increase time)        General bed mobility comments: good use of log roll    Transfers Overall transfer level: Needs assistance Equipment used: Rolling walker (2 wheels) Transfers: Sit to/from Stand Sit to Stand: Supervision                  Balance Overall balance assessment: Needs assistance Sitting-balance support: Feet supported Sitting balance-Leahy Scale: Good     Standing balance support: No upper extremity supported, During functional activity Standing balance-Leahy Scale: Fair Standing balance comment: statically                           ADL either performed or assessed with clinical judgement   ADL Overall ADL's : Needs assistance/impaired Eating/Feeding: Independent   Grooming: Supervision/safety;Standing   Upper Body Bathing: Set up;Sitting   Lower Body Bathing: Supervison/ safety;Sit to/from stand;Cueing for compensatory techniques   Upper Body Dressing : Set up;Sitting   Lower Body Dressing: Supervision/safety;Cueing for compensatory techniques;Cueing for safety   Toilet Transfer: Supervision/safety;Ambulation;Rolling walker (2 wheels);Regular Toilet   Toileting- Clothing Manipulation and Hygiene: Supervision/safety;Sitting/lateral lean       Functional mobility during ADLs: Supervision/safety;Rolling walker (2 wheels) General ADL Comments: cues for compensatory techniques with great demonstration     Vision Baseline Vision/History: 0 No visual deficits Vision Assessment?: No apparent visual deficits     Perception Perception: Within Functional Limits       Praxis Praxis: WFL       Pertinent Vitals/Pain Pain Assessment Pain Assessment: Faces Faces Pain Scale: Hurts little more Pain Location: back Pain Descriptors / Indicators: Discomfort Pain Intervention(s): Limited activity within patient's tolerance, Monitored during session     Extremity/Trunk Assessment Upper Extremity Assessment Upper Extremity Assessment: Overall WFL for tasks  assessed   Lower Extremity  Assessment Lower Extremity Assessment: Defer to PT evaluation   Cervical / Trunk Assessment Cervical / Trunk Assessment: Back Surgery   Communication Communication Communication: No apparent difficulties   Cognition Arousal: Alert Behavior During Therapy: WFL for tasks assessed/performed Overall Cognitive Status: Within Functional Limits for tasks assessed                                 General Comments: good recall of 3/3 back precuations and precuations     General Comments  VSS on RA     Home Living Family/patient expects to be discharged to:: Private residence Living Arrangements: Spouse/significant other;Non-relatives/Friends Available Help at Discharge: Family;Available 24 hours/day Type of Home: House Home Access: Stairs to enter [employer] of Steps: 3 Entrance Stairs-Rails: Right;Left Home Layout: One level     Bathroom Shower/Tub: Chief Strategy Officer: Standard     Home Equipment: Rollator (4 wheels);Cane - single point          Prior Functioning/Environment Prior Level of Function : Independent/Modified Independent;Driving             Mobility Comments: Spouse has been assisting with stairs and mobility due to bilat hip and LE pain ADLs Comments: mod I, spouse assists with shower transfers        OT Problem List: Decreased range of motion;Decreased activity tolerance;Impaired balance (sitting and/or standing);Decreased safety awareness;Decreased knowledge of use of DME or AE;Decreased knowledge of precautions         OT Goals(Current goals can be found in the care plan section) Acute Rehab OT Goals Patient Stated Goal: home today OT Goal Formulation: With patient Time For Goal Achievement: 05/12/23 Potential to Achieve Goals: Good   AM-PAC OT "6 Clicks" Daily Activity     Outcome Measure Help from another person eating meals?: None Help from another person taking care of  personal grooming?: A Little Help from another person toileting, which includes using toliet, bedpan, or urinal?: A Little Help from another person bathing (including washing, rinsing, drying)?: A Little Help from another person to put on and taking off regular upper body clothing?: A Little Help from another person to put on and taking off regular lower body clothing?: A Little 6 Click Score: 19   End of Session Equipment Utilized During Treatment: Rolling walker (2 wheels);Back brace Nurse Communication: Mobility status  Activity Tolerance: Patient tolerated treatment well Patient left: in chair;with call bell/phone within reach  OT Visit Diagnosis: Unsteadiness on feet (R26.81);Other abnormalities of gait and mobility (R26.89);Muscle weakness (generalized) (M62.81)                Time: 2355-7322 OT Time Calculation (min): 22 min Charges:  OT General Charges $OT Visit: 1 Visit OT Evaluation $OT Eval Low Complexity: 1 Low  Derenda Mis, OTR/L Acute Rehabilitation Services Office [phone] Secure Chat Communication Preferred   Donia Pounds 05/12/2023, 9:41 AM

## 2023-06-14 ENCOUNTER — Other Ambulatory Visit (HOSPITAL_COMMUNITY): Payer: Self-pay | Admitting: Student

## 2023-06-14 DIAGNOSIS — M5416 Radiculopathy, lumbar region: Secondary | ICD-10-CM

## 2023-06-16 ENCOUNTER — Telehealth: Payer: Self-pay | Admitting: Diagnostic Neuroimaging

## 2023-06-16 NOTE — Telephone Encounter (Signed)
Pt states she started having numbness in her right arm and face, she went to [place] ER last night and CT scan was done which came back clear. She is requesting a MRI. Requesting call back

## 2023-06-16 NOTE — Telephone Encounter (Signed)
Called the patient again. She states that she went back to [facility] ER and they are completing a MRI there and that she will call the office call back if she needs [place].

## 2023-06-16 NOTE — Telephone Encounter (Signed)
Attempted to call the patient back. Sounded like the phone picked up and then disconnected. Will attempt to call back.

## 2023-06-24 ENCOUNTER — Ambulatory Visit (HOSPITAL_COMMUNITY)
Admission: RE | Admit: 2023-06-24 | Discharge: 2023-06-24 | Disposition: A | Discharge status: Home / Self Care | Payer: 59 | Source: Ambulatory Visit | Attending: Student | Admitting: Student

## 2023-06-24 DIAGNOSIS — M5416 Radiculopathy, lumbar region: Secondary | ICD-10-CM | POA: Insufficient documentation

## 2023-10-05 ENCOUNTER — Encounter (INDEPENDENT_AMBULATORY_CARE_PROVIDER_SITE_OTHER): Admitting: Ophthalmology

## 2023-10-05 DIAGNOSIS — H35371 Puckering of macula, right eye: Secondary | ICD-10-CM | POA: Diagnosis not present

## 2023-10-05 DIAGNOSIS — I10 Essential (primary) hypertension: Secondary | ICD-10-CM | POA: Diagnosis not present

## 2023-10-05 DIAGNOSIS — E113593 Type 2 diabetes mellitus with proliferative diabetic retinopathy without macular edema, bilateral: Secondary | ICD-10-CM

## 2023-10-05 DIAGNOSIS — Z794 Long term (current) use of insulin: Secondary | ICD-10-CM | POA: Diagnosis not present

## 2023-10-05 DIAGNOSIS — H35033 Hypertensive retinopathy, bilateral: Secondary | ICD-10-CM

## 2023-11-07 ENCOUNTER — Telehealth: Payer: Self-pay | Admitting: Diagnostic Neuroimaging

## 2023-11-07 NOTE — Telephone Encounter (Signed)
 Patient said feel like the whole room is spinning last week. Happens when standing, sitting, lying down last  3 minutes to 20 minutes. Pt requesting a call back. Informed patient would need a referral for a new symptom for dizziness.

## 2023-11-07 NOTE — Telephone Encounter (Signed)
 Called the pt and there was no answer. I left a detailed message advising the patient that we received her message in regard to new symptoms. Left a message advising that due to this being caused by so many different things, it is best if it is addressed by PCP first to rule out other factors that could be causing. Instructed her to call back with questions.

## 2023-12-18 DIAGNOSIS — I219 Acute myocardial infarction, unspecified: Secondary | ICD-10-CM

## 2023-12-18 HISTORY — DX: Acute myocardial infarction, unspecified: I21.9

## 2023-12-20 ENCOUNTER — Inpatient Hospital Stay: Admit: 2023-12-20 | Admitting: Internal Medicine

## 2023-12-20 ENCOUNTER — Encounter (HOSPITAL_COMMUNITY): Payer: Self-pay

## 2023-12-20 ENCOUNTER — Ambulatory Visit (HOSPITAL_COMMUNITY)
Admission: EM | Disposition: A | Discharge status: Home / Self Care | Payer: Self-pay | Source: Home / Self Care | Attending: Cardiology

## 2023-12-20 ENCOUNTER — Other Ambulatory Visit: Payer: Self-pay

## 2023-12-20 ENCOUNTER — Observation Stay (HOSPITAL_COMMUNITY)
Admission: EM | Admit: 2023-12-20 | Discharge: 2023-12-21 | Disposition: A | Discharge status: Home / Self Care | Attending: Cardiology | Admitting: Cardiology

## 2023-12-20 ENCOUNTER — Encounter (HOSPITAL_COMMUNITY): Payer: Self-pay | Admitting: Cardiology

## 2023-12-20 DIAGNOSIS — Z79899 Other long term (current) drug therapy: Secondary | ICD-10-CM | POA: Insufficient documentation

## 2023-12-20 DIAGNOSIS — I2510 Atherosclerotic heart disease of native coronary artery without angina pectoris: Secondary | ICD-10-CM

## 2023-12-20 DIAGNOSIS — Z8673 Personal history of transient ischemic attack (TIA), and cerebral infarction without residual deficits: Secondary | ICD-10-CM | POA: Diagnosis not present

## 2023-12-20 DIAGNOSIS — Z720 Tobacco use: Secondary | ICD-10-CM | POA: Insufficient documentation

## 2023-12-20 DIAGNOSIS — I129 Hypertensive chronic kidney disease with stage 1 through stage 4 chronic kidney disease, or unspecified chronic kidney disease: Secondary | ICD-10-CM | POA: Insufficient documentation

## 2023-12-20 DIAGNOSIS — Z7982 Long term (current) use of aspirin: Secondary | ICD-10-CM | POA: Insufficient documentation

## 2023-12-20 DIAGNOSIS — N189 Chronic kidney disease, unspecified: Secondary | ICD-10-CM | POA: Insufficient documentation

## 2023-12-20 DIAGNOSIS — J449 Chronic obstructive pulmonary disease, unspecified: Secondary | ICD-10-CM | POA: Insufficient documentation

## 2023-12-20 DIAGNOSIS — E1122 Type 2 diabetes mellitus with diabetic chronic kidney disease: Secondary | ICD-10-CM | POA: Insufficient documentation

## 2023-12-20 DIAGNOSIS — F17210 Nicotine dependence, cigarettes, uncomplicated: Secondary | ICD-10-CM | POA: Diagnosis not present

## 2023-12-20 DIAGNOSIS — E785 Hyperlipidemia, unspecified: Secondary | ICD-10-CM | POA: Diagnosis not present

## 2023-12-20 DIAGNOSIS — E7800 Pure hypercholesterolemia, unspecified: Secondary | ICD-10-CM | POA: Insufficient documentation

## 2023-12-20 DIAGNOSIS — I214 Non-ST elevation (NSTEMI) myocardial infarction: Principal | ICD-10-CM | POA: Insufficient documentation

## 2023-12-20 DIAGNOSIS — R0902 Hypoxemia: Secondary | ICD-10-CM

## 2023-12-20 DIAGNOSIS — E118 Type 2 diabetes mellitus with unspecified complications: Secondary | ICD-10-CM

## 2023-12-20 DIAGNOSIS — Z955 Presence of coronary angioplasty implant and graft: Secondary | ICD-10-CM

## 2023-12-20 DIAGNOSIS — I10 Essential (primary) hypertension: Secondary | ICD-10-CM | POA: Insufficient documentation

## 2023-12-20 DIAGNOSIS — F17200 Nicotine dependence, unspecified, uncomplicated: Secondary | ICD-10-CM | POA: Diagnosis not present

## 2023-12-20 HISTORY — PX: LEFT HEART CATH AND CORONARY ANGIOGRAPHY: CATH118249

## 2023-12-20 HISTORY — PX: CORONARY STENT INTERVENTION: CATH118234

## 2023-12-20 LAB — CBC
HCT: 42.4 % (ref 36.0–46.0)
Hemoglobin: 14.2 g/dL (ref 12.0–15.0)
MCH: 32.1 pg (ref 26.0–34.0)
MCHC: 33.5 g/dL (ref 30.0–36.0)
MCV: 95.7 fL (ref 80.0–100.0)
Platelets: 329 10*3/uL (ref 150–400)
RBC: 4.43 MIL/uL (ref 3.87–5.11)
RDW: 13.7 % (ref 11.5–15.5)
WBC: 12.1 10*3/uL — ABNORMAL HIGH (ref 4.0–10.5)
nRBC: 0 % (ref 0.0–0.2)

## 2023-12-20 LAB — POCT ACTIVATED CLOTTING TIME
Activated Clotting Time: 239 s
Activated Clotting Time: 337 s
Activated Clotting Time: 395 s

## 2023-12-20 LAB — CREATININE, SERUM
Creatinine, Ser: 0.95 mg/dL (ref 0.44–1.00)
GFR, Estimated: >60 mL/min (ref >60)

## 2023-12-20 SURGERY — LEFT HEART CATH AND CORONARY ANGIOGRAPHY
Anesthesia: LOCAL

## 2023-12-20 MED ORDER — LISINOPRIL 20 MG PO TABS
20.0000 mg | ORAL_TABLET | Freq: Every day | ORAL | Status: DC
  Administered 2023-12-20 – 2023-12-21 (×2): 20 mg via ORAL
  Filled 2023-12-20 (×2): qty 1

## 2023-12-20 MED ORDER — MIDAZOLAM HCL 2 MG/2ML IJ SOLN
INTRAMUSCULAR | Status: AC
Start: 2023-12-20 — End: 2023-12-20
  Filled 2023-12-20: qty 2

## 2023-12-20 MED ORDER — FENTANYL CITRATE (PF) 100 MCG/2ML IJ SOLN
INTRAMUSCULAR | Status: AC
  Filled 2023-12-20: qty 2

## 2023-12-20 MED ORDER — NITROGLYCERIN IN D5W 200-5 MCG/ML-% IV SOLN
INTRAVENOUS | Status: AC
  Filled 2023-12-20: qty 250

## 2023-12-20 MED ORDER — SODIUM CHLORIDE 0.9% FLUSH: 3.0000 mL | INTRAVENOUS | Status: DC | PRN

## 2023-12-20 MED ORDER — INSULIN DEGLUDEC-LIRAGLUTIDE 100-3.6 UNIT-MG/ML ~~LOC~~ SOPN: 36.0000 [IU] | PEN_INJECTOR | Freq: Every morning | SUBCUTANEOUS | Status: DC

## 2023-12-20 MED ORDER — NITROGLYCERIN IN D5W 200-5 MCG/ML-% IV SOLN: 0.0000 ug/min | INTRAVENOUS | Status: DC

## 2023-12-20 MED ORDER — IOHEXOL 350 MG/ML SOLN
INTRAVENOUS | Status: DC | PRN
  Administered 2023-12-20: 185 mL

## 2023-12-20 MED ORDER — MIDAZOLAM HCL 2 MG/2ML IJ SOLN
INTRAMUSCULAR | Status: DC | PRN
  Administered 2023-12-20: 2 mg via INTRAVENOUS
  Administered 2023-12-20: 1 mg via INTRAVENOUS

## 2023-12-20 MED ORDER — HYDROCHLOROTHIAZIDE 12.5 MG PO TABS
12.5000 mg | ORAL_TABLET | Freq: Every day | ORAL | Status: DC
  Administered 2023-12-20 – 2023-12-21 (×2): 12.5 mg via ORAL
  Filled 2023-12-20 (×2): qty 1

## 2023-12-20 MED ORDER — NITROGLYCERIN 1 MG/10 ML FOR IR/CATH LAB
INTRA_ARTERIAL | Status: AC
  Filled 2023-12-20: qty 10

## 2023-12-20 MED ORDER — FENTANYL CITRATE (PF) 100 MCG/2ML IJ SOLN
INTRAMUSCULAR | Status: DC | PRN
  Administered 2023-12-20 (×2): 25 ug via INTRAVENOUS

## 2023-12-20 MED ORDER — AMLODIPINE BESYLATE 5 MG PO TABS
5.0000 mg | ORAL_TABLET | Freq: Every day | ORAL | Status: DC
  Administered 2023-12-20 – 2023-12-21 (×2): 5 mg via ORAL
  Filled 2023-12-20 (×2): qty 1

## 2023-12-20 MED ORDER — ACETAMINOPHEN 325 MG PO TABS: 650.0000 mg | ORAL_TABLET | ORAL | Status: DC | PRN

## 2023-12-20 MED ORDER — SODIUM CHLORIDE 0.9% FLUSH
3.0000 mL | Freq: Two times a day (BID) | INTRAVENOUS | Status: DC
  Administered 2023-12-20 – 2023-12-21 (×2): 3 mL via INTRAVENOUS

## 2023-12-20 MED ORDER — ENOXAPARIN SODIUM 40 MG/0.4ML IJ SOSY
40.0000 mg | PREFILLED_SYRINGE | INTRAMUSCULAR | Status: DC
Start: 2023-12-21 — End: 2023-12-21
  Administered 2023-12-21: 40 mg via SUBCUTANEOUS
  Filled 2023-12-20: qty 0.4

## 2023-12-20 MED ORDER — HEPARIN (PORCINE) IN NACL 1000-0.9 UT/500ML-% IV SOLN
INTRAVENOUS | Status: DC | PRN
  Administered 2023-12-20 (×3): 500 mL

## 2023-12-20 MED ORDER — ONDANSETRON HCL 4 MG/2ML IJ SOLN: 4.0000 mg | Freq: Four times a day (QID) | INTRAMUSCULAR | Status: DC | PRN

## 2023-12-20 MED ORDER — VERAPAMIL HCL 2.5 MG/ML IV SOLN
INTRAVENOUS | Status: DC | PRN
  Administered 2023-12-20: 10 mL via INTRA_ARTERIAL

## 2023-12-20 MED ORDER — HYDRALAZINE HCL 20 MG/ML IJ SOLN: 10.0000 mg | INTRAMUSCULAR | Status: AC | PRN

## 2023-12-20 MED ORDER — LEVETIRACETAM 500 MG PO TABS
500.0000 mg | ORAL_TABLET | Freq: Two times a day (BID) | ORAL | Status: DC
  Administered 2023-12-20 – 2023-12-21 (×2): 500 mg via ORAL
  Filled 2023-12-20 (×2): qty 1

## 2023-12-20 MED ORDER — HEPARIN SODIUM (PORCINE) 1000 UNIT/ML IJ SOLN
INTRAMUSCULAR | Status: DC | PRN
  Administered 2023-12-20: 3000 [IU] via INTRAVENOUS
  Administered 2023-12-20 (×2): 3500 [IU] via INTRAVENOUS

## 2023-12-20 MED ORDER — NITROGLYCERIN IN D5W 200-5 MCG/ML-% IV SOLN
INTRAVENOUS | Status: AC | PRN
  Administered 2023-12-20: 10 ug/min via INTRAVENOUS

## 2023-12-20 MED ORDER — LIDOCAINE HCL (PF) 1 % IJ SOLN
INTRAMUSCULAR | Status: AC
  Filled 2023-12-20: qty 30

## 2023-12-20 MED ORDER — OXYCODONE-ACETAMINOPHEN 5-325 MG PO TABS
1.0000 | ORAL_TABLET | ORAL | Status: DC | PRN
  Administered 2023-12-20 – 2023-12-21 (×3): 1 via ORAL
  Filled 2023-12-20 (×3): qty 1

## 2023-12-20 MED ORDER — SODIUM CHLORIDE 0.9 % IV SOLN: 250.0000 mL | INTRAVENOUS | Status: AC | PRN

## 2023-12-20 MED ORDER — NITROGLYCERIN 1 MG/10 ML FOR IR/CATH LAB
INTRA_ARTERIAL | Status: DC | PRN
  Administered 2023-12-20 (×5): 200 ug via INTRACORONARY

## 2023-12-20 MED ORDER — CLOPIDOGREL BISULFATE 75 MG PO TABS
75.0000 mg | ORAL_TABLET | Freq: Every day | ORAL | Status: DC
  Administered 2023-12-21: 75 mg via ORAL
  Filled 2023-12-20: qty 1

## 2023-12-20 MED ORDER — MIDAZOLAM HCL 2 MG/2ML IJ SOLN
INTRAMUSCULAR | Status: AC
  Filled 2023-12-20: qty 2

## 2023-12-20 MED ORDER — METHOCARBAMOL 500 MG PO TABS
500.0000 mg | ORAL_TABLET | Freq: Four times a day (QID) | ORAL | Status: DC | PRN
  Administered 2023-12-20 – 2023-12-21 (×2): 500 mg via ORAL
  Filled 2023-12-20 (×2): qty 1

## 2023-12-20 MED ORDER — ASPIRIN 81 MG PO TBEC
81.0000 mg | DELAYED_RELEASE_TABLET | Freq: Every day | ORAL | Status: DC
  Administered 2023-12-20 – 2023-12-21 (×2): 81 mg via ORAL
  Filled 2023-12-20 (×2): qty 1

## 2023-12-20 MED ORDER — HYDROCHLOROTHIAZIDE 12.5 MG PO CAPS
12.5000 mg | ORAL_CAPSULE | Freq: Every day | ORAL | Status: DC
  Filled 2023-12-20: qty 1

## 2023-12-20 MED ORDER — ONDANSETRON HCL 4 MG/2ML IJ SOLN
INTRAMUSCULAR | Status: DC | PRN
  Administered 2023-12-20: 4 mg via INTRAVENOUS

## 2023-12-20 MED ORDER — HEPARIN SODIUM (PORCINE) 1000 UNIT/ML IJ SOLN
INTRAMUSCULAR | Status: AC
  Filled 2023-12-20: qty 10

## 2023-12-20 MED ORDER — LIDOCAINE HCL (PF) 1 % IJ SOLN
INTRAMUSCULAR | Status: DC | PRN
  Administered 2023-12-20: 2 mL

## 2023-12-20 MED ORDER — VERAPAMIL HCL 2.5 MG/ML IV SOLN
INTRAVENOUS | Status: AC
  Filled 2023-12-20: qty 2

## 2023-12-20 MED ORDER — METOPROLOL SUCCINATE ER 25 MG PO TB24
25.0000 mg | ORAL_TABLET | Freq: Every day | ORAL | Status: DC
  Administered 2023-12-20 – 2023-12-21 (×2): 25 mg via ORAL
  Filled 2023-12-20 (×2): qty 1

## 2023-12-20 MED ORDER — LABETALOL HCL 5 MG/ML IV SOLN: 10.0000 mg | INTRAVENOUS | Status: AC | PRN

## 2023-12-20 SURGICAL SUPPLY — 16 items
BALLOON EMERGE MR 2.0X12 (BALLOONS) IMPLANT
BALLOON EMERGE MR 2.5X12 (BALLOONS) IMPLANT
CATH 5FR JL3.5 JR4 ANG PIG MP (CATHETERS) IMPLANT
CATH VISTA GUIDE 6FR XB3.5 EPK (CATHETERS) IMPLANT
DEVICE RAD COMP TR BAND LRG (VASCULAR PRODUCTS) IMPLANT
ELECT DEFIB PAD ADLT CADENCE (PAD) IMPLANT
GLIDESHEATH SLEND SS 6F .021 (SHEATH) IMPLANT
GUIDEWIRE INQWIRE 1.5J.035X260 (WIRE) IMPLANT
KIT ENCORE 26 ADVANTAGE (KITS) IMPLANT
PACK CARDIAC CATHETERIZATION (CUSTOM PROCEDURE TRAY) ×1 IMPLANT
SET ATX-X65L (MISCELLANEOUS) IMPLANT
STENT SYNERGY XD 2.50X16 (Permanent Stent) IMPLANT
STENT SYNERGY XD 2.75X20 (Permanent Stent) IMPLANT
TUBING CIL FLEX 10 FLL-RA (TUBING) IMPLANT
WIRE ASAHI PROWATER 180CM (WIRE) IMPLANT
WIRE RUNTHROUGH .014X180CM (WIRE) IMPLANT

## 2023-12-20 NOTE — H&P (Signed)
 Cardiology Admission History and Physical   Patient ID: [name] MRN: [id number]; DOB: Dec 07, 1950   Admission date: 12/20/2023  PCP:  Angelique Barer, MD   [facility] Providers Cardiologist:  None    Chief Complaint: Chest pain  Patient Profile: Paula Hamilton is a 73 y.o. female with a past medical history of diabetes, peripheral vascular disease, history of multiple strokes, ongoing tobacco use, hyperlipidemia, hypertension who is being seen 12/20/2023 for the evaluation of NSTEMI.  History of Present Illness: Paula Hamilton is a 73 year old female with above medical history.  Per chart review, she does not follow with cardiology.  She presented to [facility] ED on 6/16 complaining of chest pain.  Reported that pain had started the day before and was intermittent.  She woke up on 6/16 with chest pain and decided to go to the ED.  Started on IV heparin and Nitropaste. Chest pain initially resolved with treatment but then returned the evening of 6/16.   Initial EKG at [facility] showed normal sinus rhythm with HR 73 BPM, mild ST depression in V1 and V2, minimal ST elevation in aVL and lead I. Repeat EKG showed normal sinus rhythm, no ST or T wave abnormalities.   Labs in the [place] ED significant for  - WBC 10.9 - Hemoglobin 15.8 - Platelets 341  - K 3.9 - Creatinine 0.90 - Troponin I 0.50>1.84>4.77  She was treated with IV heparin, started on statin, ASA. She was transported to [facility] for cardiac catheterization. In the cath lab holding area, she reports having ongoing chest discomfort. Ranks pain a 3-4 out of 10. Relieved with dilaudid . She denies shortness of breath    Past Medical History:  Diagnosis Date   3rd cranial nerve palsy, right    just start 03/13/2017   Ambulates with cane    straight   Anxiety    Arthritis    listhesis, instability   Asthma    Bronchitis 02/2023   just finished Z pack and is taking cough syrup with Codiene for cough   Chronic  kidney disease    Chronic pain    COPD (chronic obstructive pulmonary disease) (HCC)    Depression    Diabetes mellitus without complication (HCC)    type 2   Diabetic retinopathy (HCC)    Dysphagia    Fibromyalgia    GERD (gastroesophageal reflux disease)    Headache(784.0)    Heart murmur    mentioned yrs. ago, never mentioned again.    History of colon polyps    Hx of migraines    Hypercholesterolemia    Hypertension    Memory loss    mild since stroke   Mental disorder    bipolar disorder - patient denies this dx as of 05/04/23.   Neuropathy    legs and feet   Pneumonia    x 1 yrs ago   PONV (postoperative nausea and vomiting)    has had other surgeries with no problems   Seizures (HCC) 09/2018   w/stroke in 2020   Shortness of breath    Stroke (HCC) 09/15/2018   multiple   Walker as ambulation aid    rollator walker   Past Surgical History:  Procedure Laterality Date   ABDOMINAL HYSTERECTOMY     APPENDECTOMY     age 19   BACK SURGERY     lower back 4 time, tens surgery too   CERVICAL FUSION     COLONOSCOPY W/ BIOPSIES AND POLYPECTOMY  11/21/2014  Colonic polyp status post polypectomy. benign. Mild sigmoid diverticulosis.   ESOPHAGOGASTRODUODENOSCOPY  02/26/2016   Moderate gastritis. Otherwise normal EGD    EYE SURGERY     laser surgery on left eye 2023; cataracts removed bilateral 2023   MAXIMUM ACCESS (MAS)POSTERIOR LUMBAR INTERBODY FUSION (PLIF) 1 LEVEL N/A 04/12/2013   Procedure: Lumba four-five maximum access surgery Posterior Lumbar Interbody Fusion;  Surgeon: Isadora Mar, MD;  Location: [facility] NEURO ORS;  Service: Neurosurgery;  Laterality: N/A;  Lumba four-five maximum access surgery Posterior Lumbar Interbody Fusion   SPINAL CORD STIMULATOR INSERTION N/A 06/14/2014   Procedure: LUMBAR SPINAL CORD STIMULATOR INSERTION;  Surgeon: Darryl Endow, MD;  Location: [facility] NEURO ORS;  Service: Neurosurgery;  Laterality: N/A;   SPINAL CORD STIMULATOR REMOVAL N/A  04/12/2017   Procedure: LUMBAR SPINAL CORD STIMULATOR REMOVAL;  Surgeon: Gerri Kras, MD;  Location: [facility] OR;  Service: Neurosurgery;  Laterality: N/A;   TUBAL LIGATION  1984     Medications Prior to Admission: Prior to Admission medications   Medication Sig Start Date End Date Taking? Authorizing Provider  acetaminophen  (TYLENOL ) 500 MG tablet Take 1,000 mg by mouth every 6 (six) hours as needed for mild pain.    [provider]  amLODipine  (NORVASC ) 5 MG tablet Take 5 mg by mouth daily. 04/19/23   [provider]  aspirin  EC 81 MG tablet Take 81 mg by mouth daily. Swallow whole.    [provider]  diphenhydrAMINE  (BENADRYL ) 25 mg capsule Take 25 mg by mouth at bedtime.    [provider]  diphenoxylate -atropine  (LOMOTIL ) 2.5-0.025 MG tablet Take 1 tablet by mouth 3 (three) times daily as needed for diarrhea or loose stools. Patient not taking: Reported on 04/27/2023 07/11/18   Lajuan Pila, MD  hydrochlorothiazide  (MICROZIDE ) 12.5 MG capsule Take 12.5 mg by mouth daily. 04/05/23   [provider]  Insulin  Degludec-Liraglutide (XULTOPHY) 100-3.6 UNIT-MG/ML SOPN Inject 36 Units into the skin in the morning.    [provider]  levETIRAcetam  (KEPPRA ) 500 MG tablet Take 1 tablet (500 mg total) by mouth 2 (two) times daily. 11/20/19   Merriam Abbey, DO  lisinopril  (ZESTRIL ) 20 MG tablet Take 20 mg by mouth daily. 07/31/21   [provider]  methocarbamol  (ROBAXIN ) 500 MG tablet Take 1 tablet (500 mg total) by mouth every 6 (six) hours as needed for muscle spasms. 05/12/23   Meyran, Kenard Paul, NP  oxyCODONE -acetaminophen  (PERCOCET/ROXICET) 5-325 MG tablet Take 1 tablet by mouth every 4 (four) hours as needed for severe pain (pain score 7-10). 05/12/23   Meyran, Kenard Paul, NP     Allergies:    Allergies  Allergen Reactions   Codeine Rash and Swelling   Penicillins Rash    PATIENT HAS HAD A PCN REACTION WITH IMMEDIATE RASH,  FACIAL/TONGUE/THROAT SWELLING, SOB, OR LIGHTHEADEDNESS WITH HYPOTENSION:  #  #  #  YES  #  #  #   Has patient had a PCN reaction causing severe rash involving mucus membranes or skin necrosis: No Has patient had a PCN reaction that required hospitalization: No Has patient had a PCN reaction occurring within the last 10 years: No    Pregabalin  Other (See Comments)    Suicidal thoughts, extreme depression   Atorvastatin    Doxycycline Swelling   Metformin Other (See Comments)    Profound muscle weakness whren mixed w/actos or januvia   Erythromycin Base Nausea And Vomiting    STOMACH PAIN   Morphine  And Codeine Nausea  And Vomiting    Has taken in pill form w/o side effects    Social History:   Social History   Socioeconomic History   Marital status: Widowed    Spouse name: Not on file   Number of children: 2   Years of education: Not on file   Highest education level: Some college, no degree  Occupational History    Comment: retired  Tobacco Use   Smoking status: Every Day    Current packs/day: 0.00    Average packs/day: 1 pack/day for 30.0 years (30.0 ttl pk-yrs)    Types: Cigarettes    Start date: 05/21/1981    Last attempt to quit: 05/22/2011    Years since quitting: 12.5   Smokeless tobacco: Never   Tobacco comments:    Started smoking again in back in 2012 per patient on 04/29/23.  Vaping Use   Vaping status: Never Used  Substance and Sexual Activity   Alcohol  use: Yes    Comment: occasional beer   Drug use: No   Sexual activity: Yes    Birth control/protection: Surgical    Comment: Hysterectomy  Other Topics Concern   Not on file  Social History Narrative   Lives with sister, bro-in-law   Caffeine- not much   Social Drivers of Corporate investment banker Strain: Not on file  Food Insecurity: Not on file  Transportation Needs: Not on file  Physical Activity: Not on file  Stress: Not on file  Social Connections: Not on file  Intimate Partner Violence:  Not on file     Family History:   The patient's family history includes Breast cancer in her mother; COPD in her mother; Colon cancer in her paternal uncle; Diabetes in her brother, brother, and sister; Kidney cancer in her paternal uncle; Lung cancer in her mother; Melanoma in her father; Stomach cancer in her paternal aunt; Stroke in her brother and sister. There is no history of Esophageal cancer.    ROS:  Please see the history of present illness.  All other ROS reviewed and negative.     Physical Exam/Data: There were no vitals filed for this visit. No intake or output data in the 24 hours ending 12/20/23 1239    05/11/2023    6:57 AM 05/04/2023    3:02 PM 12/21/2022    4:50 PM  Last 3 Weights  Weight (lbs) 152 lb 132 lb 12.8 oz 153 lb  Weight (kg) 68.947 kg 60.238 kg 69.4 kg     There is no height or weight on file to calculate BMI.  General:  Well nourished, well developed, in no acute distress. Sitting comfortably in the bed  HEENT: normal Neck: no JVD Vascular: No carotid bruits; Radial pulses 2+ bilaterally   Cardiac:  normal S1, S2; RRR; no murmur   Lungs:  clear to auscultation bilaterally, no wheezing, rhonchi or rales. Normal WOB on [place]  Abd: soft, nontender  Ext: no edema in BLE  Musculoskeletal:  No deformities Skin: warm and dry  Neuro:  CNs 2-12 intact, no focal abnormalities noted Psych:  Normal affect    Relevant CV Studies:   Laboratory Data: High Sensitivity Troponin:  No results for input(s): TROPONINIHS in the last 720 hours.    ChemistryNo results for input(s): NA, K, CL, CO2, GLUCOSE, BUN, CREATININE, CALCIUM, MG, GFRNONAA, GFRAA, ANIONGAP in the last 168 hours.  No results for input(s): PROT, ALBUMIN , AST, ALT, ALKPHOS, BILITOT in the last 168 hours. Lipids No results for  input(s): CHOL, TRIG, HDL, LABVLDL, LDLCALC, CHOLHDL in the last 168 hours. HematologyNo results for input(s): WBC, RBC,  HGB, HCT, MCV, MCH, MCHC, RDW, PLT in the last 168 hours. Thyroid  No results for input(s): TSH, FREET4 in the last 168 hours. BNPNo results for input(s): BNP, PROBNP in the last 168 hours.  DDimer No results for input(s): DDIMER in the last 168 hours.  Radiology/Studies:  No results found.   Assessment and Plan:  NSTEMI  -Patient presented to [facility] 6/16 with chest pain.  Troponin I 0.50> 1.84> 4.77.  - Initial EKG at [facility] showed normal sinus rhythm with HR 73 BPM, mild ST depression in V1 and V2, minimal ST elevation in aVL and lead I. Repeat EKG showed normal sinus rhythm, no ST or T wave abnormalities.  - Overall, presentation concerning for NSTEMI. She was started on IV heparin and nitropaste, transferred to [facility] for cath  - In cath lab holding, she continues to have mild chest discomfort. Ranked 3-4/10. Reports pain is only relieved by Dilaudid   - Asked cath lab to schedule cath for next case given NSTEMI with ongoing pain  - Patient was consented for cath by Dr. Krasowski at [place]. Reviewed risks and benefits of cath, patient is in agreement with proceeding  - Further recommendations pending cath   Type 2 DM  - Will utilize SSI while admitted   Nicotine Dependence - Counseled on tobacco cessation   History of CVA  - Patient reports having multiple strokes in the past. Has been on plavix monotherapy   Risk Assessment/Risk Scores:   TIMI Risk Score for Unstable Angina or Non-ST Elevation MI:   The patient's TIMI risk score is 3, which indicates a 13% risk of all cause mortality, new or recurrent myocardial infarction or need for urgent revascularization in the next 14 days.{    Severity of Illness: The appropriate patient status for this patient is INPATIENT. Inpatient status is judged to be reasonable and necessary in order to provide the required intensity of service to ensure the patient's safety. The patient's presenting symptoms,  physical exam findings, and initial radiographic and laboratory data in the context of their chronic comorbidities is felt to place them at high risk for further clinical deterioration. Furthermore, it is not anticipated that the patient will be medically stable for discharge from the hospital within 2 midnights of admission.   * I certify that at the point of admission it is my clinical judgment that the patient will require inpatient hospital care spanning beyond 2 midnights from the point of admission due to high intensity of service, high risk for further deterioration and high frequency of surveillance required.*  For questions or updates, please contact [facility] Please consult [email or web address] for contact info under     Signed, Debria Fang, PA-C  12/20/2023 12:39 PM

## 2023-12-20 NOTE — Interval H&P Note (Signed)
 History and Physical Interval Note:  12/20/2023 1:06 PM  Paula Hamilton  has presented today for surgery, with the diagnosis of nstemi.  The various methods of treatment have been discussed with the patient and family. After consideration of risks, benefits and other options for treatment, the patient has consented to  Procedure(s): LEFT HEART CATH AND CORONARY ANGIOGRAPHY (N/A) as a surgical intervention.  The patient's history has been reviewed, patient examined, no change in status, stable for surgery.  I have reviewed the patient's chart and labs.  Questions were answered to the patient's satisfaction.   Cath Lab Visit (complete for each Cath Lab visit)  Clinical Evaluation Leading to the Procedure:   ACS: Yes.    Non-ACS:    Anginal Classification: CCS IV  Anti-ischemic medical therapy: Maximal Therapy (2 or more classes of medications)  Non-Invasive Test Results: No non-invasive testing performed  Prior CABG: No previous CABG        Donata Fryer [facility] 12/20/2023 1:07 PM

## 2023-12-21 ENCOUNTER — Other Ambulatory Visit (HOSPITAL_COMMUNITY): Payer: Self-pay

## 2023-12-21 ENCOUNTER — Observation Stay (HOSPITAL_BASED_OUTPATIENT_CLINIC_OR_DEPARTMENT_OTHER)

## 2023-12-21 ENCOUNTER — Telehealth: Payer: Self-pay | Admitting: Cardiology

## 2023-12-21 ENCOUNTER — Telehealth (HOSPITAL_COMMUNITY): Payer: Self-pay | Admitting: Pharmacy Technician

## 2023-12-21 DIAGNOSIS — E118 Type 2 diabetes mellitus with unspecified complications: Secondary | ICD-10-CM

## 2023-12-21 DIAGNOSIS — E785 Hyperlipidemia, unspecified: Secondary | ICD-10-CM

## 2023-12-21 DIAGNOSIS — E7800 Pure hypercholesterolemia, unspecified: Secondary | ICD-10-CM | POA: Insufficient documentation

## 2023-12-21 DIAGNOSIS — I214 Non-ST elevation (NSTEMI) myocardial infarction: Secondary | ICD-10-CM

## 2023-12-21 DIAGNOSIS — E1122 Type 2 diabetes mellitus with diabetic chronic kidney disease: Secondary | ICD-10-CM | POA: Diagnosis not present

## 2023-12-21 DIAGNOSIS — J449 Chronic obstructive pulmonary disease, unspecified: Secondary | ICD-10-CM | POA: Diagnosis not present

## 2023-12-21 DIAGNOSIS — Z720 Tobacco use: Secondary | ICD-10-CM | POA: Insufficient documentation

## 2023-12-21 DIAGNOSIS — Z8673 Personal history of transient ischemic attack (TIA), and cerebral infarction without residual deficits: Secondary | ICD-10-CM | POA: Diagnosis not present

## 2023-12-21 DIAGNOSIS — I10 Essential (primary) hypertension: Secondary | ICD-10-CM | POA: Insufficient documentation

## 2023-12-21 LAB — HEMOGLOBIN A1C
Hgb A1c MFr Bld: 8 % — ABNORMAL HIGH (ref 4.8–5.6)
Mean Plasma Glucose: 182.9 mg/dL

## 2023-12-21 LAB — ECHOCARDIOGRAM COMPLETE
Area-P 1/2: 3.34 cm2
Calc EF: 72.6 %
Height: 64 in
S' Lateral: 2.4 cm
Single Plane A2C EF: 76.6 %
Single Plane A4C EF: 68.7 %
Weight: 2320 [oz_av]

## 2023-12-21 LAB — LIPID PANEL
Cholesterol: 145 mg/dL (ref 0–200)
HDL: 26 mg/dL — ABNORMAL LOW (ref >40)
LDL Cholesterol: 87 mg/dL (ref 0–99)
Total CHOL/HDL Ratio: 5.6 ratio
Triglycerides: 160 mg/dL — ABNORMAL HIGH (ref <150)
VLDL: 32 mg/dL (ref 0–40)

## 2023-12-21 LAB — CBC
HCT: 41.3 % (ref 36.0–46.0)
Hemoglobin: 13.3 g/dL (ref 12.0–15.0)
MCH: 31.4 pg (ref 26.0–34.0)
MCHC: 32.2 g/dL (ref 30.0–36.0)
MCV: 97.6 fL (ref 80.0–100.0)
Platelets: 297 10*3/uL (ref 150–400)
RBC: 4.23 MIL/uL (ref 3.87–5.11)
RDW: 13.8 % (ref 11.5–15.5)
WBC: 11 10*3/uL — ABNORMAL HIGH (ref 4.0–10.5)
nRBC: 0 % (ref 0.0–0.2)

## 2023-12-21 LAB — BASIC METABOLIC PANEL WITH GFR
Anion gap: 9 (ref 5–15)
BUN: 15 mg/dL (ref 8–23)
CO2: 30 mmol/L (ref 22–32)
Calcium: 8.6 mg/dL — ABNORMAL LOW (ref 8.9–10.3)
Chloride: 100 mmol/L (ref 98–111)
Creatinine, Ser: 1.4 mg/dL — ABNORMAL HIGH (ref 0.44–1.00)
GFR, Estimated: 40 mL/min — ABNORMAL LOW (ref >60)
Glucose, Bld: 166 mg/dL — ABNORMAL HIGH (ref 70–99)
Potassium: 4.7 mmol/L (ref 3.5–5.1)
Sodium: 139 mmol/L (ref 135–145)

## 2023-12-21 MED ORDER — EZETIMIBE 10 MG PO TABS
10.0000 mg | ORAL_TABLET | Freq: Every day | ORAL | 11 refills | Status: DC
  Filled 2023-12-21: qty 30, 30d supply, fill #0

## 2023-12-21 MED ORDER — METOPROLOL SUCCINATE ER 25 MG PO TB24
25.0000 mg | ORAL_TABLET | Freq: Every day | ORAL | 11 refills | Status: DC
  Filled 2023-12-21: qty 30, 30d supply, fill #0

## 2023-12-21 MED ORDER — NITROGLYCERIN 0.4 MG SL SUBL
0.4000 mg | SUBLINGUAL_TABLET | SUBLINGUAL | 2 refills | Status: AC | PRN
  Filled 2023-12-21: qty 25, 7d supply, fill #0

## 2023-12-21 MED ORDER — AMLODIPINE BESYLATE 5 MG PO TABS: 5.0000 mg | ORAL_TABLET | Freq: Every morning | ORAL | Status: DC

## 2023-12-21 MED ORDER — ASPIRIN 81 MG PO TBEC
81.0000 mg | DELAYED_RELEASE_TABLET | Freq: Every day | ORAL | 3 refills | Status: DC
  Filled 2023-12-21: qty 90, 90d supply, fill #0

## 2023-12-21 MED ORDER — EMPAGLIFLOZIN 10 MG PO TABS
10.0000 mg | ORAL_TABLET | Freq: Every day | ORAL | 11 refills | Status: AC
  Filled 2023-12-21: qty 30, 30d supply, fill #0

## 2023-12-21 MED ORDER — CLOPIDOGREL BISULFATE 75 MG PO TABS
75.0000 mg | ORAL_TABLET | Freq: Every day | ORAL | 3 refills | Status: DC
  Filled 2023-12-21: qty 90, 90d supply, fill #0

## 2023-12-21 MED FILL — Nitroglycerin IV Soln 100 MCG/ML in D5W: INTRA_ARTERIAL | Qty: 10 | Status: AC

## 2023-12-21 NOTE — TOC CM/SW Note (Addendum)
 Transition of Care [facility]) - Inpatient Brief Assessment   Patient Details  Name: Paula Hamilton MRN: [id number] Date of Birth: 08-08-1950  Transition of Care [facility]) CM/SW Contact:    Cosimo Diones, RN Phone Number: 12/21/2023, 8:55 AM   Clinical Narrative: Patient presented for chest pain- Nstemi-post LHC. PTA patient was from home with granddaughter and her children. Patient has DME cane and rolling walker. Patient has insurance and PCP and she gets to appointments without any issues. Patient will have transportation home. No home needs identified at this time.  [phone] 12-21-23 Patient in need of oxygen for home. Case Manager discussed agency choices and patient does not have a preference. Referral was submitted to [employer]- DME to be delivered to the room. Patient's address that she will go to is [address] [place] [address]. No further needs identified.     Transition of Care Asessment: Insurance and Status: Insurance coverage has been reviewed Patient has primary care physician: Yes Home environment has been reviewed: reviewed Prior level of function:: independent -has DME cane and RW does not use. Prior/Current Home Services: No current home services Social Drivers of Health Review: SDOH reviewed no interventions necessary Readmission risk has been reviewed: Yes Transition of care needs: no transition of care needs at this time

## 2023-12-21 NOTE — Progress Notes (Signed)
 Mobility Specialist Progress Note;   12/21/23 1510  Mobility  Activity Ambulated with assistance in hallway;Ambulated with assistance to bathroom  Level of Assistance Contact guard assist, steadying assist  Assistive Device None  Distance Ambulated (ft) 100 ft  Activity Response Tolerated well  Mobility Referral Yes  Mobility visit 1 Mobility  Mobility Specialist Start Time (ACUTE ONLY) 1510  Mobility Specialist Stop Time (ACUTE ONLY) 1527  Mobility Specialist Time Calculation (min) (ACUTE ONLY) 17 min    During-mobility: SPO2 92-97%; HR up to 140 bpm then 70s Post-mobility: HR 59 bpm  Pt agreeable to mobility. On 2LO2 upon arrival. Required MinG assistance during ambulation for safety. Pt often reaching out for handrails in hallway for support. Ambulated on 2LO2, SPO2 92-97%. Short bout of tachy, HR up to 140; however quickly recovered to 70s w/ no c/o. Pt returned safely back to bed with all needs met, call bell in reach.   Janit Meline Mobility Specialist Please contact via SecureChat or [employer] [phone]

## 2023-12-21 NOTE — Telephone Encounter (Signed)
 Patient Product/process development scientist completed.    The patient is insured through [employer] [facility] [employer] Patient has Medicare and is not eligible for a copay card, but may be able to apply for patient assistance or Medicare RX Payment Plan (Patient Must reach out to their plan, if eligible for payment plan), if available.    Ran test claim for Farxiga 10 mg and the current 30 day co-pay is $0.00.  Ran test claim for Jardiance 10 mg and the current 30 day co-pay is $0.00.  This test claim was processed through [facility]- copay amounts may vary at other pharmacies due to pharmacy/plan contracts, or as the patient moves through the different stages of their insurance plan.     Roland Earl, CPHT Pharmacy Technician III Certified Patient Advocate [facility] Patient Advocate Team Direct Number: [phone]  Fax: [phone]

## 2023-12-21 NOTE — Discharge Summary (Addendum)
 Discharge Summary   Patient ID: Paula Hamilton MRN: [id number]; DOB: Jun 23, 1951  Admit date: 12/20/2023 Discharge date: 12/21/2023  PCP:  Angelique Barer, MD   [facility] Providers Cardiologist:  Ralene Burger, MD     Discharge Diagnoses  Principal Problem:   NSTEMI (non-ST elevated myocardial infarction) [facility]) Active Problems:   Hyperlipidemia   Tobacco use   Hypertension   Type 2 diabetes mellitus with complication, with long-term current use of insulin  [facility])  Diagnostic Studies/Procedures   Cath: 12/20/2023    Prox RCA to Mid RCA lesion is 45% stenosed.   Prox LAD to Mid LAD lesion is 70% stenosed.   Mid LAD lesion is 90% stenosed.   1st Diag lesion is 95% stenosed.   2nd Diag lesion is 95% stenosed.   Prox Cx to Mid Cx lesion is 70% stenosed.   2nd Mrg lesion is 90% stenosed.   Mid Cx to Dist Cx lesion is 95% stenosed.   A drug-eluting stent was successfully placed using a STENT SYNERGY XD 2.75X20.   A drug-eluting stent was successfully placed using a STENT SYNERGY XD 2.50X16.   in the side branch.   Angioplasty was performed in the side branch. Balloon angioplasty was performed using a BALLOON EMERGE MR 2.0X12.   Post intervention, there is a 0% residual stenosis.   Post intervention, there is a 0% residual stenosis.   Post intervention, there is a 0% residual stenosis.   The left ventricular systolic function is normal.   LV end diastolic pressure is mildly elevated.   The left ventricular ejection fraction is 55-65% by visual estimate.   Recommend uninterrupted dual antiplatelet therapy with Aspirin  81mg  daily and Clopidogrel 75mg  daily for a minimum of 12 months (ACS-Class I recommendation).   Severe complex 2 vessel obstructive CAD Normal LV function LVEDP 16 mm Hg Successful PCI of the mid LAD with DES x 1 Successful PCI of the second OM with DES x 1. Rescue POBA of the AV groove LCx through the stent struts.   Plan: needs aggressive  antianginal medical therapy for her residual CAD. Aggressive risk factor modification.   Diagnostic Dominance: Right  Intervention    Echo: 12/21/2023  IMPRESSIONS     1. Left ventricular ejection fraction, by estimation, is 65 to 70%. The  left ventricle has normal function. The left ventricle has no regional  wall motion abnormalities. There is mild left ventricular hypertrophy.  Left ventricular diastolic parameters  are consistent with Grade I diastolic dysfunction (impaired relaxation).   2. Right ventricular systolic function is low normal. The right  ventricular size is normal. Tricuspid regurgitation signal is inadequate  for assessing PA pressure.   3. The mitral valve is abnormal. Mild mitral valve regurgitation.   4. The aortic valve is tricuspid. Aortic valve regurgitation is not  visualized.   5. The inferior vena cava is normal in size with <50% respiratory  variability, suggesting right atrial pressure of 8 mmHg.   Comparison(s): No prior Echocardiogram.   FINDINGS   Left Ventricle: Left ventricular ejection fraction, by estimation, is 65  to 70%. The left ventricle has normal function. The left ventricle has no  regional wall motion abnormalities. The left ventricular internal cavity  size was normal in size. There is   mild left ventricular hypertrophy. Left ventricular diastolic parameters  are consistent with Grade I diastolic dysfunction (impaired relaxation).  Indeterminate filling pressures.   Right Ventricle: The right ventricular size is normal. No increase in  right ventricular wall thickness. Right ventricular systolic function is  low normal. Tricuspid regurgitation signal is inadequate for assessing PA  pressure.   Left At[facility]: Left atrial size was normal in size.   Right [facility]: Right atrial size was normal in size.   Pericardium: There is no evidence of pericardial effusion.   Mitral Valve: The mitral valve is abnormal. There is mild  calcification of  the anterior and posterior mitral valve leaflet(s). Mild mitral valve  regurgitation.   Tricuspid Valve: The tricuspid valve is grossly normal. Tricuspid valve  regurgitation is trivial.   Aortic Valve: The aortic valve is tricuspid. Aortic valve regurgitation is  not visualized.   Pulmonic Valve: The pulmonic valve was normal in structure. Pulmonic valve  regurgitation is not visualized.   Aorta: The aortic root and ascending aorta are structurally normal, with  no evidence of dilitation.   Venous: The inferior vena cava is normal in size with less than 50%  respiratory variability, suggesting right atrial pressure of 8 mmHg.   IAS/Shunts: No atrial level shunt detected by color flow Doppler  _____________   History of Present Illness   Paula Hamilton is a 73 y.o. female with  a past medical history of diabetes, peripheral vascular disease, history of multiple strokes, ongoing tobacco use, hyperlipidemia, hypertension who is being seen 12/20/2023 for the evaluation of NSTEMI. Per chart review, she does not follow with cardiology.  She presented to [facility] ED on 6/16 complaining of chest pain.  Reported that pain had started the day before and was intermittent.  She woke up on 6/16 with chest pain and decided to go to the ED.  Started on IV heparin and Nitropaste. Chest pain initially resolved with treatment but then returned the evening of 6/16.    Initial EKG at [facility] showed normal sinus rhythm with HR 73 BPM, mild ST depression in V1 and V2, minimal ST elevation in aVL and lead I. Repeat EKG showed normal sinus rhythm, no ST or T wave abnormalities.    Labs in the [facility] ED significant for  - WBC 10.9 - Hemoglobin 15.8 - Platelets 341  - K 3.9 - Creatinine 0.90 - Troponin I 0.50>1.84>4.77   She was treated with IV heparin, started on statin, ASA. She was transported to [facility] for cardiac catheterization. In the cath lab holding area, she reports having  ongoing chest discomfort. Ranks pain a 3-4 out of 10. Relieved with dilaudid . She denies shortness of breath   Hospital Course    NSTEMI  -- Patient presented to [facility] 6/16 with chest pain.  Troponin I 0.50> 1.84> 4.77. Initial EKG at [facility] showed normal sinus rhythm with HR 73 BPM, mild ST depression in V1 and V2, minimal ST elevation in aVL and lead I. Repeat EKG showed normal sinus rhythm, no ST or T wave abnormalities. Overall, presentation was concerning for NSTEMI. She was started on IV heparin and nitropaste, transferred to [facility] for cath  -- Underwent cardiac catheterization 6/17 with severe complex two-vessel obstructive CAD with successful PCI of mid LAD with DES x 1 and PCI of second OM with DES x 1 requiring rescue probe of AV groove circumflex through stent struts.  Does have residual proximal LAD stenosis of 70% with 1st and 2nd diagonal lesions of 95%, 70% proximal circumflex and 45% proximal RCA to be treated medically.  Recommendations for DAPT with aspirin /Plavix for at least 1 year. -- echo showed LVEF of 65-70%, no rWMA, g1dd, normal RA, mild  MR -- no recurrent chest pain, seen by CR -- Continue aspirin , Plavix   Type 2 DM  -- Hgb A1c pending -- treated with SSI while inpatient -- added jardiance prior to discharge   HLD -- multiple statin intolerances, add Zetia 10mg  daily -- plan for lipid clinic referral   HTN -- stable -- continue norvasc  5mg  daily, lisinopril  20mg  daily, Toprol XL 25mg  daily   PVD -- follows through [facility]    History of CVA  -- reports having multiple strokes in the past, previously on monotherapy   Suspected COPD Tobacco use  -- noted to be hypoxic with ambulation, required home O2 -- Counseled on tobacco cessation, reports she is not interested in quitting  General: Well developed, well nourished, female appearing in no acute distress. Head: Normocephalic, atraumatic.  Neck: Supple without bruits, JVD. Lungs:  Resp regular  and unlabored, CTA. Heart: RRR, S1, S2, no S3, S4, or murmur; no rub. Abdomen: Soft, non-tender, non-distended with normoactive bowel sounds. Extremities: No clubbing, cyanosis, edema. Distal pedal pulses are 2+ bilaterally. Right radial cath site stable without bruising or hematoma Neuro: Alert and oriented X 3. Moves all extremities spontaneously. Psych: Normal affect.  Did the patient have an acute coronary syndrome (MI, NSTEMI, STEMI, etc) this admission?:  Yes                               AHA/ACC ACS Clinical Performance & Quality Measures: Aspirin  prescribed? - Yes ADP Receptor Inhibitor (Plavix/Clopidogrel, Brilinta/Ticagrelor or Effient/Prasugrel) prescribed (includes medically managed patients)? - Yes Beta Blocker prescribed? - Yes High Intensity Statin (Lipitor 40-80mg  or Crestor 20-40mg ) prescribed? - No - intolerant EF assessed during THIS hospitalization? - Yes For EF <40%, was ACEI/ARB prescribed? - Not Applicable (EF >/= 40%) For EF <40%, Aldosterone Antagonist (Spironolactone or Eplerenone) prescribed? - Not Applicable (EF >/= 40%) Cardiac Rehab Phase II ordered (including medically managed patients)? - Yes       The patient will be scheduled for a TOC follow up appointment in 10-14 days.  A message has been sent to the [facility] and Scheduling Pool at the office where the patient should be seen for follow up.  _____________  Discharge Vitals Blood pressure (!) 103/59, pulse (!) 56, temperature 98.9 F (37.2 C), temperature source Oral, resp. rate 13, height 5' 4 (1.626 m), weight 65.8 kg, SpO2 (!) 86%.  Filed Weights   12/20/23 1622  Weight: 65.8 kg    Labs & Radiologic Studies  CBC Recent Labs    12/20/23 1821 12/21/23 0333  WBC 12.1* 11.0*  HGB 14.2 13.3  HCT 42.4 41.3  MCV 95.7 97.6  PLT 329 297   Basic Metabolic Panel Recent Labs    47/82/95 1821 12/21/23 0333  NA  --  139  K  --  4.7  CL  --  100  CO2  --  30  GLUCOSE  --  166*  BUN  --  15   CREATININE 0.95 1.40*  CALCIUM  --  8.6*   Liver Function Tests No results for input(s): AST, ALT, ALKPHOS, BILITOT, PROT, ALBUMIN  in the last 72 hours. No results for input(s): LIPASE, AMYLASE in the last 72 hours. High Sensitivity Troponin:   No results for input(s): TROPONINIHS in the last 720 hours.  No results for input(s): TRNPT in the last 720 hours.  BNP Invalid input(s): POCBNP No results for input(s): PROBNP in the last 72 hours.  No results for input(s): BNP in the last 72 hours.  D-Dimer No results for input(s): DDIMER in the last 72 hours. Hemoglobin A1C Recent Labs    12/21/23 0800  HGBA1C 8.0*   Fasting Lipid Panel Recent Labs    12/21/23 0740  CHOL 145  HDL 26*  LDLCALC 87  TRIG 409*  CHOLHDL 5.6   No results found for: LIPOA  Thyroid  Function Tests No results for input(s): TSH, T4TOTAL, T3FREE, THYROIDAB in the last 72 hours.  Invalid input(s): FREET3 _____________  ECHOCARDIOGRAM COMPLETE Result Date: 12/21/2023    ECHOCARDIOGRAM REPORT   Patient Name:   Paula Hamilton Date of Exam: 12/21/2023 Medical Rec #:  [id number]          Height:       64.0 in Accession #:    [id number]         Weight:       145.0 lb Date of Birth:  Feb 12, 1951          BSA:          1.706 m Patient Age:    73 years           BP:           103/59 mmHg Patient Gender: F                  HR:           56 bpm. Exam Location:  Inpatient Procedure: 2D Echo, Cardiac Doppler and Color Doppler (Both Spectral and Color            Flow Doppler were utilized during procedure). Indications:    NSTEMI  History:        Patient has no prior history of Echocardiogram examinations.                 Risk Factors:Hypertension, Diabetes and Current Smoker.  Sonographer:    Janette Medley Referring Phys: [phone] LINDSAY B ROBERTS IMPRESSIONS  1. Left ventricular ejection fraction, by estimation, is 65 to 70%. The left ventricle has normal function. The left ventricle has  no regional wall motion abnormalities. There is mild left ventricular hypertrophy. Left ventricular diastolic parameters are consistent with Grade I diastolic dysfunction (impaired relaxation).  2. Right ventricular systolic function is low normal. The right ventricular size is normal. Tricuspid regurgitation signal is inadequate for assessing PA pressure.  3. The mitral valve is abnormal. Mild mitral valve regurgitation.  4. The aortic valve is tricuspid. Aortic valve regurgitation is not visualized.  5. The inferior vena cava is normal in size with <50% respiratory variability, suggesting right atrial pressure of 8 mmHg. Comparison(s): No prior Echocardiogram. FINDINGS  Left Ventricle: Left ventricular ejection fraction, by estimation, is 65 to 70%. The left ventricle has normal function. The left ventricle has no regional wall motion abnormalities. The left ventricular internal cavity size was normal in size. There is  mild left ventricular hypertrophy. Left ventricular diastolic parameters are consistent with Grade I diastolic dysfunction (impaired relaxation). Indeterminate filling pressures. Right Ventricle: The right ventricular size is normal. No increase in right ventricular wall thickness. Right ventricular systolic function is low normal. Tricuspid regurgitation signal is inadequate for assessing PA pressure. Left [facility]: Left atrial size was normal in size. Right At[facility]: Right atrial size was normal in size. Pericardium: There is no evidence of pericardial effusion. Mitral Valve: The mitral valve is abnormal. There is mild calcification of the anterior and posterior mitral valve leaflet(s). Mild mitral  valve regurgitation. Tricuspid Valve: The tricuspid valve is grossly normal. Tricuspid valve regurgitation is trivial. Aortic Valve: The aortic valve is tricuspid. Aortic valve regurgitation is not visualized. Pulmonic Valve: The pulmonic valve was normal in structure. Pulmonic valve regurgitation is not  visualized. Aorta: The aortic root and ascending aorta are structurally normal, with no evidence of dilitation. Venous: The inferior vena cava is normal in size with less than 50% respiratory variability, suggesting right atrial pressure of 8 mmHg. IAS/Shunts: No atrial level shunt detected by color flow Doppler.  LEFT VENTRICLE PLAX 2D LVIDd:         3.60 cm     Diastology LVIDs:         2.40 cm     LV e' medial:    7.18 cm/s LV PW:         0.90 cm     LV E/e' medial:  12.9 LV IVS:        1.20 cm     LV e' lateral:   6.53 cm/s LVOT diam:     1.60 cm     LV E/e' lateral: 14.2 LV SV:         55 LV SV Index:   32 LVOT Area:     2.01 cm  LV Volumes (MOD) LV vol d, MOD A2C: 73.6 ml LV vol d, MOD A4C: 45.4 ml LV vol s, MOD A2C: 17.2 ml LV vol s, MOD A4C: 14.2 ml LV SV MOD A2C:     56.4 ml LV SV MOD A4C:     45.4 ml LV SV MOD BP:      43.8 ml RIGHT VENTRICLE             IVC RV S prime:     10.70 cm/s  IVC diam: 1.70 cm TAPSE (M-mode): 2.2 cm LEFT ATRIUM             Index        RIGHT ATRIUM           Index LA diam:        2.70 cm 1.58 cm/m   RA Area:     11.00 cm LA Vol (A2C):   41.9 ml 24.55 ml/m  RA Volume:   22.70 ml  13.30 ml/m LA Vol (A4C):   33.1 ml 19.40 ml/m LA Biplane Vol: 37.7 ml 22.09 ml/m  AORTIC VALVE LVOT Vmax:   109.00 cm/s LVOT Vmean:  81.300 cm/s LVOT VTI:    0.274 m  AORTA Ao Root diam: 2.40 cm Ao Asc diam:  3.10 cm MITRAL VALVE MV Area (PHT): 3.34 cm    SHUNTS MV Decel Time: 227 msec    Systemic VTI:  0.27 m MV E velocity: 92.80 cm/s  Systemic Diam: 1.60 cm MV A velocity: 85.40 cm/s MV E/A ratio:  1.09 Dinah Franco MD Electronically signed by Dinah Franco MD Signature Date/Time: 12/21/2023/3:26:55 PM    Final    CARDIAC CATHETERIZATION Result Date: 12/20/2023   Prox RCA to Mid RCA lesion is 45% stenosed.   Prox LAD to Mid LAD lesion is 70% stenosed.   Mid LAD lesion is 90% stenosed.   1st Diag lesion is 95% stenosed.   2nd Diag lesion is 95% stenosed.   Prox Cx to Mid Cx lesion is 70%  stenosed.   2nd Mrg lesion is 90% stenosed.   Mid Cx to Dist Cx lesion is 95% stenosed.   A drug-eluting stent was successfully placed using a STENT SYNERGY  XD 2.75X20.   A drug-eluting stent was successfully placed using a STENT SYNERGY XD 2.50X16.   in the side branch.   Angioplasty was performed in the side branch. Balloon angioplasty was performed using a BALLOON EMERGE MR 2.0X12.   Post intervention, there is a 0% residual stenosis.   Post intervention, there is a 0% residual stenosis.   Post intervention, there is a 0% residual stenosis.   The left ventricular systolic function is normal.   LV end diastolic pressure is mildly elevated.   The left ventricular ejection fraction is 55-65% by visual estimate.   Recommend uninterrupted dual antiplatelet therapy with Aspirin  81mg  daily and Clopidogrel 75mg  daily for a minimum of 12 months (ACS-Class I recommendation). Severe complex 2 vessel obstructive CAD Normal LV function LVEDP 16 mm Hg Successful PCI of the mid LAD with DES x 1 Successful PCI of the second OM with DES x 1. Rescue POBA of the AV groove LCx through the stent struts. Plan: needs aggressive antianginal medical therapy for her residual CAD. Aggressive risk factor modification.    Disposition Pt is being discharged home today in good condition.  Follow-up Plans & Appointments  Discharge Instructions     AMB Referral to [facility] Pharm[facility]   Complete by: As directed    Post ACS- stat   Reason For Referral: Lipids   Amb Referral to Cardiac Rehabilitation   Complete by: As directed    Diagnosis:  Coronary Stents NSTEMI     After initial evaluation and assessments completed: Virtual Based Care may be provided alone or in conjunction with Phase 2 Cardiac Rehab based on patient barriers.: Yes   Intensive Cardiac Rehabilitation (ICR) [facility] location only OR Traditional Cardiac Rehabilitation (TCR) *If criteria for ICR are not met will enroll in TCR [facility] only): Yes   Call MD for:  difficulty  breathing, headache or visual disturbances   Complete by: As directed    Call MD for:  persistant dizziness or light-headedness   Complete by: As directed    Call MD for:  redness, tenderness, or signs of infection (pain, swelling, redness, odor or green/yellow discharge around incision site)   Complete by: As directed    Diet - low sodium heart healthy   Complete by: As directed    Discharge instructions   Complete by: As directed    Radial Site Care Refer to this sheet in the next few weeks. These instructions provide you with information on caring for yourself after your procedure. Your caregiver may also give you more specific instructions. Your treatment has been planned according to current medical practices, but problems sometimes occur. Call your caregiver if you have any problems or questions after your procedure. HOME CARE INSTRUCTIONS You may shower the day after the procedure. Remove the bandage (dressing) and gently wash the site with plain soap and water. Gently pat the site dry.  Do not apply powder or lotion to the site.  Do not submerge the affected site in water for 3 to 5 days.  Inspect the site at least twice daily.  Do not flex or bend the affected arm for 24 hours.  No lifting over 5 pounds (2.3 kg) for 5 days after your procedure.  Do not drive home if you are discharged the same day of the procedure. Have someone else drive you.  You may drive 24 hours after the procedure unless otherwise instructed by your caregiver.  What to expect: Any bruising will usually fade within 1 to  2 weeks.  Blood that collects in the tissue (hematoma) may be painful to the touch. It should usually decrease in size and tenderness within 1 to 2 weeks.  SEEK IMMEDIATE MEDICAL CARE IF: You have unusual pain at the radial site.  You have redness, warmth, swelling, or pain at the radial site.  You have drainage (other than a small amount of blood on the dressing).  You have chills.  You  have a fever or persistent symptoms for more than 72 hours.  You have a fever and your symptoms suddenly get worse.  Your arm becomes pale, cool, tingly, or numb.  You have heavy bleeding from the site. Hold pressure on the site.   PLEASE DO NOT MISS ANY DOSES OF YOUR PLAVIX!!!!! Also keep a log of you blood pressures and bring back to your follow up appt. Please call the office with any questions.   Patients taking blood thinners should generally stay away from medicines like ibuprofen, Advil, Motrin, naproxen, and Aleve due to risk of stomach bleeding. You may take Tylenol  as directed or talk to your primary doctor about alternatives.   PLEASE ENSURE THAT YOU DO NOT RUN OUT OF YOUR PLAVIX. This medication is very important to remain on for at least one year. IF you have issues obtaining this medication due to cost please CALL the office 3-5 business days prior to running out in order to prevent missing doses of this medication.   For home use only DME oxygen   Complete by: As directed    Length of Need: 6 Months   Mode or (Route): Nasal cannula   Liters per Minute: 2   Oxygen delivery system: Gas   Increase activity slowly   Complete by: As directed    Pulmonary Visit   Complete by: As directed    Reason for referral: Other Pulmonary       Discharge Medications Allergies as of 12/21/2023       Reactions   Codeine Rash, Swelling   Penicillins Rash   PATIENT HAS HAD A PCN REACTION WITH IMMEDIATE RASH, FACIAL/TONGUE/THROAT SWELLING, SOB, OR LIGHTHEADEDNESS WITH HYPOTENSION:  #  #  #  YES  #  #  #   Has patient had a PCN reaction causing severe rash involving mucus membranes or skin necrosis: No Has patient had a PCN reaction that required hospitalization: No Has patient had a PCN reaction occurring within the last 10 years: No   Pregabalin  Other (See Comments)   Suicidal thoughts, extreme depression   Atorvastatin    Doxycycline Swelling   Metformin Other (See Comments)    Profound muscle weakness whren mixed w/actos or januvia   Erythromycin Base Nausea And Vomiting   STOMACH PAIN   Morphine  And Codeine Nausea And Vomiting   Has taken in pill form w/o side effects        Medication List     STOP taking these medications    hydrochlorothiazide  12.5 MG capsule Commonly known as: MICROZIDE        TAKE these medications    amLODipine  5 MG tablet Commonly known as: NORVASC  Take 1 tablet (5 mg total) by mouth in the morning. What changed:  medication strength how much to take   aspirin  EC 81 MG tablet Take 1 tablet (81 mg total) by mouth daily. Swallow whole. Start taking on: December 22, 2023   clopidogrel 75 MG tablet Commonly known as: PLAVIX Take 1 tablet (75 mg total) by mouth daily with breakfast.  Start taking on: December 22, 2023   diphenhydrAMINE  25 mg capsule Commonly known as: BENADRYL  Take 25 mg by mouth at bedtime.   diphenoxylate -atropine  2.5-0.025 MG tablet Commonly known as: Lomotil  Take 1 tablet by mouth 3 (three) times daily as needed for diarrhea or loose stools.   empagliflozin 10 MG Tabs tablet Commonly known as: Jardiance Take 1 tablet (10 mg total) by mouth daily before breakfast.   ezetimibe 10 MG tablet Commonly known as: Zetia Take 1 tablet (10 mg total) by mouth daily.   Lantus  SoloStar 100 UNIT/ML Solostar Pen Generic drug: insulin  glargine Inject 40 Units into the skin in the morning.   levETIRAcetam  500 MG tablet Commonly known as: KEPPRA  Take 1 tablet (500 mg total) by mouth 2 (two) times daily.   lisinopril  20 MG tablet Commonly known as: ZESTRIL  Take 20 mg by mouth in the morning.   methocarbamol  500 MG tablet Commonly known as: ROBAXIN  Take 1 tablet (500 mg total) by mouth every 6 (six) hours as needed for muscle spasms.   metoprolol succinate 25 MG 24 hr tablet Commonly known as: TOPROL-XL Take 1 tablet (25 mg total) by mouth daily. Start taking on: December 22, 2023   nitroGLYCERIN 0.4 MG SL  tablet Commonly known as: NITROSTAT Place 1 tablet (0.4 mg total) under the tongue every 5 (five) minutes as needed.               Durable Medical Equipment  (From admission, onward)           Start     Ordered   12/21/23 0000  For home use only DME oxygen       Question Answer Comment  Length of Need 6 Months   Mode or (Route) Nasal cannula   Liters per Minute 2   Oxygen delivery system Gas      12/21/23 1614             Outstanding Labs/Studies  N/a  Duration of Discharge Encounter: APP Time: 25 minutes   Signed, Johnie Nailer, NP 12/21/2023, 4:15 PM   ATTENDING ATTESTATION:  After conducting a review of all available clinical information with the care team, interviewing the patient, and performing a physical exam, I agree with the findings and plan described in this note.   GEN: No acute distress.   HEENT:  MMM, no JVD, no scleral icterus Cardiac: RRR, no murmurs, rubs, or gallops.  Respiratory: Clear to auscultation bilaterally. GI: Soft, nontender, non-distended  MS: No edema; No deformity. Neuro:  Nonfocal  Vasc:  +2 radial pulses; mild to moderate R radial bruising  Patient doing well after uncomplicated PCI of mid to distal LAD and obtuse marginal.  There is residual modest disease of the proximal LAD and circumflex seen.  I reviewed the angiographic images.  The patient remains chest pain-free overnight.  She has mild to moderate bruising of her right radial site but no sizable hematoma.  She is neurologically intact.  I reviewed the need for antiplatelet therapy, close hospital follow-up, and goal-directed medical therapy. Her echocardiogram today shows preserved biventricular function without serious valve issues.  She does not demonstrate any signs or symptoms of heart failure.  If reassuring will discharge today on dual antiplatelet therapy with aspirin  and Plavix.  The patient will be seen in lipid clinic given her statin  intolerance.   APP discharge time:25 MD discharge time:30  Alyssa Backbone, MD Pager [phone]

## 2023-12-21 NOTE — Progress Notes (Signed)
 SATURATION QUALIFICATIONS: (This note is used to comply with regulatory documentation for home oxygen)  Patient Saturations on Room Air at Rest = 92%  Patient Saturations on Room Air while Ambulating = 86%  Patient Saturations on 2 Liters of oxygen while Ambulating = 91%  Please briefly explain why patient needs home oxygen: Patient while resting on RA sats were 92. While walking 30 feet, patient became sob with o2 of 86. While resting on 2L, patient recovered above 92

## 2023-12-21 NOTE — Telephone Encounter (Signed)
   Transition of Care Follow-up Phone Call Request    Patient Name: Paula Hamilton Date of Birth: [birth year] Date of Encounter: 12/21/2023  Primary Care Provider:  Angelique Barer, MD Primary Cardiologist:  Ralene Burger, MD  Paula Hamilton has been scheduled for a transition of care follow up appointment with a [facility] provider:  Pattricia Bores 6/25  Please reach out to Paula Hamilton within 48 hours of discharge to confirm appointment and review transition of care protocol questionnaire. Anticipated discharge date: 6/18  Johnie Nailer, NP  12/21/2023, 10:31 AM

## 2023-12-21 NOTE — Progress Notes (Signed)
 CARDIAC REHAB PHASE I     Pt resting in bed, feeling well today. Discussed mobility needs with pt. Reports difficulty ambulating at home. Will ask RN to address ambulating and evaluate for recurrent symptoms. Post MI/stent education including restrictions, risk factors, exercise guidelines, antiplatelet therapy importance, MI booklet, NTG use, heart healthy diabetic diet, smoking cessation and CRP2 reviewed. All questions and concerns addressed. Will refer to [facility] for CRP2. Will continue to follow.   [id number] Ronny Colas, RN BSN 12/21/2023 9:22 AM

## 2023-12-21 NOTE — Telephone Encounter (Signed)
 Pt is currently admitted Will attempt TOC call 6/19

## 2023-12-22 ENCOUNTER — Other Ambulatory Visit (HOSPITAL_COMMUNITY): Payer: Self-pay

## 2023-12-22 NOTE — Telephone Encounter (Signed)
 Patient contacted regarding discharge from [facility] on 12/21/23.  Patient understands to follow up with provider Pattricia Bores, NP on 12/28/23 at 10:55 am at [facility] location. Patient understands discharge instructions? yes Patient understands medications and regiment? yes Patient understands to bring all medications to this visit? yes

## 2023-12-23 LAB — LIPOPROTEIN A (LPA): Lipoprotein (a): 32.1 nmol/L — ABNORMAL HIGH (ref <75.0)

## 2023-12-27 NOTE — Progress Notes (Signed)
 " Cardiology Office Note   Date:  12/28/2023  ID:  Venba, Zenner Nov 05, 1950, MRN [id number] PCP: Jama Chow, MD  [facility] Providers Cardiologist:  Lamar Fitch, MD     History of Present Illness Paula Hamilton is a 73 y.o. female with a past medical history of hypertension, CAD, DM 2, chronic pain syndrome, dyslipidemia, tobacco use, PVD, history of strokes.  12/21/2023 echo EF 65 to 70%, mild LVH, grade 1 DD, mild MR 12/20/2023 left heart cath severe complex two-vessel obstructive CAD, PCI of mid LAD with DES x 1, PCI of second OM with DES x 1 >> DAPT x 12 months  She presented to [facility] ED on 12/19/2023 with complaints of chest pain, EKG revealed mild ST depression in V1 and V2 with minimal elevation in aVL and lead I, 0.50>1.84>4.77.  She was subsequently transferred to [facility] for concerns of non-STEMI.  She underwent cardiac cath on 12/20/2023 with severe two-vessel obstructive CAD with successful PCI of mid LAD DES x 1, PCI of second OM with DES x 1 requiring rescue probe of AV groove circumflex through stent strut, with residual proximal LAD stenosis of 70%, 1st and 2nd diagonal lesion 95%, 70% proximal circumflex, 45% proximal RCA with recommendations for medical treatment.  She presents today accompanied by her significant other for follow-up after recent left heart cath as outlined above.  She is doing okay, still having some episodes of occasional chest pain but has not needed to use her nitroglycerin .  She is trying to increase her physical activity.  She has cut down on her tobacco use however been struggling with cessation. She denies  palpitations, dyspnea, pnd, orthopnea, n, v, dizziness, syncope, edema, weight gain, or early satiety.     ROS: Review of Systems  Cardiovascular:  Positive for chest pain.  Endo/Heme/Allergies:  Bruises/bleeds easily.  All other systems reviewed and are negative.    Studies Reviewed      Cardiac  Studies & Procedures   ______________________________________________________________________________________________ CARDIAC CATHETERIZATION  CARDIAC CATHETERIZATION 12/20/2023  Conclusion   Prox RCA to Mid RCA lesion is 45% stenosed.   Prox LAD to Mid LAD lesion is 70% stenosed.   Mid LAD lesion is 90% stenosed.   1st Diag lesion is 95% stenosed.   2nd Diag lesion is 95% stenosed.   Prox Cx to Mid Cx lesion is 70% stenosed.   2nd Mrg lesion is 90% stenosed.   Mid Cx to Dist Cx lesion is 95% stenosed.   A drug-eluting stent was successfully placed using a STENT SYNERGY XD 2.75X20.   A drug-eluting stent was successfully placed using a STENT SYNERGY XD 2.50X16.   in the side branch.   Angioplasty was performed in the side branch. Balloon angioplasty was performed using a BALLOON EMERGE MR 2.0X12.   Post intervention, there is a 0% residual stenosis.   Post intervention, there is a 0% residual stenosis.   Post intervention, there is a 0% residual stenosis.   The left ventricular systolic function is normal.   LV end diastolic pressure is mildly elevated.   The left ventricular ejection fraction is 55-65% by visual estimate.   Recommend uninterrupted dual antiplatelet therapy with Aspirin  81mg  daily and Clopidogrel  75mg  daily for a minimum of 12 months (ACS-Class I recommendation).  Severe complex 2 vessel obstructive CAD Normal LV function LVEDP 16 mm Hg Successful PCI of the mid LAD with DES x 1 Successful PCI of the second OM with DES  x 1. Rescue POBA of the AV groove LCx through the stent struts.  Plan: needs aggressive antianginal medical therapy for her residual CAD. Aggressive risk factor modification.  Findings Coronary Findings Diagnostic  Dominance: Right  Left Anterior Descending Prox LAD to Mid LAD lesion is 70% stenosed. Mid LAD lesion is 90% stenosed.  First Diagonal Branch 1st Diag lesion is 95% stenosed.  Second Diagonal Branch 2nd Diag lesion is 95%  stenosed.  Left Circumflex Prox Cx to Mid Cx lesion is 70% stenosed. Mid Cx to Dist Cx lesion is 95% stenosed.  First Obtuse Marginal Branch Vessel is small in size.  Second Obtuse Marginal Branch 2nd Mrg lesion is 90% stenosed.  Right Coronary Artery Prox RCA to Mid RCA lesion is 45% stenosed. The lesion is severely calcified.  Intervention  Mid LAD lesion Stent CATH VISTA GUIDE 6FR XB3.5 EPK guide catheter was inserted. Lesion crossed with guidewire using a WIRE ASAHI PROWATER 180CM. Pre-stent angioplasty was performed using a BALLOON EMERGE MR 2.0X12. A drug-eluting stent was successfully placed using a STENT SYNERGY XD 2.75X20. Maximum pressure: 12 atm. Stent strut is well apposed. Post-stent angioplasty was not performed. Post-Intervention Lesion Assessment The intervention was successful. Pre-interventional TIMI flow is 3. Post-intervention TIMI flow is 3. No complications occurred at this lesion. There is a 0% residual stenosis post intervention.  Mid Cx to Dist Cx lesion Angioplasty Angioplasty was performed in the side branch. Balloon angioplasty was performed using a BALLOON EMERGE MR 2.0X12. Maximum pressure: 12 atm. Post-Intervention Lesion Assessment The intervention was successful. Pre-interventional TIMI flow is 3. Post-intervention TIMI flow is 3. No complications occurred at this lesion. There is a 0% residual stenosis post intervention.  2nd Mrg lesion Stent CATH VISTA GUIDE 6FR XB3.5 EPK guide catheter was inserted. Lesion crossed with guidewire using a WIRE ASAHI PROWATER 180CM. Pre-stent angioplasty was performed using a BALLOON EMERGE MR 2.0X12. A drug-eluting stent was successfully placed using a STENT SYNERGY XD 2.50X16. Maximum pressure: 12 atm. Stent strut is well apposed. Post-stent angioplasty was not performed. Post-Intervention Lesion Assessment The intervention was successful. Pre-interventional TIMI flow is 3. Post-intervention TIMI flow is 3. No  complications occurred at this lesion. There is a 0% residual stenosis post intervention.     ECHOCARDIOGRAM  ECHOCARDIOGRAM COMPLETE 12/21/2023  Narrative ECHOCARDIOGRAM REPORT    Patient Name:   Paula Hamilton Date of Exam: 12/21/2023 Medical Rec #:  [id number]          Height:       64.0 in Accession #:    [id number]         Weight:       145.0 lb Date of Birth:  Aug 02, 1950          BSA:          1.706 m Patient Age:    73 years           BP:           103/59 mmHg Patient Gender: F                  HR:           56 bpm. Exam Location:  Inpatient  Procedure: 2D Echo, Cardiac Doppler and Color Doppler (Both Spectral and Color Flow Doppler were utilized during procedure).  Indications:    NSTEMI  History:        Patient has no prior history of Echocardiogram examinations. Risk Factors:Hypertension, Diabetes and Current Smoker.  Sonographer:  Philomena Daring Referring Phys: [phone] LINDSAY B ROBERTS  IMPRESSIONS   1. Left ventricular ejection fraction, by estimation, is 65 to 70%. The left ventricle has normal function. The left ventricle has no regional wall motion abnormalities. There is mild left ventricular hypertrophy. Left ventricular diastolic parameters are consistent with Grade I diastolic dysfunction (impaired relaxation). 2. Right ventricular systolic function is low normal. The right ventricular size is normal. Tricuspid regurgitation signal is inadequate for assessing PA pressure. 3. The mitral valve is abnormal. Mild mitral valve regurgitation. 4. The aortic valve is tricuspid. Aortic valve regurgitation is not visualized. 5. The inferior vena cava is normal in size with <50% respiratory variability, suggesting right atrial pressure of 8 mmHg.  Comparison(s): No prior Echocardiogram.  FINDINGS Left Ventricle: Left ventricular ejection fraction, by estimation, is 65 to 70%. The left ventricle has normal function. The left ventricle has no regional wall motion  abnormalities. The left ventricular internal cavity size was normal in size. There is mild left ventricular hypertrophy. Left ventricular diastolic parameters are consistent with Grade I diastolic dysfunction (impaired relaxation). Indeterminate filling pressures.  Right Ventricle: The right ventricular size is normal. No increase in right ventricular wall thickness. Right ventricular systolic function is low normal. Tricuspid regurgitation signal is inadequate for assessing PA pressure.  Left Atrium: Left atrial size was normal in size.  Right Atrium: Right atrial size was normal in size.  Pericardium: There is no evidence of pericardial effusion.  Mitral Valve: The mitral valve is abnormal. There is mild calcification of the anterior and posterior mitral valve leaflet(s). Mild mitral valve regurgitation.  Tricuspid Valve: The tricuspid valve is grossly normal. Tricuspid valve regurgitation is trivial.  Aortic Valve: The aortic valve is tricuspid. Aortic valve regurgitation is not visualized.  Pulmonic Valve: The pulmonic valve was normal in structure. Pulmonic valve regurgitation is not visualized.  Aorta: The aortic root and ascending aorta are structurally normal, with no evidence of dilitation.  Venous: The inferior vena cava is normal in size with less than 50% respiratory variability, suggesting right atrial pressure of 8 mmHg.  IAS/Shunts: No atrial level shunt detected by color flow Doppler.   LEFT VENTRICLE PLAX 2D LVIDd:         3.60 cm     Diastology LVIDs:         2.40 cm     LV e' medial:    7.18 cm/s LV PW:         0.90 cm     LV E/e' medial:  12.9 LV IVS:        1.20 cm     LV e' lateral:   6.53 cm/s LVOT diam:     1.60 cm     LV E/e' lateral: 14.2 LV SV:         55 LV SV Index:   32 LVOT Area:     2.01 cm  LV Volumes (MOD) LV vol d, MOD A2C: 73.6 ml LV vol d, MOD A4C: 45.4 ml LV vol s, MOD A2C: 17.2 ml LV vol s, MOD A4C: 14.2 ml LV SV MOD A2C:     56.4  ml LV SV MOD A4C:     45.4 ml LV SV MOD BP:      43.8 ml  RIGHT VENTRICLE             IVC RV S prime:     10.70 cm/s  IVC diam: 1.70 cm TAPSE (M-mode): 2.2 cm  LEFT ATRIUM  Index        RIGHT ATRIUM           Index LA diam:        2.70 cm 1.58 cm/m   RA Area:     11.00 cm LA Vol (A2C):   41.9 ml 24.55 ml/m  RA Volume:   22.70 ml  13.30 ml/m LA Vol (A4C):   33.1 ml 19.40 ml/m LA Biplane Vol: 37.7 ml 22.09 ml/m AORTIC VALVE LVOT Vmax:   109.00 cm/s LVOT Vmean:  81.300 cm/s LVOT VTI:    0.274 m  AORTA Ao Root diam: 2.40 cm Ao Asc diam:  3.10 cm  MITRAL VALVE MV Area (PHT): 3.34 cm    SHUNTS MV Decel Time: 227 msec    Systemic VTI:  0.27 m MV E velocity: 92.80 cm/s  Systemic Diam: 1.60 cm MV A velocity: 85.40 cm/s MV E/A ratio:  1.09  Vinie Maxcy MD Electronically signed by Vinie Maxcy MD Signature Date/Time: 12/21/2023/3:26:55 PM    Final          ______________________________________________________________________________________________      Risk Assessment/Calculations           Physical Exam VS:  BP 120/72   Pulse 63   Ht 5' 4 (1.626 m)   Wt 146 lb (66.2 kg)   SpO2 90%   BMI 25.06 kg/m        Wt Readings from Last 3 Encounters:  12/28/23 146 lb (66.2 kg)  12/20/23 145 lb (65.8 kg)  05/11/23 152 lb (68.9 kg)    GEN: Well nourished, well developed in no acute distress NECK: No JVD; No carotid bruits CARDIAC: RRR, no murmurs, rubs, gallops RESPIRATORY:  Clear to auscultation without rales, wheezing or rhonchi  ABDOMEN: Soft, non-tender, non-distended EXTREMITIES:  No edema; No deformity  SKIN: hematoma noted, + 2 pulses to and proximal from insertion site  ASSESSMENT AND PLAN CAD - severe two-vessel obstructive CAD with successful PCI of mid LAD DES x 1, PCI of second OM with DES x 1 requiring rescue probe of AV groove circumflex through stent strut, with residual proximal LAD stenosis of 70%, 1st and 2nd diagonal lesion  95%, 70% proximal circumflex, 45% proximal RCA with recommendations for medical treatment. Ready for cardiac rehab, will refer to cardiac rehab at [facility]. Continue 81 mg daily, Plavix  75 mg daily, Zetia  10 mg daily, metoprolol  25 mg daily.  Dyslipidemia with statin intolerance- LDL 87 12/21/2023, LP(a) 32.1, she has been intolerant of Lipitor, feels she has tried other statins as well as not amenable to trying  other statins at this time.  She would like to try fenofibrate, will repeat CMET with plans of starting fenofibrate.  Tobacco abuse-currently smoking 3 packs/day, down to 1 and half packs, amenable to stopping, will arrange for Nicoderm.     PAD - follows with [facility] in [facility].  DM2 - A1C not well controlled. Heart healthy diet and regular cardiovascular exercise encouraged.      Dispo: Nicoderm to help with smoking cessation, CBC, CMET, refer to cardiac rehab. Follow up with Dr. Krasowski in 4 weeks.   Signed, Delon JAYSON Hoover, NP  "

## 2023-12-28 ENCOUNTER — Ambulatory Visit: Attending: Cardiology | Admitting: Cardiology

## 2023-12-28 ENCOUNTER — Encounter: Payer: Self-pay | Admitting: Cardiology

## 2023-12-28 VITALS — BP 120/72 | HR 63 | Ht 64.0 in | Wt 146.0 lb

## 2023-12-28 DIAGNOSIS — I10 Essential (primary) hypertension: Secondary | ICD-10-CM | POA: Diagnosis not present

## 2023-12-28 DIAGNOSIS — E782 Mixed hyperlipidemia: Secondary | ICD-10-CM

## 2023-12-28 DIAGNOSIS — Q209 Congenital malformation of cardiac chambers and connections, unspecified: Secondary | ICD-10-CM | POA: Insufficient documentation

## 2023-12-28 DIAGNOSIS — Z720 Tobacco use: Secondary | ICD-10-CM | POA: Diagnosis not present

## 2023-12-28 DIAGNOSIS — R29898 Other symptoms and signs involving the musculoskeletal system: Secondary | ICD-10-CM | POA: Insufficient documentation

## 2023-12-28 DIAGNOSIS — I2510 Atherosclerotic heart disease of native coronary artery without angina pectoris: Secondary | ICD-10-CM

## 2023-12-28 DIAGNOSIS — E1151 Type 2 diabetes mellitus with diabetic peripheral angiopathy without gangrene: Secondary | ICD-10-CM

## 2023-12-28 DIAGNOSIS — S32009K Unspecified fracture of unspecified lumbar vertebra, subsequent encounter for fracture with nonunion: Secondary | ICD-10-CM | POA: Insufficient documentation

## 2023-12-28 DIAGNOSIS — I214 Non-ST elevation (NSTEMI) myocardial infarction: Secondary | ICD-10-CM

## 2023-12-28 MED ORDER — NICOTINE 14 MG/24HR TD PT24: 14.0000 mg | MEDICATED_PATCH | Freq: Every day | TRANSDERMAL | 0 refills | Status: DC

## 2023-12-28 MED ORDER — NICOTINE 7 MG/24HR TD PT24: 7.0000 mg | MEDICATED_PATCH | Freq: Every day | TRANSDERMAL | 0 refills | Status: DC

## 2023-12-28 MED ORDER — NICOTINE 21 MG/24HR TD PT24: 21.0000 mg | MEDICATED_PATCH | Freq: Every day | TRANSDERMAL | 0 refills | Status: DC

## 2023-12-28 NOTE — Patient Instructions (Addendum)
 Medication Instructions:  Your physician has recommended you make the following change in your medication:   START: Nicotine patch as directed  *If you need a refill on your cardiac medications before your next appointment, please call your pharmacy*  Lab Work: Your physician recommends that you return for lab work in:   Labs today:CMP, CBC  If you have labs (blood work) drawn today and your tests are completely normal, you will receive your results only by: MyChart Message (if you have MyChart) OR A paper copy in the mail If you have any lab test that is abnormal or we need to change your treatment, we will call you to review the results.  Testing/Procedures: None  Follow-Up: At [facility], you and your health needs are our priority.  As part of our continuing mission to provide you with exceptional heart care, our providers are all part of one team.  This team includes your primary Cardiologist (physician) and Advanced Practice Providers or APPs (Physician Assistants and Nurse Practitioners) who all work together to provide you with the care you need, when you need it.  Your next appointment:   4 week(s)  Provider:   Lamar Fitch, MD    We recommend signing up for the patient portal called MyChart.  Sign up information is provided on this After Visit Summary.  MyChart is used to connect with patients for Virtual Visits (Telemedicine).  Patients are able to view lab/test results, encounter notes, upcoming appointments, etc.  Non-urgent messages can be sent to your provider as well.   To learn more about what you can do with MyChart, go to [email or web address]  Other Instructions Cardiac Rehab at [facility] at [place], any provider will take excellent care of you!   [address], [place], [place] [address] [phone]

## 2023-12-29 ENCOUNTER — Ambulatory Visit: Payer: Self-pay | Admitting: Cardiology

## 2023-12-29 LAB — CBC
Hematocrit: 45.2 % (ref 34.0–46.6)
Hemoglobin: 13.9 g/dL (ref 11.1–15.9)
MCH: 30.9 pg (ref 26.6–33.0)
MCHC: 30.8 g/dL — ABNORMAL LOW (ref 31.5–35.7)
MCV: 100 fL — ABNORMAL HIGH (ref 79–97)
Platelets: 411 x10E3/uL (ref 150–450)
RBC: 4.5 x10E6/uL (ref 3.77–5.28)
RDW: 13.3 % (ref 11.7–15.4)
WBC: 11.6 x10E3/uL — ABNORMAL HIGH (ref 3.4–10.8)

## 2023-12-29 LAB — COMPREHENSIVE METABOLIC PANEL WITH GFR
ALT: 7 IU/L (ref 0–32)
AST: 13 IU/L (ref 0–40)
Albumin: 4 g/dL (ref 3.8–4.8)
Alkaline Phosphatase: 109 IU/L (ref 44–121)
BUN/Creatinine Ratio: 21 (ref 12–28)
BUN: 30 mg/dL — ABNORMAL HIGH (ref 8–27)
Bilirubin Total: <0.2 mg/dL (ref 0.0–1.2)
CO2: 24 mmol/L (ref 20–29)
Calcium: 9.6 mg/dL (ref 8.7–10.3)
Chloride: 102 mmol/L (ref 96–106)
Creatinine, Ser: 1.4 mg/dL — ABNORMAL HIGH (ref 0.57–1.00)
Globulin, Total: 2.4 g/dL (ref 1.5–4.5)
Glucose: 148 mg/dL — ABNORMAL HIGH (ref 70–99)
Potassium: 5.2 mmol/L (ref 3.5–5.2)
Sodium: 144 mmol/L (ref 134–144)
Total Protein: 6.4 g/dL (ref 6.0–8.5)
eGFR: 40 mL/min/1.73 — ABNORMAL LOW

## 2023-12-30 ENCOUNTER — Other Ambulatory Visit: Payer: Self-pay

## 2023-12-30 ENCOUNTER — Other Ambulatory Visit (HOSPITAL_COMMUNITY): Payer: Self-pay

## 2023-12-30 ENCOUNTER — Telehealth: Payer: Self-pay | Admitting: Pharmacy Technician

## 2023-12-30 DIAGNOSIS — E782 Mixed hyperlipidemia: Secondary | ICD-10-CM

## 2023-12-30 MED ORDER — NEXLIZET 180-10 MG PO TABS: 1.0000 | ORAL_TABLET | Freq: Every day | ORAL | 3 refills | Status: DC

## 2023-12-30 NOTE — Telephone Encounter (Signed)
 Pharmacy Patient Advocate Encounter   Received notification from CoverMyMeds that prior authorization for Nexlizet is required/requested.   Insurance verification completed.   The patient is insured through [employer] .   Per test claim: PA required; PA submitted to above mentioned insurance via CoverMyMeds Key/confirmation #/EOC [id number] Status is pending

## 2023-12-30 NOTE — Telephone Encounter (Signed)
 Pharmacy Patient Advocate Encounter  Received notification from [facility] that Prior Authorization for nexlizet has been APPROVED from 12/30/23 to 06/30/24. Ran test claim, Copay is $0.00- 3 months. This test claim was processed through [facility]- copay amounts may vary at other pharmacies due to pharmacy/plan contracts, or as the patient moves through the different stages of their insurance plan.   PA #/Case ID/Reference #: [id number]

## 2024-01-02 ENCOUNTER — Telehealth (HOSPITAL_COMMUNITY): Payer: Self-pay

## 2024-01-02 NOTE — Telephone Encounter (Signed)
Per phase I cardiac rehab, fax referral to [place].

## 2024-01-03 ENCOUNTER — Encounter: Payer: Self-pay | Admitting: Gastroenterology

## 2024-01-03 ENCOUNTER — Ambulatory Visit (INDEPENDENT_AMBULATORY_CARE_PROVIDER_SITE_OTHER): Admitting: Gastroenterology

## 2024-01-03 VITALS — BP 130/68 | HR 62 | Ht 63.0 in | Wt 146.0 lb

## 2024-01-03 DIAGNOSIS — K5900 Constipation, unspecified: Secondary | ICD-10-CM | POA: Diagnosis not present

## 2024-01-03 DIAGNOSIS — Z860100 Personal history of colon polyps, unspecified: Secondary | ICD-10-CM

## 2024-01-03 DIAGNOSIS — Z87898 Personal history of other specified conditions: Secondary | ICD-10-CM | POA: Diagnosis not present

## 2024-01-03 DIAGNOSIS — R1030 Lower abdominal pain, unspecified: Secondary | ICD-10-CM

## 2024-01-03 NOTE — Patient Instructions (Signed)
 _______________________________________________________  If your blood pressure at your visit was 140/90 or greater, please contact your primary care physician to follow up on this.  _______________________________________________________  If you are age 71 or older, your body mass index should be between 23-30. Your Body mass index is 25.86 kg/m. If this is out of the aforementioned range listed, please consider follow up with your Primary Care Provider.  If you are age 48 or younger, your body mass index should be between 19-25. Your Body mass index is 25.86 kg/m. If this is out of the aformentioned range listed, please consider follow up with your Primary Care Provider.   ________________________________________________________  The [facility] providers would like to encourage you to use MYCHART to communicate with providers for non-urgent requests or questions.  Due to long hold times on the telephone, sending your provider a message by [facility] may be a faster and more efficient way to get a response.  Please allow 48 business hours for a response.  Please remember that this is for non-urgent requests.  _______________________________________________________  Lawerance 1 tablespoon daily   Please follow up in 6 months. Give us  a call at [phone] to schedule an appointment.   You have been scheduled for a CT scan of the abdomen and pelvis at [facility] (Address [address] [place], [place], [place] [address].   You are scheduled on 01-12-24 at 11am. You should arrive by 845am your appointment time for registration. Please follow the written instructions below on the day of your exam:  WARNING: IF YOU ARE ALLERGIC TO IODINE/X-RAY DYE, PLEASE NOTIFY RADIOLOGY IMMEDIATELY AT [phone]! YOU WILL BE GIVEN A 13 HOUR PREMEDICATION PREP.  1) Do not eat or drink anything after 7am (4 hours prior to your test) 2) You will be given 2 bottles of oral contrast to drink on  site.    Drink 1 bottle of contrast @ 9am (2 hours prior to your exam)  Drink 1 bottle of contrast @ 10am (1 hour prior to your exam)  You may take any medications as prescribed with a small amount of water, if necessary. If you take any of the following medications: METFORMIN, GLUCOPHAGE, GLUCOVANCE, AVANDAMET, RIOMET, FORTAMET, ACTOPLUS MET, JANUMET, GLUMETZA or METAGLIP, you MAY be asked to HOLD this medication 48 hours AFTER the exam.  The purpose of you drinking the oral contrast is to aid in the visualization of your intestinal tract. The contrast solution may cause some diarrhea. Depending on your individual set of symptoms, you may also receive an intravenous injection of x-ray contrast/dye. Plan on being at [facility] for 30 minutes or longer, depending on the type of exam you are having performed.  This test typically takes 30-45 minutes to complete.  If you have any questions regarding your exam or if you need to reschedule, you may call the CT department at [phone] between the hours of 8:00 am and 5:00 pm, Monday-Friday. Contrast is only on Thursday. Without contrast is everyday.

## 2024-01-03 NOTE — Progress Notes (Signed)
 Chief Complaint: FU  Referring Provider:  Jama Chow, MD      ASSESSMENT AND PLAN;   #1. Lower abdo pain  #2. Prev H/O Diarrhea, now with alt constipation. Neg SB Bx for celiac on EGD 02/2016. Neg colonic biopsies for microscopic colitis Jan 2020  #3. Recent CAD s/p DES on ASA/plavix  12/20/2023    Plan: - CT AP with/without contrast - Benfiber 1 TBS po every day with 8 ounces of water - FU in 24 weeks.  HPI:    Paula Hamilton is a 73 y.o. female  With DM2, CKD, CAD s/p DES 12/20/2023 on aspirin /Plavix  (EF 65-70%), peripheral vascular disease, history of multiple strokes, ongoing tobacco use, hyperlipidemia, hypertension   Discussed the use of AI scribe software for clinical note transcription with the patient, who gave verbal consent to proceed.  History of Present Illness Paula Hamilton is a 73 year old female with mild colitis who presents with gastrointestinal symptoms including bloating, abdominal pain, and altered bowel habits. She is accompanied by her fianc.  She has been experiencing bloating and lower abdominal pain for about a year, but now worse. There is a sensation of constant pressure in the rectum, with an urge to defecate but often an inability to pass stool. Bowel habits fluctuate between diarrhea and constipation, with occasional nocturnal incontinence. No blood in the stool is noted. A colonoscopy in January 2020 revealed a few diverticula and mild colitis, but was otherwise normal. She has been using stool softeners and fiber supplements, but these have not consistently alleviated her symptoms. Consumption of raisins recently led to a night of diarrhea.  She experienced a heart attack on December 18, 2023, and had two stents placed the following day. She was previously attributing her chest pain to her ongoing stomach issues. She is currently on Plavix  and aspirin .  She has a history of osteoporosis and underwent back surgery in November 2024. She  experiences constant back pain at multiple levels, including L3, L4, L2, L3, L5, and S1, due to a bone pressing against a surgical cage.  She has diabetes with fluctuating blood sugar levels, taking 40 units of Lantus  daily. Her hemoglobin A1c is 8, and her creatinine level is 1.4. She reports poor vision, with no blood flow to the back of her left eye and a small hole in the retina of her right eye.  She recently had a mammogram that showed a lymph node under her arm, and she has a family history of cancer on her father's side, with her grandmother having had cancer that spread under her right arm.  She smokes but has reduced her consumption by half since her heart attack. She acknowledges not drinking enough water but is trying to increase her intake.   Past GI procedures: Colon 07/17/2018: - Mild sigmoid diverticulosis. - Patchy erythema suggestive of resolving acute colitis ( biopsed) . - Otherwise normal colonoscopy to TI. - Neg colonic and TI Bx - No need to repeat unless problems  -colon 11/21/2014 ([facility])-6 mm polyp status post polypectomy, mild sigmoid diverticulosis. -EGD 02/2016-moderate gastritis, negative small bowel biopsies.  CLO negative. Past Medical History:  Diagnosis Date   3rd cranial nerve palsy, right    just start 03/13/2017   Ambulates with cane    straight   Anxiety    Arthritis    listhesis, instability   Asthma    Bronchitis 02/2023   just finished Z pack and is taking cough syrup with Codiene for  cough   Chronic kidney disease    Chronic pain    COPD (chronic obstructive pulmonary disease) (HCC)    Depression    Diabetes mellitus without complication (HCC)    type 2   Diabetic retinopathy (HCC)    Dysphagia    Fibromyalgia    GERD (gastroesophageal reflux disease)    Headache(784.0)    Heart attack (HCC) 12/18/2023   Heart murmur    mentioned yrs. ago, never mentioned again.    History of colon polyps    Hx of migraines    Hypercholesterolemia     Hypertension    Memory loss    mild since stroke   Mental disorder    bipolar disorder - patient denies this dx as of 05/04/23.   Neuropathy    legs and feet   Pneumonia    x 1 yrs ago   PONV (postoperative nausea and vomiting)    has had other surgeries with no problems   Seizures (HCC) 09/2018   w/stroke in 2020   Shortness of breath    Stroke (HCC) 09/15/2018   multiple   Walker as ambulation aid    rollator walker    Past Surgical History:  Procedure Laterality Date   ABDOMINAL HYSTERECTOMY     APPENDECTOMY     age 62   BACK SURGERY     lower back 4 time, tens surgery too   CERVICAL FUSION     COLONOSCOPY W/ BIOPSIES AND POLYPECTOMY  11/21/2014   Colonic polyp status post polypectomy. benign. Mild sigmoid diverticulosis.   CORONARY STENT INTERVENTION N/A 12/20/2023   Procedure: CORONARY STENT INTERVENTION;  Surgeon: Swaziland, Peter M, MD;  Location: [facility] INVASIVE CV LAB;  Service: Cardiovascular;  Laterality: N/A;   ESOPHAGOGASTRODUODENOSCOPY  02/26/2016   Moderate gastritis. Otherwise normal EGD    EYE SURGERY     laser surgery on left eye 2023; cataracts removed bilateral 2023   LEFT HEART CATH AND CORONARY ANGIOGRAPHY N/A 12/20/2023   Procedure: LEFT HEART CATH AND CORONARY ANGIOGRAPHY;  Surgeon: Swaziland, Peter M, MD;  Location: [facility] INVASIVE CV LAB;  Service: Cardiovascular;  Laterality: N/A;   MAXIMUM ACCESS (MAS)POSTERIOR LUMBAR INTERBODY FUSION (PLIF) 1 LEVEL N/A 04/12/2013   Procedure: Lumba four-five maximum access surgery Posterior Lumbar Interbody Fusion;  Surgeon: Alm GORMAN Molt, MD;  Location: [facility] NEURO ORS;  Service: Neurosurgery;  Laterality: N/A;  Lumba four-five maximum access surgery Posterior Lumbar Interbody Fusion   SPINAL CORD STIMULATOR INSERTION N/A 06/14/2014   Procedure: LUMBAR SPINAL CORD STIMULATOR INSERTION;  Surgeon: Deward JAYSON Fabian, MD;  Location: [facility] NEURO ORS;  Service: Neurosurgery;  Laterality: N/A;   SPINAL CORD STIMULATOR REMOVAL N/A 04/12/2017    Procedure: LUMBAR SPINAL CORD STIMULATOR REMOVAL;  Surgeon: Fabian Deward, MD;  Location: [facility] OR;  Service: Neurosurgery;  Laterality: N/A;   TUBAL LIGATION  1984    Family History  Problem Relation Age of Onset   Breast cancer Mother    Lung cancer Mother    COPD Mother    Melanoma Father    Stroke Sister        in 2   Diabetes Sister    Stroke Brother        in 2   Diabetes Brother    Diabetes Brother    Stomach cancer Paternal Aunt    Colon cancer Paternal Uncle    Kidney cancer Paternal Uncle    Esophageal cancer Neg Hx    Liver disease Neg Hx  Social History   Tobacco Use   Smoking status: Every Day    Current packs/day: 0.00    Average packs/day: 1 pack/day for 30.0 years (30.0 ttl pk-yrs)    Types: Cigarettes    Start date: 05/21/1981    Last attempt to quit: 05/22/2011    Years since quitting: 12.6   Smokeless tobacco: Never   Tobacco comments:    Started smoking again in back in 2012 per patient on 04/29/23.  Vaping Use   Vaping status: Never Used  Substance Use Topics   Alcohol  use: Yes    Comment: occasional beer   Drug use: No    Current Outpatient Medications  Medication Sig Dispense Refill   amLODipine  (NORVASC ) 5 MG tablet Take 1 tablet (5 mg total) by mouth in the morning.     aspirin  EC 81 MG tablet Take 1 tablet (81 mg total) by mouth daily. Swallow whole. 90 tablet 3   Bempedoic Acid-Ezetimibe  (NEXLIZET) 180-10 MG TABS Take 1 tablet by mouth daily. 90 tablet 3   clopidogrel  (PLAVIX ) 75 MG tablet Take 1 tablet (75 mg total) by mouth daily with breakfast. 90 tablet 3   diphenhydrAMINE  (BENADRYL ) 25 mg capsule Take 25 mg by mouth at bedtime.     diphenoxylate -atropine  (LOMOTIL ) 2.5-0.025 MG tablet Take 1 tablet by mouth 3 (three) times daily as needed for diarrhea or loose stools. 90 tablet 1   empagliflozin  (JARDIANCE ) 10 MG TABS tablet Take 1 tablet (10 mg total) by mouth daily before breakfast. 30 tablet 11   gabapentin  (NEURONTIN ) 300 MG  capsule Take 300 mg by mouth daily as needed (pain).     insulin  glargine (LANTUS  SOLOSTAR) 100 UNIT/ML Solostar Pen Inject 40 Units into the skin in the morning.     levETIRAcetam  (KEPPRA ) 500 MG tablet Take 1 tablet (500 mg total) by mouth 2 (two) times daily. 180 tablet 4   lisinopril  (ZESTRIL ) 20 MG tablet Take 20 mg by mouth in the morning.     metoprolol  succinate (TOPROL -XL) 25 MG 24 hr tablet Take 1 tablet (25 mg total) by mouth daily. 30 tablet 11   nicotine  (NICODERM CQ  - DOSED IN MG/24 HR) 7 mg/24hr patch Place 1 patch (7 mg total) onto the skin daily. 14 patch 0   nitroGLYCERIN  (NITROSTAT ) 0.4 MG SL tablet Place 1 tablet (0.4 mg total) under the tongue every 5 (five) minutes as needed. 25 tablet 2   traMADol (ULTRAM) 50 MG tablet Take 50 mg by mouth every 6 (six) hours as needed for moderate pain (pain score 4-6).     No current facility-administered medications for this visit.    Allergies  Allergen Reactions   Codeine Rash and Swelling   Penicillins Rash    PATIENT HAS HAD A PCN REACTION WITH IMMEDIATE RASH, FACIAL/TONGUE/THROAT SWELLING, SOB, OR LIGHTHEADEDNESS WITH HYPOTENSION:  #  #  #  YES  #  #  #   Has patient had a PCN reaction causing severe rash involving mucus membranes or skin necrosis: No Has patient had a PCN reaction that required hospitalization: No Has patient had a PCN reaction occurring within the last 10 years: No    Pregabalin  Other (See Comments)    Suicidal thoughts, extreme depression   Atorvastatin    Doxycycline Swelling   Metformin Other (See Comments)    Profound muscle weakness whren mixed w/actos or januvia   Erythromycin Base Nausea And Vomiting    STOMACH PAIN   Morphine  And Codeine Nausea And  Vomiting    Has taken in pill form w/o side effects    Review of Systems:  Has anxiety and depression.     Physical Exam:    BP 130/68   Pulse 62   Ht 5' 3 (1.6 m)   Wt 146 lb (66.2 kg)   BMI 25.86 kg/m  Filed Weights   01/03/24 1136   Weight: 146 lb (66.2 kg)   Constitutional:  Well-developed, in no acute distress. Psychiatric: Normal mood and affect. Behavior is normal. HEENT: Pupils normal.  Conjunctivae are normal. No scleral icterus. Cardiovascular: Normal rate, regular rhythm. No edema Pulmonary/chest: Effort normal and breath sounds normal. No wheezing, rales or rhonchi. Abdominal: Soft, nondistended. Nontender. Bowel sounds active throughout. There are no masses palpable. No hepatomegaly. Rectal:  defered Neurological: Alert and oriented to person place and time. Skin: Skin is warm and dry. No rashes noted.  Data Reviewed: I have personally reviewed following labs and imaging studies  CBC:    Latest Ref Rng & Units 12/28/2023    2:07 PM 12/21/2023    3:33 AM 12/20/2023    6:21 PM  CBC  WBC 3.4 - 10.8 x10E3/uL 11.6  11.0  12.1   Hemoglobin 11.1 - 15.9 g/dL 86.0  86.6  85.7   Hematocrit 34.0 - 46.6 % 45.2  41.3  42.4   Platelets 150 - 450 x10E3/uL 411  297  329     CMP:    Latest Ref Rng & Units 12/28/2023    2:07 PM 12/21/2023    3:33 AM 12/20/2023    6:21 PM  CMP  Glucose 70 - 99 mg/dL 851  833    BUN 8 - 27 mg/dL 30  15    Creatinine 9.42 - 1.00 mg/dL 8.59  8.59  9.04   Sodium 134 - 144 mmol/L 144  139    Potassium 3.5 - 5.2 mmol/L 5.2  4.7    Chloride 96 - 106 mmol/L 102  100    CO2 20 - 29 mmol/L 24  30    Calcium 8.7 - 10.3 mg/dL 9.6  8.6    Total Protein 6.0 - 8.5 g/dL 6.4     Total Bilirubin 0.0 - 1.2 mg/dL <9.7     Alkaline Phos 44 - 121 IU/L 109     AST 0 - 40 IU/L 13     ALT 0 - 32 IU/L 7     25 minutes spent with the patient today. Greater than 50% was spent in counseling and coordination of care with the patient    Anselm Bring, MD 01/03/2024, 12:02 PM  Cc: Jama Chow, MD

## 2024-01-04 NOTE — Addendum Note (Signed)
 Addended by: KATHIE BOTTCHER E on: 01/04/2024 10:11 AM   Modules accepted: Orders

## 2024-01-10 ENCOUNTER — Ambulatory Visit: Admitting: Physician Assistant

## 2024-01-12 ENCOUNTER — Ambulatory Visit (HOSPITAL_BASED_OUTPATIENT_CLINIC_OR_DEPARTMENT_OTHER)
Admission: RE | Admit: 2024-01-12 | Discharge: 2024-01-12 | Disposition: A | Discharge status: Home / Self Care | Source: Ambulatory Visit | Attending: Gastroenterology | Admitting: Gastroenterology

## 2024-01-12 DIAGNOSIS — R1030 Lower abdominal pain, unspecified: Secondary | ICD-10-CM | POA: Diagnosis not present

## 2024-01-12 MED ORDER — IOHEXOL 300 MG/ML  SOLN
100.0000 mL | Freq: Once | INTRAMUSCULAR | Status: AC | PRN
  Administered 2024-01-12: 100 mL via INTRAVENOUS

## 2024-01-12 MED ORDER — IOHEXOL 300 MG/ML  SOLN: 100.0000 mL | Freq: Once | INTRAMUSCULAR | Status: DC | PRN

## 2024-01-14 ENCOUNTER — Ambulatory Visit: Payer: Self-pay | Admitting: Gastroenterology

## 2024-01-18 ENCOUNTER — Ambulatory Visit (HOSPITAL_BASED_OUTPATIENT_CLINIC_OR_DEPARTMENT_OTHER): Admitting: Family Medicine

## 2024-01-18 ENCOUNTER — Encounter (HOSPITAL_BASED_OUTPATIENT_CLINIC_OR_DEPARTMENT_OTHER): Payer: Self-pay | Admitting: Family Medicine

## 2024-01-18 VITALS — BP 125/77 | HR 59 | Temp 98.3°F | Resp 20 | Ht 64.17 in | Wt 146.0 lb

## 2024-01-18 DIAGNOSIS — E118 Type 2 diabetes mellitus with unspecified complications: Secondary | ICD-10-CM | POA: Diagnosis not present

## 2024-01-18 DIAGNOSIS — M810 Age-related osteoporosis without current pathological fracture: Secondary | ICD-10-CM | POA: Diagnosis not present

## 2024-01-18 DIAGNOSIS — Z794 Long term (current) use of insulin: Secondary | ICD-10-CM

## 2024-01-18 DIAGNOSIS — I10 Essential (primary) hypertension: Secondary | ICD-10-CM | POA: Diagnosis not present

## 2024-01-18 DIAGNOSIS — N1832 Chronic kidney disease, stage 3b: Secondary | ICD-10-CM | POA: Insufficient documentation

## 2024-01-18 DIAGNOSIS — R928 Other abnormal and inconclusive findings on diagnostic imaging of breast: Secondary | ICD-10-CM | POA: Diagnosis not present

## 2024-01-18 DIAGNOSIS — Z8673 Personal history of transient ischemic attack (TIA), and cerebral infarction without residual deficits: Secondary | ICD-10-CM | POA: Diagnosis not present

## 2024-01-18 DIAGNOSIS — N183 Chronic kidney disease, stage 3 unspecified: Secondary | ICD-10-CM | POA: Insufficient documentation

## 2024-01-18 MED ORDER — BLOOD GLUCOSE MONITORING SUPPL DEVI
1.0000 | 3 refills | Status: AC | PRN
Start: 2024-01-18 — End: 2024-04-17

## 2024-01-18 MED ORDER — LANCETS MISC. MISC: 1.0000 | Freq: Three times a day (TID) | 0 refills | Status: AC

## 2024-01-18 MED ORDER — BLOOD GLUCOSE TEST VI STRP: 1.0000 | ORAL_STRIP | Freq: Three times a day (TID) | 0 refills | Status: AC

## 2024-01-18 MED ORDER — LANCET DEVICE MISC: 1.0000 | Freq: Three times a day (TID) | 0 refills | Status: AC

## 2024-01-18 NOTE — Progress Notes (Signed)
 Established Patient Office Visit  Subjective   Patient ID: Paula Hamilton, female    DOB: 09-28-1950  Age: 73 y.o. MRN: [id number]  Chief Complaint  Patient presents with   Establish Care    Establish care     F/u as above.  New to my practice.  We reviewed her many issues in detail today.  Fortunately she is quite stable with few acute concerns.  Needs f/u on recently noted abnormal Mammogram and its unclear what that status is.    Past Medical History:  Diagnosis Date   Anxiety    Asthma    CAD (coronary artery disease)    with previous MI, f/by [facility]   Chronic pain    due to OA and Neuropathy f/by Dr. Joshua of Neurosurgery   Controlled type 2 diabetes mellitus with neuropathy (HCC)    COPD (chronic obstructive pulmonary disease) (HCC)    Depression    Diabetic retinopathy (HCC)    GERD (gastroesophageal reflux disease)    Hx of migraines    Hypercholesterolemia    Hypertension    Lung nodule    2025, with serial CT scans advised   Osteoporosis    Pneumonia    x 1 yrs ago   Seizures (HCC) 09/2018   w/stroke in 2020   Stroke [facility]) 09/15/2018   multiple, f/by [facility]   Tobacco abuse     Outpatient Encounter Medications as of 01/18/2024  Medication Sig   amLODipine  (NORVASC ) 5 MG tablet Take 1 tablet (5 mg total) by mouth in the morning.   aspirin  EC 81 MG tablet Take 1 tablet (81 mg total) by mouth daily. Swallow whole.   Bempedoic Acid-Ezetimibe  (NEXLIZET ) 180-10 MG TABS Take 1 tablet by mouth daily.   Blood Glucose Monitoring Suppl DEVI 1 each by Does not apply route as needed. May substitute to any manufacturer covered by patient's insurance.   clopidogrel  (PLAVIX ) 75 MG tablet Take 1 tablet (75 mg total) by mouth daily with breakfast.   diphenhydrAMINE  (BENADRYL ) 25 mg capsule Take 25 mg by mouth at bedtime.   diphenoxylate -atropine  (LOMOTIL ) 2.5-0.025 MG tablet Take 1 tablet by mouth 3 (three) times daily as needed for diarrhea or loose  stools.   empagliflozin  (JARDIANCE ) 10 MG TABS tablet Take 1 tablet (10 mg total) by mouth daily before breakfast.   Glucose Blood (BLOOD GLUCOSE TEST STRIPS) STRP 1 each by In Vitro route in the morning, at noon, and at bedtime. May substitute to any manufacturer covered by patient's insurance.   insulin  glargine (LANTUS  SOLOSTAR) 100 UNIT/ML Solostar Pen Inject 40 Units into the skin in the morning.   Lancet Device MISC 1 each by Does not apply route in the morning, at noon, and at bedtime. May substitute to any manufacturer covered by patient's insurance.   Lancets Misc. MISC 1 each by Does not apply route in the morning, at noon, and at bedtime. May substitute to any manufacturer covered by patient's insurance.   levETIRAcetam  (KEPPRA ) 500 MG tablet Take 1 tablet (500 mg total) by mouth 2 (two) times daily.   lisinopril  (ZESTRIL ) 20 MG tablet Take 20 mg by mouth in the morning.   metoprolol  succinate (TOPROL -XL) 25 MG 24 hr tablet Take 1 tablet (25 mg total) by mouth daily.   nitroGLYCERIN  (NITROSTAT ) 0.4 MG SL tablet Place 1 tablet (0.4 mg total) under the tongue every 5 (five) minutes as needed.   traMADol (ULTRAM) 50 MG tablet Take 50 mg by mouth  every 6 (six) hours as needed for moderate pain (pain score 4-6).   gabapentin  (NEURONTIN ) 300 MG capsule Take 300 mg by mouth daily as needed (pain). (Patient not taking: Reported on 01/18/2024)   nicotine  (NICODERM CQ  - DOSED IN MG/24 HR) 7 mg/24hr patch Place 1 patch (7 mg total) onto the skin daily. (Patient not taking: Reported on 01/18/2024)   No facility-administered encounter medications on file as of 01/18/2024.    Social History   Tobacco Use   Smoking status: Every Day    Current packs/day: 0.00    Average packs/day: 1 pack/day for 30.0 years (30.0 ttl pk-yrs)    Types: Cigarettes    Start date: 05/21/1981    Last attempt to quit: 05/22/2011    Years since quitting: 12.6   Smokeless tobacco: Never   Tobacco comments:    Started  smoking again in back in 2012 per patient on 04/29/23.  Vaping Use   Vaping status: Never Used  Substance Use Topics   Alcohol  use: Yes    Comment: occasional beer   Drug use: No      Review of Systems  Constitutional:  Negative for diaphoresis, fever, malaise/fatigue and weight loss.  Respiratory:  Negative for cough, shortness of breath and wheezing.   Cardiovascular:  Negative for chest pain, palpitations, orthopnea, claudication, leg swelling and PND.      Objective:     BP 125/77 (BP Location: Right Arm, Patient Position: Standing, Cuff Size: Normal)   Pulse (!) 59   Temp 98.3 F (36.8 C) (Oral)   Resp 20   Ht 5' 4.17 (1.63 m)   Wt 146 lb (66.2 kg)   SpO2 95%   BMI 24.93 kg/m    Physical Exam Constitutional:      General: She is not in acute distress.    Appearance: Normal appearance.     Comments: Chronically ill appearing  HENT:     Head: Normocephalic.  Cardiovascular:     Rate and Rhythm: Normal rate and regular rhythm.     Pulses: Normal pulses.     Heart sounds: Normal heart sounds.  Pulmonary:     Effort: Pulmonary effort is normal.     Breath sounds: Normal breath sounds.  Abdominal:     General: Bowel sounds are normal.     Palpations: Abdomen is soft.  Musculoskeletal:     Cervical back: Neck supple. No tenderness.     Right lower leg: No edema.     Left lower leg: No edema.  Neurological:     Mental Status: She is alert.      No results found for any visits on 01/18/24.    The ASCVD Risk score (Arnett DK, et al., 2019) failed to calculate for the following reasons:   Risk score cannot be calculated because patient has a medical history suggesting prior/existing ASCVD    Assessment & Plan:  History of stroke Assessment & Plan: Continue reviewing her old records.  Get more records pertaining to her breast lump.  Arrange for close follow up until we are caught up.  Recent labs noted.   Primary hypertension  Abnormal  mammogram  Osteoporosis, unspecified osteoporosis type, unspecified pathological fracture presence  Type 2 diabetes mellitus with complication, with long-term current use of insulin  (HCC)  CKD stage 3b, GFR 30-44 ml/min (HCC)  Other orders -     Blood Glucose Monitoring Suppl; 1 each by Does not apply route as needed. May substitute to any manufacturer covered  by patient's insurance.  Dispense: 1 each; Refill: 3 -     Blood Glucose Test; 1 each by In Vitro route in the morning, at noon, and at bedtime. May substitute to any manufacturer covered by patient's insurance.  Dispense: 100 strip; Refill: 0 -     Lancet Device; 1 each by Does not apply route in the morning, at noon, and at bedtime. May substitute to any manufacturer covered by patient's insurance.  Dispense: 1 each; Refill: 0 -     Lancets Misc.; 1 each by Does not apply route in the morning, at noon, and at bedtime. May substitute to any manufacturer covered by patient's insurance.  Dispense: 100 each; Refill: 0    Return in about 4 weeks (around 02/15/2024) for chronic follow-up.    REDDING PONCE NORLEEN FALCON., MD

## 2024-01-18 NOTE — Assessment & Plan Note (Signed)
 Continue reviewing her old records.  Get more records pertaining to her breast lump.  Arrange for close follow up until we are caught up.  Recent labs noted.

## 2024-01-19 DIAGNOSIS — M5416 Radiculopathy, lumbar region: Secondary | ICD-10-CM | POA: Diagnosis not present

## 2024-01-19 DIAGNOSIS — S32009K Unspecified fracture of unspecified lumbar vertebra, subsequent encounter for fracture with nonunion: Secondary | ICD-10-CM | POA: Diagnosis not present

## 2024-01-25 ENCOUNTER — Ambulatory Visit: Attending: Cardiology | Admitting: Cardiology

## 2024-01-25 VITALS — BP 108/64 | HR 63 | Ht 64.0 in | Wt 142.6 lb

## 2024-01-25 DIAGNOSIS — E782 Mixed hyperlipidemia: Secondary | ICD-10-CM | POA: Diagnosis not present

## 2024-01-25 DIAGNOSIS — E1151 Type 2 diabetes mellitus with diabetic peripheral angiopathy without gangrene: Secondary | ICD-10-CM

## 2024-01-25 DIAGNOSIS — Z794 Long term (current) use of insulin: Secondary | ICD-10-CM

## 2024-01-25 DIAGNOSIS — E118 Type 2 diabetes mellitus with unspecified complications: Secondary | ICD-10-CM

## 2024-01-25 DIAGNOSIS — I10 Essential (primary) hypertension: Secondary | ICD-10-CM | POA: Diagnosis not present

## 2024-01-25 DIAGNOSIS — I2510 Atherosclerotic heart disease of native coronary artery without angina pectoris: Secondary | ICD-10-CM | POA: Insufficient documentation

## 2024-01-25 DIAGNOSIS — Z720 Tobacco use: Secondary | ICD-10-CM

## 2024-01-25 DIAGNOSIS — I25118 Atherosclerotic heart disease of native coronary artery with other forms of angina pectoris: Secondary | ICD-10-CM | POA: Diagnosis not present

## 2024-01-25 MED ORDER — METOPROLOL SUCCINATE ER 50 MG PO TB24: 50.0000 mg | ORAL_TABLET | Freq: Every day | ORAL | 3 refills | Status: DC

## 2024-01-25 NOTE — Progress Notes (Unsigned)
 Cardiology Office Note:    Date:  01/25/2024   ID:  Paula Hamilton, DOB [birth year], MRN [id number]  PCP:  [name], MD  Cardiologist:  Lamar Fitch, MD    Referring MD: Jama Chow, MD   Chief Complaint  Patient presents with   chest discomfort   severe diarrhea    History of Present Illness:    Paula Hamilton is a 73 y.o. female past medical history significant for coronary artery disease, month ago she ended up coming to the hospital with chest pain she was found to have severe two-vessel coronary artery disease PTCA of mid LAD with drug-eluting stent as well as PTCA of obtuse marginal drug-eluting stent has been performed.  He does have multiple residual noncritical stenosis,.  She is a chronic smoker still continues to smoke however cut down from 3 packs/day to only 1 pack/day.  Also essential hypertension.  Comes today to months for follow-up would [place] the most is diarrhea she said she got diarrhea for long period of time but lately became worse she is seeing gastroenterologist trying to figure out what the problem is.  She described few episode of chest pain but does happening only at rest.  She graded at 3-4 and scale up to 10.  Different compared to pain that she had before.  Past Medical History:  Diagnosis Date   Anxiety    Asthma    CAD (coronary artery disease)    with previous MI, f/by [facility]   Chronic pain    due to OA and Neuropathy f/by Dr. Joshua of Neurosurgery   Controlled type 2 diabetes mellitus with neuropathy (HCC)    COPD (chronic obstructive pulmonary disease) (HCC)    Depression    Diabetic retinopathy (HCC)    GERD (gastroesophageal reflux disease)    Hx of migraines    Hypercholesterolemia    Hypertension    Lung nodule    2025, with serial CT scans advised   Osteoporosis    Pneumonia    x 1 yrs ago   Seizures (HCC) 09/2018   w/stroke in 2020   Stroke [facility]) 09/15/2018   multiple, f/by [facility]   Tobacco  abuse     Past Surgical History:  Procedure Laterality Date   ABDOMINAL HYSTERECTOMY     APPENDECTOMY     age 56   BACK SURGERY     lower back 4 time, tens surgery too   CERVICAL FUSION     COLONOSCOPY W/ BIOPSIES AND POLYPECTOMY N/A 11/21/2014   f/by Dr. Charlanne   CORONARY STENT INTERVENTION N/A 12/20/2023   Procedure: CORONARY STENT INTERVENTION;  Surgeon: Swaziland, Peter M, MD;  Location: [facility] INVASIVE CV LAB;  Service: Cardiovascular;  Laterality: N/A;   ESOPHAGOGASTRODUODENOSCOPY  02/26/2016   Moderate gastritis. Otherwise normal EGD    EYE SURGERY     laser surgery on left eye 2023; cataracts removed bilateral 2023   LEFT HEART CATH AND CORONARY ANGIOGRAPHY N/A 12/20/2023   Procedure: LEFT HEART CATH AND CORONARY ANGIOGRAPHY;  Surgeon: Swaziland, Peter M, MD;  Location: [facility] INVASIVE CV LAB;  Service: Cardiovascular;  Laterality: N/A;   MAXIMUM ACCESS (MAS)POSTERIOR LUMBAR INTERBODY FUSION (PLIF) 1 LEVEL N/A 04/12/2013   Procedure: Lumba four-five maximum access surgery Posterior Lumbar Interbody Fusion;  Surgeon: Alm GORMAN Joshua, MD;  Location: [facility] NEURO ORS;  Service: Neurosurgery;  Laterality: N/A;  Lumba four-five maximum access surgery Posterior Lumbar Interbody Fusion   SPINAL CORD STIMULATOR INSERTION N/A 06/14/2014  Procedure: LUMBAR SPINAL CORD STIMULATOR INSERTION;  Surgeon: Deward JAYSON Fabian, MD;  Location: [facility] NEURO ORS;  Service: Neurosurgery;  Laterality: N/A;   SPINAL CORD STIMULATOR REMOVAL N/A 04/12/2017   Procedure: LUMBAR SPINAL CORD STIMULATOR REMOVAL;  Surgeon: Fabian Deward, MD;  Location[facility];  Service: Neurosurgery;  Laterality: N/A;   TUBAL LIGATION  1984    Current Medications: Current Meds  Medication Sig   amLODipine  (NORVASC ) 5 MG tablet Take 1 tablet (5 mg total) by mouth in the morning.   aspirin  EC 81 MG tablet Take 1 tablet (81 mg total) by mouth daily. Swallow whole.   Bempedoic Acid-Ezetimibe  (NEXLIZET ) 180-10 MG TABS Take 1 tablet by mouth daily.   Blood  Glucose Monitoring Suppl DEVI 1 each by Does not apply route as needed. May substitute to any manufacturer covered by patient's insurance.   clopidogrel  (PLAVIX ) 75 MG tablet Take 1 tablet (75 mg total) by mouth daily with breakfast.   diphenhydrAMINE  (BENADRYL ) 25 mg capsule Take 25 mg by mouth at bedtime.   diphenoxylate -atropine  (LOMOTIL ) 2.5-0.025 MG tablet Take 1 tablet by mouth 3 (three) times daily as needed for diarrhea or loose stools.   empagliflozin  (JARDIANCE ) 10 MG TABS tablet Take 1 tablet (10 mg total) by mouth daily before breakfast.   gabapentin  (NEURONTIN ) 300 MG capsule Take 300 mg by mouth daily as needed (pain).   Glucose Blood (BLOOD GLUCOSE TEST STRIPS) STRP 1 each by In Vitro route in the morning, at noon, and at bedtime. May substitute to any manufacturer covered by patient's insurance.   insulin  glargine (LANTUS  SOLOSTAR) 100 UNIT/ML Solostar Pen Inject 40 Units into the skin in the morning.   Lancet Device MISC 1 each by Does not apply route in the morning, at noon, and at bedtime. May substitute to any manufacturer covered by patient's insurance.   Lancets Misc. MISC 1 each by Does not apply route in the morning, at noon, and at bedtime. May substitute to any manufacturer covered by patient's insurance.   levETIRAcetam  (KEPPRA ) 500 MG tablet Take 1 tablet (500 mg total) by mouth 2 (two) times daily.   lisinopril  (ZESTRIL ) 20 MG tablet Take 20 mg by mouth in the morning.   metoprolol  succinate (TOPROL -XL) 25 MG 24 hr tablet Take 1 tablet (25 mg total) by mouth daily.   nicotine  (NICODERM CQ  - DOSED IN MG/24 HR) 7 mg/24hr patch Place 1 patch (7 mg total) onto the skin daily.   nitroGLYCERIN  (NITROSTAT ) 0.4 MG SL tablet Place 1 tablet (0.4 mg total) under the tongue every 5 (five) minutes as needed.   traMADol (ULTRAM) 50 MG tablet Take 50 mg by mouth every 6 (six) hours as needed for moderate pain (pain score 4-6).     Allergies:   Codeine, Penicillins, Pregabalin ,  Atorvastatin, Doxycycline, Metformin, Erythromycin base, and Morphine  and codeine   Social History   Socioeconomic History   Marital status: Widowed    Spouse name: Not on file   Number of children: 2   Years of education: Not on file   Highest education level: Some college, no degree  Occupational History    Comment: retired   Occupation: retired  Tobacco Use   Smoking status: Every Day    Current packs/day: 0.00    Average packs/day: 1 pack/day for 30.0 years (30.0 ttl pk-yrs)    Types: Cigarettes    Start date: 05/21/1981    Last attempt to quit: 05/22/2011    Years since quitting: 12.6  Smokeless tobacco: Never   Tobacco comments:    Started smoking again in back in 2012 per patient on 04/29/23.  Vaping Use   Vaping status: Never Used  Substance and Sexual Activity   Alcohol  use: Yes    Comment: occasional beer   Drug use: No   Sexual activity: Yes    Birth control/protection: Surgical    Comment: Hysterectomy  Other Topics Concern   Not on file  Social History Narrative   Lives with sister, bro-in-law   Caffeine- not much   Social Drivers of Corporate investment banker Strain: Not on file  Food Insecurity: No Food Insecurity (12/20/2023)   Hunger Vital Sign    Worried About Running Out of Food in the Last Year: Never true    Ran Out of Food in the Last Year: Never true  Transportation Needs: No Transportation Needs (12/20/2023)   PRAPARE - Administrator, Civil Service (Medical): No    Lack of Transportation (Non-Medical): No  Physical Activity: Not on file  Stress: Not on file  Social Connections: Socially Integrated (12/20/2023)   Social Connection and Isolation Panel    Frequency of Communication with Friends and Family: Twice a week    Frequency of Social Gatherings with Friends and Family: Once a week    Attends Religious Services: 1 to 4 times per year    Active Member of [employer] or Organizations: No    Attends Engineer, structural:  More than 4 times per year    Marital Status: Married     Family History: The patient's family history includes Breast cancer in her mother; COPD in her mother; Colon cancer in her paternal uncle; Diabetes in her brother, brother, and sister; Kidney cancer in her paternal uncle; Lung cancer in her mother; Melanoma in her father; Stomach cancer in her paternal aunt; Stroke in her brother and sister. There is no history of Esophageal cancer or Liver disease. ROS:   Please see the history of present illness.    All 14 point review of systems negative except as described per history of present illness  EKGs/Labs/Other Studies Reviewed:        Recent Labs: 12/28/2023: ALT 7; BUN 30; Creatinine, Ser 1.40; Hemoglobin 13.9; Platelets 411; Potassium 5.2; Sodium 144  Recent Lipid Panel    Component Value Date/Time   CHOL 145 12/21/2023 0740   TRIG 160 (H) 12/21/2023 0740   HDL 26 (L) 12/21/2023 0740   CHOLHDL 5.6 12/21/2023 0740   VLDL 32 12/21/2023 0740   LDLCALC 87 12/21/2023 0740   LDLCALC 69 09/07/2019 1213    Physical Exam:    VS:  BP 108/64 (BP Location: Left Arm, Patient Position: Sitting)   Pulse 63   Ht 5' 4 (1.626 m)   Wt 142 lb 9.6 oz (64.7 kg)   SpO2 93%   BMI 24.48 kg/m     Wt Readings from Last 3 Encounters:  01/25/24 142 lb 9.6 oz (64.7 kg)  01/18/24 146 lb (66.2 kg)  01/03/24 146 lb (66.2 kg)     GEN:  Well nourished, well developed in no acute distress HEENT: Normal NECK: No JVD; No carotid bruits LYMPHATICS: No lymphadenopathy CARDIAC: RRR, no murmurs, no rubs, no gallops RESPIRATORY:  Clear to auscultation without rales, wheezing or rhonchi  ABDOMEN: Soft, non-tender, non-distended MUSCULOSKELETAL:  No edema; No deformity  SKIN: Warm and dry LOWER EXTREMITIES: no swelling NEUROLOGIC:  Alert and oriented x 3 PSYCHIATRIC:  Normal affect  ASSESSMENT:    1. Essential hypertension   2. Coronary artery disease of native artery of native heart with  stable angina pectoris (HCC)   3. Diabetes mellitus type 2 with peripheral artery disease (HCC)   4. Type 2 diabetes mellitus with complication, with long-term current use of insulin  (HCC)   5. Tobacco use    PLAN:    In order of problems listed above:  Coronary disease: Continue dual antiplatelet therapy, will increase dose of beta-blocker to help with her symptomatology also asked her to start taking nitroglycerin  on an as-needed basis. Essential hypertension: Blood pressure seems to well-controlled continue present management. Tobacco abuse we spent at least 5 minutes talking about this I strongly recommend to quit.  She cut down from 3 packs to 1 pack/day which is a good move but still ultimate goal will be completely discontinue. Dyslipidemia she had difficulty tolerating statin she is on Nexlizet  right now in the future I am afraid we have to go to PCSK9 agent.  I will ask her to have fasting lipid profile done   Medication Adjustments/Labs and Tests Ordered: Current medicines are reviewed at length with the patient today.  Concerns regarding medicines are outlined above.  Orders Placed This Encounter  Procedures   EKG 12-Lead   Medication changes: No orders of the defined types were placed in this encounter.   Signed, Lamar DOROTHA Fitch, MD, [facility] 01/25/2024 5:08 PM    [facility]

## 2024-01-25 NOTE — Patient Instructions (Signed)
 Medication Instructions:   INCREASE: Metoprolol  to 50mg  daily   Lab Work: Your physician recommends that you return for lab work in: 1  month  You need to have labs done when you are fasting.  You can come Monday through Friday 8:30 am to 12:00 pm and 1:15 to 4:30. You do not need to make an appointment as the order has already been placed. The labs you are going to have done are AST, ALT and Lipids.    Testing/Procedures: None Ordered   Follow-Up: At [facility], you and your health needs are our priority.  As part of our continuing mission to provide you with exceptional heart care, we have created designated Provider Care Teams.  These Care Teams include your primary Cardiologist (physician) and Advanced Practice Providers (APPs -  Physician Assistants and Nurse Practitioners) who all work together to provide you with the care you need, when you need it.  We recommend signing up for the patient portal called MyChart.  Sign up information is provided on this After Visit Summary.  MyChart is used to connect with patients for Virtual Visits (Telemedicine).  Patients are able to view lab/test results, encounter notes, upcoming appointments, etc.  Non-urgent messages can be sent to your provider as well.   To learn more about what you can do with MyChart, go to [email or web address].    Your next appointment:   2 month(s)  The format for your next appointment:   In Person  Provider:   Lamar Fitch, MD    Other Instructions NA

## 2024-02-01 DIAGNOSIS — S32009K Unspecified fracture of unspecified lumbar vertebra, subsequent encounter for fracture with nonunion: Secondary | ICD-10-CM | POA: Diagnosis not present

## 2024-02-01 DIAGNOSIS — M48061 Spinal stenosis, lumbar region without neurogenic claudication: Secondary | ICD-10-CM | POA: Diagnosis not present

## 2024-02-01 DIAGNOSIS — M4807 Spinal stenosis, lumbosacral region: Secondary | ICD-10-CM | POA: Diagnosis not present

## 2024-02-01 DIAGNOSIS — M47816 Spondylosis without myelopathy or radiculopathy, lumbar region: Secondary | ICD-10-CM | POA: Diagnosis not present

## 2024-02-01 DIAGNOSIS — M5136 Other intervertebral disc degeneration, lumbar region with discogenic back pain only: Secondary | ICD-10-CM | POA: Diagnosis not present

## 2024-02-02 ENCOUNTER — Other Ambulatory Visit: Payer: Self-pay

## 2024-02-02 ENCOUNTER — Telehealth: Payer: Self-pay | Admitting: Cardiology

## 2024-02-02 NOTE — Telephone Encounter (Signed)
  Patient is calling because when she went to Cardiac rehab her blood pressure was too low 82/46 and they wouldn't start her rehab. She says they were supposed to send a message over to Dr Bernie regarding possibly changing her medications. Please advise patient

## 2024-02-02 NOTE — Telephone Encounter (Signed)
 Called the patient and she reported that she went to cardiac rehab today and she felt light headed, dizzy and weak. They checked her blood pressure and it was 82/46. They did not allow her to start her cardiac rehab and advised her to follow up with Dr. Krasowski. Please advise.

## 2024-02-03 ENCOUNTER — Other Ambulatory Visit: Payer: Self-pay

## 2024-02-03 NOTE — Telephone Encounter (Signed)
 Pt calling back to f/u on call from yesterday. Please advise.

## 2024-02-03 NOTE — Telephone Encounter (Signed)
 Patient came into the office and she was informed of Dr. Karry recommendation below regarding her low blood pressures at cardiac rehab:  Please stop amlodipine   Patient verbalized understanding and had no further questions at this time.

## 2024-02-06 ENCOUNTER — Telehealth: Payer: Self-pay | Admitting: Gastroenterology

## 2024-02-06 NOTE — Telephone Encounter (Signed)
 PT is calling to speak with a nurse. She Is having reoccurring symptoms of abdominal pain. Please advise.

## 2024-02-07 NOTE — Telephone Encounter (Signed)
 Left message for pt to call back

## 2024-02-08 NOTE — Telephone Encounter (Signed)
 Pt stated that she is continuing to have generalized abdominal pain along with diarrhea. Pt had recent office visit on 01/03/2024 with Dr. Charlanne and a CT scan on 01/14/2024. Pt requesting a follow up visit to see Dr. Charlanne. Pt to be scheduled to see Dr. Charlanne on 02/24/2024 at 11:00 ( 7 day hold) pt made aware. Pt verbalized understanding with all questions answered.

## 2024-02-08 NOTE — Telephone Encounter (Signed)
 Unable to reach pt. Unable to leave voice message due to voice mailbox being full.

## 2024-02-16 ENCOUNTER — Encounter (HOSPITAL_BASED_OUTPATIENT_CLINIC_OR_DEPARTMENT_OTHER): Payer: Self-pay | Admitting: Family Medicine

## 2024-02-16 ENCOUNTER — Encounter (HOSPITAL_BASED_OUTPATIENT_CLINIC_OR_DEPARTMENT_OTHER): Payer: Self-pay

## 2024-02-16 ENCOUNTER — Ambulatory Visit (INDEPENDENT_AMBULATORY_CARE_PROVIDER_SITE_OTHER): Admitting: Family Medicine

## 2024-02-16 VITALS — BP 131/77 | HR 55 | Temp 98.7°F | Resp 16 | Wt 144.3 lb

## 2024-02-16 DIAGNOSIS — M545 Low back pain, unspecified: Secondary | ICD-10-CM | POA: Diagnosis not present

## 2024-02-16 DIAGNOSIS — G8929 Other chronic pain: Secondary | ICD-10-CM | POA: Diagnosis not present

## 2024-02-16 DIAGNOSIS — E1165 Type 2 diabetes mellitus with hyperglycemia: Secondary | ICD-10-CM | POA: Diagnosis not present

## 2024-02-16 DIAGNOSIS — F419 Anxiety disorder, unspecified: Secondary | ICD-10-CM | POA: Insufficient documentation

## 2024-02-16 DIAGNOSIS — R5382 Chronic fatigue, unspecified: Secondary | ICD-10-CM

## 2024-02-16 DIAGNOSIS — R5383 Other fatigue: Secondary | ICD-10-CM | POA: Insufficient documentation

## 2024-02-16 MED ORDER — DULOXETINE HCL 30 MG PO CPEP: 30.0000 mg | ORAL_CAPSULE | Freq: Every day | ORAL | 2 refills | Status: DC

## 2024-02-16 NOTE — Assessment & Plan Note (Signed)
 Recheck A1c and f/u with her soon.

## 2024-02-16 NOTE — Progress Notes (Signed)
 Established Patient Office Visit  Subjective   Patient ID: Paula Hamilton, female    DOB: 10/24/1950  Age: 73 y.o. MRN: [id number]  Chief Complaint  Patient presents with   Follow-up    Follow-up     F/u as above.  Please see recent notes for details.  Her back pain persists, and she states her Neurosurgeon has done about all he can.  She has seen pain control in years past but not lately.  She remains frustrated with her fatigue.  Recent labs noted, but were not all inclusive.  Admits her stress level is pretty high and clearly needs more help with chronic pain.    Past Medical History:  Diagnosis Date   Anxiety    Asthma    CAD (coronary artery disease)    with previous MI, f/by [facility]   Chronic pain    due to OA and Neuropathy f/by Dr. Joshua of Neurosurgery   Controlled type 2 diabetes mellitus with neuropathy (HCC)    COPD (chronic obstructive pulmonary disease) (HCC)    Depression    Diabetic retinopathy (HCC)    GERD (gastroesophageal reflux disease)    Hx of migraines    Hypercholesterolemia    Hypertension    Lung nodule    2025, with serial CT scans advised   Osteoporosis    Pneumonia    x 1 yrs ago   Seizures (HCC) 09/2018   w/stroke in 2020   Stroke (HCC) 09/15/2018   multiple, f/by [facility]   Tobacco abuse     Outpatient Encounter Medications as of 02/16/2024  Medication Sig   aspirin  EC 81 MG tablet Take 1 tablet (81 mg total) by mouth daily. Swallow whole.   Bempedoic Acid-Ezetimibe  (NEXLIZET ) 180-10 MG TABS Take 1 tablet by mouth daily.   Blood Glucose Monitoring Suppl DEVI 1 each by Does not apply route as needed. May substitute to any manufacturer covered by patient's insurance.   clopidogrel  (PLAVIX ) 75 MG tablet Take 1 tablet (75 mg total) by mouth daily with breakfast.   diphenoxylate -atropine  (LOMOTIL ) 2.5-0.025 MG tablet Take 1 tablet by mouth 3 (three) times daily as needed for diarrhea or loose stools.   DULoxetine  (CYMBALTA )  30 MG capsule Take 1 capsule (30 mg total) by mouth daily.   empagliflozin  (JARDIANCE ) 10 MG TABS tablet Take 1 tablet (10 mg total) by mouth daily before breakfast.   gabapentin  (NEURONTIN ) 300 MG capsule Take 300 mg by mouth daily as needed (pain).   Glucose Blood (BLOOD GLUCOSE TEST STRIPS) STRP 1 each by In Vitro route in the morning, at noon, and at bedtime. May substitute to any manufacturer covered by patient's insurance.   hydrochlorothiazide  (MICROZIDE ) 12.5 MG capsule Take 12.5 mg by mouth daily.   insulin  glargine (LANTUS  SOLOSTAR) 100 UNIT/ML Solostar Pen Inject 40 Units into the skin in the morning.   Lancet Device MISC 1 each by Does not apply route in the morning, at noon, and at bedtime. May substitute to any manufacturer covered by patient's insurance.   Lancets Misc. MISC 1 each by Does not apply route in the morning, at noon, and at bedtime. May substitute to any manufacturer covered by patient's insurance.   levETIRAcetam  (KEPPRA ) 500 MG tablet Take 1 tablet (500 mg total) by mouth 2 (two) times daily.   lisinopril  (ZESTRIL ) 20 MG tablet Take 20 mg by mouth in the morning.   metoprolol  succinate (TOPROL -XL) 25 MG 24 hr tablet Take 1 tablet (25 mg  total) by mouth daily.   nitroGLYCERIN  (NITROSTAT ) 0.4 MG SL tablet Place 1 tablet (0.4 mg total) under the tongue every 5 (five) minutes as needed.   traMADol (ULTRAM) 50 MG tablet Take 50 mg by mouth every 6 (six) hours as needed for moderate pain (pain score 4-6).   [DISCONTINUED] diphenhydrAMINE  (BENADRYL ) 25 mg capsule Take 25 mg by mouth at bedtime.   [DISCONTINUED] metoprolol  succinate (TOPROL -XL) 50 MG 24 hr tablet Take 1 tablet (50 mg total) by mouth daily. Take with or immediately following a meal.   nicotine  (NICODERM CQ  - DOSED IN MG/24 HR) 7 mg/24hr patch Place 1 patch (7 mg total) onto the skin daily. (Patient not taking: Reported on 02/16/2024)   No facility-administered encounter medications on file as of 02/16/2024.     Social History   Tobacco Use   Smoking status: Every Day    Current packs/day: 0.00    Average packs/day: 1 pack/day for 30.0 years (30.0 ttl pk-yrs)    Types: Cigarettes    Start date: 05/21/1981    Last attempt to quit: 05/22/2011    Years since quitting: 12.7   Smokeless tobacco: Never   Tobacco comments:    Started smoking again in back in 2012 per patient on 04/29/23.  Vaping Use   Vaping status: Never Used  Substance Use Topics   Alcohol  use: Yes    Comment: occasional beer   Drug use: No      Review of Systems  Constitutional:  Positive for malaise/fatigue. Negative for diaphoresis, fever and weight loss.  Respiratory:  Negative for cough, shortness of breath and wheezing.   Cardiovascular:  Negative for chest pain, palpitations, orthopnea, claudication, leg swelling and PND.  Psychiatric/Behavioral:  Positive for depression. Negative for hallucinations, substance abuse and suicidal ideas. The patient is nervous/anxious.       Objective:     BP 131/77 (BP Location: Right Arm, Patient Position: Standing, Cuff Size: Normal)   Pulse (!) 55   Temp 98.7 F (37.1 C) (Oral)   Resp 16   Wt 144 lb 4.8 oz (65.5 kg)   SpO2 97%   BMI 24.77 kg/m    Physical Exam Constitutional:      General: She is not in acute distress.    Appearance: Normal appearance.     Comments: Chronically ill appearing  HENT:     Head: Normocephalic.  Neck:     Vascular: No carotid bruit.  Cardiovascular:     Rate and Rhythm: Normal rate and regular rhythm.     Pulses: Normal pulses.     Heart sounds: Normal heart sounds.  Pulmonary:     Effort: Pulmonary effort is normal.     Breath sounds: Normal breath sounds.  Abdominal:     General: Bowel sounds are normal.     Palpations: Abdomen is soft.  Musculoskeletal:     Cervical back: Neck supple. No tenderness.     Right lower leg: No edema.     Left lower leg: No edema.     Comments: Mild lumbago  Neurological:     General:  No focal deficit present.     Mental Status: She is alert.  Psychiatric:     Comments: Somewhat flat affect      No results found for any visits on 02/16/24.    The ASCVD Risk score (Arnett DK, et al., 2019) failed to calculate for the following reasons:   Risk score cannot be calculated because patient has a medical  history suggesting prior/existing ASCVD    Assessment & Plan:  Chronic bilateral low back pain without sciatica -     Ambulatory referral to Pain Clinic  Chronic fatigue Assessment & Plan: Multifactorial.  Check labs and suspect the Duloxetine will help.  Orders: -     TSH -     Vitamin B12  Uncontrolled type 2 diabetes mellitus with hyperglycemia (HCC) Assessment & Plan: Recheck A1c and f/u with her soon.  Orders: -     Hemoglobin A1c  Anxiety Assessment & Plan: Trial of Duloxetine with thorough instructions given.  Eventually increase the dosage as tolerated.  Reevaluate next month.  Orders: -     DULoxetine HCl; Take 1 capsule (30 mg total) by mouth daily.  Dispense: 30 capsule; Refill: 2    Return in about 4 weeks (around 03/15/2024) for chronic follow-up.    REDDING PONCE NORLEEN FALCON., MD

## 2024-02-16 NOTE — Assessment & Plan Note (Signed)
 Multifactorial.  Check labs and suspect the Duloxetine  will help.

## 2024-02-16 NOTE — Assessment & Plan Note (Signed)
 Trial of Duloxetine  with thorough instructions given.  Eventually increase the dosage as tolerated.  Reevaluate next month.

## 2024-02-17 LAB — HEMOGLOBIN A1C
Est. average glucose Bld gHb Est-mCnc: 183 mg/dL
Hgb A1c MFr Bld: 8 % — ABNORMAL HIGH (ref 4.8–5.6)

## 2024-02-17 LAB — TSH: TSH: 0.582 u[IU]/mL (ref 0.450–4.500)

## 2024-02-17 LAB — VITAMIN B12: Vitamin B-12: 391 pg/mL (ref 232–1245)

## 2024-02-17 NOTE — Telephone Encounter (Signed)
 Opened in error

## 2024-02-20 ENCOUNTER — Ambulatory Visit (HOSPITAL_BASED_OUTPATIENT_CLINIC_OR_DEPARTMENT_OTHER): Payer: Self-pay | Admitting: Family Medicine

## 2024-02-22 ENCOUNTER — Ambulatory Visit: Attending: Cardiology

## 2024-02-22 DIAGNOSIS — E782 Mixed hyperlipidemia: Secondary | ICD-10-CM | POA: Diagnosis not present

## 2024-02-23 ENCOUNTER — Emergency Department (HOSPITAL_COMMUNITY)
Admission: EM | Admit: 2024-02-23 | Discharge: 2024-02-23 | Discharge status: Against Medical Advice | Source: Ambulatory Visit | Attending: Emergency Medicine | Admitting: Emergency Medicine

## 2024-02-23 ENCOUNTER — Encounter (HOSPITAL_COMMUNITY): Payer: Self-pay | Admitting: Emergency Medicine

## 2024-02-23 ENCOUNTER — Telehealth: Payer: Self-pay | Admitting: Cardiology

## 2024-02-23 DIAGNOSIS — Z5321 Procedure and treatment not carried out due to patient leaving prior to being seen by health care provider: Secondary | ICD-10-CM | POA: Insufficient documentation

## 2024-02-23 DIAGNOSIS — I959 Hypotension, unspecified: Secondary | ICD-10-CM | POA: Diagnosis not present

## 2024-02-23 LAB — COMPREHENSIVE METABOLIC PANEL WITH GFR
ALT: 10 U/L (ref 0–44)
AST: 15 U/L (ref 15–41)
Albumin: 3.7 g/dL (ref 3.5–5.0)
Alkaline Phosphatase: 67 U/L (ref 38–126)
Anion gap: 13 (ref 5–15)
BUN: 46 mg/dL — ABNORMAL HIGH (ref 8–23)
CO2: 22 mmol/L (ref 22–32)
Calcium: 9.6 mg/dL (ref 8.9–10.3)
Chloride: 107 mmol/L (ref 98–111)
Creatinine, Ser: 1.85 mg/dL — ABNORMAL HIGH (ref 0.44–1.00)
GFR, Estimated: 29 mL/min — ABNORMAL LOW (ref >60)
Glucose, Bld: 123 mg/dL — ABNORMAL HIGH (ref 70–99)
Potassium: 4.8 mmol/L (ref 3.5–5.1)
Sodium: 142 mmol/L (ref 135–145)
Total Bilirubin: 0.8 mg/dL (ref 0.0–1.2)
Total Protein: 6.7 g/dL (ref 6.5–8.1)

## 2024-02-23 LAB — CBC
HCT: 44.1 % (ref 36.0–46.0)
Hemoglobin: 14.8 g/dL (ref 12.0–15.0)
MCH: 31.7 pg (ref 26.0–34.0)
MCHC: 33.6 g/dL (ref 30.0–36.0)
MCV: 94.4 fL (ref 80.0–100.0)
Platelets: 326 K/uL (ref 150–400)
RBC: 4.67 MIL/uL (ref 3.87–5.11)
RDW: 13.2 % (ref 11.5–15.5)
WBC: 11.1 K/uL — ABNORMAL HIGH (ref 4.0–10.5)
nRBC: 0 % (ref 0.0–0.2)

## 2024-02-23 LAB — LIPID PANEL
Chol/HDL Ratio: 3.8 ratio (ref 0.0–4.4)
Cholesterol, Total: 129 mg/dL (ref 100–199)
HDL: 34 mg/dL — ABNORMAL LOW (ref >39)
LDL Chol Calc (NIH): 65 mg/dL (ref 0–99)
Triglycerides: 179 mg/dL — ABNORMAL HIGH (ref 0–149)
VLDL Cholesterol Cal: 30 mg/dL (ref 5–40)

## 2024-02-23 LAB — URINALYSIS, ROUTINE W REFLEX MICROSCOPIC
Bilirubin Urine: NEGATIVE
Glucose, UA: 50 mg/dL — AB
Hgb urine dipstick: NEGATIVE
Ketones, ur: NEGATIVE mg/dL
Leukocytes,Ua: NEGATIVE
Nitrite: NEGATIVE
Protein, ur: NEGATIVE mg/dL
Specific Gravity, Urine: 1.012 (ref 1.005–1.030)
pH: 5 (ref 5.0–8.0)

## 2024-02-23 LAB — AST: AST: 16 IU/L (ref 0–40)

## 2024-02-23 LAB — ALT: ALT: 8 IU/L (ref 0–32)

## 2024-02-23 NOTE — ED Triage Notes (Signed)
 Pt reports she had labs drawn yesterday due to her BP running low at home. They stopped some of her medications and told to come to ER. Norvasc  stopped on 8/1 and told to stop Lisinopril , metoprolol  and hydrochlorothiazide  yesterday.

## 2024-02-23 NOTE — ED Notes (Signed)
 Patient left AMA due to not being able to set here. Patient was not complaining of any pain just stated she can't keep setting here

## 2024-02-23 NOTE — Telephone Encounter (Signed)
 Pt c/o BP issue: STAT if pt c/o blurred vision, one-sided weakness or slurred speech.  STAT if BP is GREATER than 180/120 TODAY.  STAT if BP is LESS than 90/60 and SYMPTOMATIC TODAY  1. What is your BP concern?   See below  2. Have you taken any BP medication today?  No  3. What are your last 5 BP readings?  95/53 - today, 9:00 am 109/60, last night at 7:00 pm  4. Are you having any other symptoms (ex. Dizziness, headache, blurred vision, passed out)?  Dizziness, headache  Patient called to report her BP readings as requested.

## 2024-02-23 NOTE — Telephone Encounter (Signed)
 Patient came to the office yesterday and reported that she had a low blood pressure was light headed and dizzy, using the wall as a support to walk and she states that her brain is fuzzy. A nurse visit was done and Dr. Edwyna recommended the following:  Hold Lisinopril , Hydrochlorothiazide  and Metoprolol  in the am. Go home and have extra salt and drink fluids like Gatorade Zero and rest. Take your blood pressure in the am and call us  with the result.  The patient was made aware of Dr. Posey recommendation and had no further questions.  Called the patient today and she stated that her blood pressure was 96/52 with a heart rate of 62. She was still having the same symptoms that she had yesterday. Spoke to Dr. Revankar and he recommended that the patient go to the ER to be evaluated. I relayed this information to the patient and she stated that she would go to the ER. Patient had no further questions at this time.

## 2024-02-23 NOTE — Progress Notes (Signed)
   Nurse Visit   Date of Encounter: 02/22/24 ID: Paula Hamilton, DOB 06-Nov-1950, MRN [id number]  PCP:  [name], MD   [facility] Providers Cardiologist:  Lamar Fitch, MD  Click to update primary MD,subspecialty MD or APP then REFRESH:1}     Visit Details   VS:  BP 100/62 (BP Location: Left Arm, Patient Position: Sitting, Cuff Size: Normal)   Pulse 68   Resp 20   Wt 144 lb (65.3 kg)   SpO2 97%   BMI 24.72 kg/m  , BMI Body mass index is 24.72 kg/m.  Wt Readings from Last 3 Encounters:  02/23/24 144 lb 6.4 oz (65.5 kg)  02/23/24 144 lb (65.3 kg)  02/16/24 144 lb 4.8 oz (65.5 kg)     Reason for visit: Low blood Pressure, light headed and dizzy, using the wall as a support to walk and she states that her brain is fuzzy Performed today: Vitals Provider consulted and Education Changes (medications, testing, etc.) : Hold Lisinopril , Hydrochlorothiazide  and Metoprolol  in the am. Go home and have extra salt and drink fluids like Gatorade Zero and rest. Take your blood pressure in the am and call us  with the result. Length of Visit: 25 minutes    Medications Adjustments/Labs and Tests Ordered: No orders of the defined types were placed in this encounter.  No orders of the defined types were placed in this encounter.    Signed, Charlie LILLETTE Free, RN  02/23/2024 4:59 PM

## 2024-02-23 NOTE — ED Triage Notes (Signed)
 Patient reports BP of 92/57 this morning, typical BP 130s sys. Patient has hx of MI, appears lethargic at check in.

## 2024-02-24 ENCOUNTER — Ambulatory Visit: Admitting: Gastroenterology

## 2024-02-29 ENCOUNTER — Ambulatory Visit: Payer: Self-pay | Admitting: Cardiology

## 2024-03-01 ENCOUNTER — Ambulatory Visit

## 2024-03-06 ENCOUNTER — Telehealth: Payer: Self-pay

## 2024-03-06 NOTE — Telephone Encounter (Signed)
 Left message on My Chart with lab results per Dr. Karry note. Routed to PCP

## 2024-03-08 DIAGNOSIS — S32009K Unspecified fracture of unspecified lumbar vertebra, subsequent encounter for fracture with nonunion: Secondary | ICD-10-CM | POA: Diagnosis not present

## 2024-03-09 ENCOUNTER — Encounter (HOSPITAL_COMMUNITY): Payer: Self-pay

## 2024-03-09 ENCOUNTER — Inpatient Hospital Stay (HOSPITAL_COMMUNITY)
Admission: AD | Admit: 2024-03-09 | Discharge: 2024-03-14 | DRG: 378 | Disposition: A | Discharge status: Home Health | Source: Other Acute Inpatient Hospital | Attending: Internal Medicine | Admitting: Internal Medicine

## 2024-03-09 DIAGNOSIS — R110 Nausea: Secondary | ICD-10-CM | POA: Diagnosis not present

## 2024-03-09 DIAGNOSIS — E1140 Type 2 diabetes mellitus with diabetic neuropathy, unspecified: Secondary | ICD-10-CM | POA: Diagnosis present

## 2024-03-09 DIAGNOSIS — K254 Chronic or unspecified gastric ulcer with hemorrhage: Secondary | ICD-10-CM | POA: Diagnosis not present

## 2024-03-09 DIAGNOSIS — R42 Dizziness and giddiness: Secondary | ICD-10-CM | POA: Diagnosis not present

## 2024-03-09 DIAGNOSIS — E1151 Type 2 diabetes mellitus with diabetic peripheral angiopathy without gangrene: Secondary | ICD-10-CM | POA: Diagnosis not present

## 2024-03-09 DIAGNOSIS — D649 Anemia, unspecified: Secondary | ICD-10-CM | POA: Diagnosis not present

## 2024-03-09 DIAGNOSIS — Z79899 Other long term (current) drug therapy: Secondary | ICD-10-CM | POA: Diagnosis not present

## 2024-03-09 DIAGNOSIS — Z808 Family history of malignant neoplasm of other organs or systems: Secondary | ICD-10-CM

## 2024-03-09 DIAGNOSIS — Z833 Family history of diabetes mellitus: Secondary | ICD-10-CM | POA: Diagnosis not present

## 2024-03-09 DIAGNOSIS — W1830XA Fall on same level, unspecified, initial encounter: Secondary | ICD-10-CM | POA: Diagnosis present

## 2024-03-09 DIAGNOSIS — Z7985 Long-term (current) use of injectable non-insulin antidiabetic drugs: Secondary | ICD-10-CM | POA: Diagnosis not present

## 2024-03-09 DIAGNOSIS — Z955 Presence of coronary angioplasty implant and graft: Secondary | ICD-10-CM | POA: Diagnosis not present

## 2024-03-09 DIAGNOSIS — F17210 Nicotine dependence, cigarettes, uncomplicated: Secondary | ICD-10-CM | POA: Diagnosis not present

## 2024-03-09 DIAGNOSIS — M25551 Pain in right hip: Secondary | ICD-10-CM | POA: Diagnosis not present

## 2024-03-09 DIAGNOSIS — M810 Age-related osteoporosis without current pathological fracture: Secondary | ICD-10-CM | POA: Diagnosis present

## 2024-03-09 DIAGNOSIS — R0781 Pleurodynia: Secondary | ICD-10-CM | POA: Diagnosis present

## 2024-03-09 DIAGNOSIS — S7001XA Contusion of right hip, initial encounter: Secondary | ICD-10-CM | POA: Diagnosis not present

## 2024-03-09 DIAGNOSIS — E7800 Pure hypercholesterolemia, unspecified: Secondary | ICD-10-CM | POA: Diagnosis not present

## 2024-03-09 DIAGNOSIS — F32A Depression, unspecified: Secondary | ICD-10-CM | POA: Diagnosis present

## 2024-03-09 DIAGNOSIS — D62 Acute posthemorrhagic anemia: Secondary | ICD-10-CM | POA: Diagnosis present

## 2024-03-09 DIAGNOSIS — K219 Gastro-esophageal reflux disease without esophagitis: Secondary | ICD-10-CM | POA: Diagnosis present

## 2024-03-09 DIAGNOSIS — G8929 Other chronic pain: Secondary | ICD-10-CM | POA: Diagnosis present

## 2024-03-09 DIAGNOSIS — Z7982 Long term (current) use of aspirin: Secondary | ICD-10-CM

## 2024-03-09 DIAGNOSIS — Z90710 Acquired absence of both cervix and uterus: Secondary | ICD-10-CM

## 2024-03-09 DIAGNOSIS — E1165 Type 2 diabetes mellitus with hyperglycemia: Secondary | ICD-10-CM | POA: Diagnosis not present

## 2024-03-09 DIAGNOSIS — J449 Chronic obstructive pulmonary disease, unspecified: Secondary | ICD-10-CM | POA: Diagnosis not present

## 2024-03-09 DIAGNOSIS — S0990XA Unspecified injury of head, initial encounter: Secondary | ICD-10-CM | POA: Diagnosis not present

## 2024-03-09 DIAGNOSIS — W06XXXA Fall from bed, initial encounter: Secondary | ICD-10-CM | POA: Diagnosis not present

## 2024-03-09 DIAGNOSIS — E1122 Type 2 diabetes mellitus with diabetic chronic kidney disease: Secondary | ICD-10-CM | POA: Diagnosis present

## 2024-03-09 DIAGNOSIS — Z888 Allergy status to other drugs, medicaments and biological substances status: Secondary | ICD-10-CM

## 2024-03-09 DIAGNOSIS — Z885 Allergy status to narcotic agent status: Secondary | ICD-10-CM

## 2024-03-09 DIAGNOSIS — Y92008 Other place in unspecified non-institutional (private) residence as the place of occurrence of the external cause: Secondary | ICD-10-CM

## 2024-03-09 DIAGNOSIS — K920 Hematemesis: Secondary | ICD-10-CM | POA: Diagnosis not present

## 2024-03-09 DIAGNOSIS — S0003XA Contusion of scalp, initial encounter: Secondary | ICD-10-CM | POA: Diagnosis not present

## 2024-03-09 DIAGNOSIS — R55 Syncope and collapse: Secondary | ICD-10-CM | POA: Diagnosis not present

## 2024-03-09 DIAGNOSIS — Z7984 Long term (current) use of oral hypoglycemic drugs: Secondary | ICD-10-CM | POA: Diagnosis not present

## 2024-03-09 DIAGNOSIS — I959 Hypotension, unspecified: Secondary | ICD-10-CM | POA: Diagnosis present

## 2024-03-09 DIAGNOSIS — R1084 Generalized abdominal pain: Secondary | ICD-10-CM | POA: Diagnosis not present

## 2024-03-09 DIAGNOSIS — E11319 Type 2 diabetes mellitus with unspecified diabetic retinopathy without macular edema: Secondary | ICD-10-CM | POA: Diagnosis not present

## 2024-03-09 DIAGNOSIS — K264 Chronic or unspecified duodenal ulcer with hemorrhage: Secondary | ICD-10-CM | POA: Diagnosis not present

## 2024-03-09 DIAGNOSIS — Z8673 Personal history of transient ischemic attack (TIA), and cerebral infarction without residual deficits: Secondary | ICD-10-CM

## 2024-03-09 DIAGNOSIS — N1832 Chronic kidney disease, stage 3b: Secondary | ICD-10-CM | POA: Diagnosis present

## 2024-03-09 DIAGNOSIS — Z823 Family history of stroke: Secondary | ICD-10-CM | POA: Diagnosis not present

## 2024-03-09 DIAGNOSIS — K922 Gastrointestinal hemorrhage, unspecified: Principal | ICD-10-CM | POA: Diagnosis present

## 2024-03-09 DIAGNOSIS — R404 Transient alteration of awareness: Secondary | ICD-10-CM | POA: Diagnosis not present

## 2024-03-09 DIAGNOSIS — Z7902 Long term (current) use of antithrombotics/antiplatelets: Secondary | ICD-10-CM

## 2024-03-09 DIAGNOSIS — K5900 Constipation, unspecified: Secondary | ICD-10-CM | POA: Diagnosis not present

## 2024-03-09 DIAGNOSIS — I252 Old myocardial infarction: Secondary | ICD-10-CM | POA: Diagnosis not present

## 2024-03-09 DIAGNOSIS — S20221A Contusion of right back wall of thorax, initial encounter: Secondary | ICD-10-CM | POA: Diagnosis not present

## 2024-03-09 DIAGNOSIS — N179 Acute kidney failure, unspecified: Secondary | ICD-10-CM | POA: Diagnosis present

## 2024-03-09 DIAGNOSIS — Z881 Allergy status to other antibiotic agents status: Secondary | ICD-10-CM

## 2024-03-09 DIAGNOSIS — J4489 Other specified chronic obstructive pulmonary disease: Secondary | ICD-10-CM | POA: Diagnosis not present

## 2024-03-09 DIAGNOSIS — I2510 Atherosclerotic heart disease of native coronary artery without angina pectoris: Secondary | ICD-10-CM | POA: Diagnosis not present

## 2024-03-09 DIAGNOSIS — Z825 Family history of asthma and other chronic lower respiratory diseases: Secondary | ICD-10-CM | POA: Diagnosis not present

## 2024-03-09 DIAGNOSIS — I951 Orthostatic hypotension: Secondary | ICD-10-CM | POA: Diagnosis present

## 2024-03-09 DIAGNOSIS — I129 Hypertensive chronic kidney disease with stage 1 through stage 4 chronic kidney disease, or unspecified chronic kidney disease: Secondary | ICD-10-CM | POA: Diagnosis not present

## 2024-03-09 DIAGNOSIS — Z801 Family history of malignant neoplasm of trachea, bronchus and lung: Secondary | ICD-10-CM

## 2024-03-09 DIAGNOSIS — K269 Duodenal ulcer, unspecified as acute or chronic, without hemorrhage or perforation: Secondary | ICD-10-CM | POA: Diagnosis not present

## 2024-03-09 DIAGNOSIS — Z743 Need for continuous supervision: Secondary | ICD-10-CM | POA: Diagnosis not present

## 2024-03-09 DIAGNOSIS — I10 Essential (primary) hypertension: Secondary | ICD-10-CM | POA: Diagnosis not present

## 2024-03-09 DIAGNOSIS — Z880 Allergy status to penicillin: Secondary | ICD-10-CM

## 2024-03-09 DIAGNOSIS — Z800 Family history of malignant neoplasm of digestive organs: Secondary | ICD-10-CM

## 2024-03-09 DIAGNOSIS — Z794 Long term (current) use of insulin: Secondary | ICD-10-CM

## 2024-03-09 DIAGNOSIS — K259 Gastric ulcer, unspecified as acute or chronic, without hemorrhage or perforation: Secondary | ICD-10-CM | POA: Diagnosis not present

## 2024-03-09 DIAGNOSIS — Z8051 Family history of malignant neoplasm of kidney: Secondary | ICD-10-CM

## 2024-03-09 DIAGNOSIS — R54 Age-related physical debility: Secondary | ICD-10-CM | POA: Diagnosis present

## 2024-03-09 DIAGNOSIS — Z803 Family history of malignant neoplasm of breast: Secondary | ICD-10-CM

## 2024-03-09 DIAGNOSIS — W19XXXA Unspecified fall, initial encounter: Secondary | ICD-10-CM | POA: Diagnosis not present

## 2024-03-09 LAB — PROTIME-INR
INR: 1.1 (ref 0.8–1.2)
Prothrombin Time: 15.3 s — ABNORMAL HIGH (ref 11.4–15.2)

## 2024-03-09 LAB — GLUCOSE, CAPILLARY: Glucose-Capillary: 119 mg/dL — ABNORMAL HIGH (ref 70–99)

## 2024-03-09 MED ORDER — HYDROMORPHONE HCL 1 MG/ML IJ SOLN
0.5000 mg | INTRAMUSCULAR | Status: DC | PRN
  Administered 2024-03-09 – 2024-03-11 (×8): 1 mg via INTRAVENOUS
  Administered 2024-03-12: 0.5 mg via INTRAVENOUS
  Filled 2024-03-09 (×9): qty 1

## 2024-03-09 MED ORDER — NICOTINE 7 MG/24HR TD PT24
7.0000 mg | MEDICATED_PATCH | Freq: Every day | TRANSDERMAL | Status: DC
  Filled 2024-03-09 (×6): qty 1

## 2024-03-09 MED ORDER — ONDANSETRON HCL 4 MG PO TABS
4.0000 mg | ORAL_TABLET | Freq: Four times a day (QID) | ORAL | Status: DC | PRN
  Administered 2024-03-10: 4 mg via ORAL
  Filled 2024-03-09: qty 1

## 2024-03-09 MED ORDER — SODIUM CHLORIDE 0.9 % IV BOLUS
1000.0000 mL | Freq: Once | INTRAVENOUS | Status: AC
  Administered 2024-03-09: 1000 mL via INTRAVENOUS

## 2024-03-09 MED ORDER — PANTOPRAZOLE SODIUM 40 MG IV SOLR
40.0000 mg | Freq: Two times a day (BID) | INTRAVENOUS | Status: DC
  Administered 2024-03-09 – 2024-03-12 (×5): 40 mg via INTRAVENOUS
  Filled 2024-03-09 (×5): qty 10

## 2024-03-09 MED ORDER — ONDANSETRON HCL 4 MG/2ML IJ SOLN
4.0000 mg | Freq: Four times a day (QID) | INTRAMUSCULAR | Status: DC | PRN
  Administered 2024-03-10: 4 mg via INTRAVENOUS
  Filled 2024-03-09: qty 2

## 2024-03-09 MED ORDER — LEVETIRACETAM (KEPPRA) 500 MG/5 ML ADULT IV PUSH
500.0000 mg | Freq: Two times a day (BID) | INTRAVENOUS | Status: DC
  Administered 2024-03-09 – 2024-03-12 (×6): 500 mg via INTRAVENOUS
  Filled 2024-03-09 (×6): qty 5

## 2024-03-09 MED ORDER — INSULIN ASPART 100 UNIT/ML IJ SOLN
0.0000 [IU] | INTRAMUSCULAR | Status: DC
  Administered 2024-03-10: 7 [IU] via SUBCUTANEOUS
  Administered 2024-03-11: 2 [IU] via SUBCUTANEOUS
  Administered 2024-03-11: 1 [IU] via SUBCUTANEOUS
  Administered 2024-03-11 (×2): 5 [IU] via SUBCUTANEOUS
  Administered 2024-03-11: 3 [IU] via SUBCUTANEOUS
  Administered 2024-03-12: 1 [IU] via SUBCUTANEOUS
  Administered 2024-03-12: 3 [IU] via SUBCUTANEOUS
  Administered 2024-03-12: 2 [IU] via SUBCUTANEOUS

## 2024-03-09 NOTE — Plan of Care (Signed)
  Problem: Education: Goal: Knowledge of General Education information will improve Description: Including pain rating scale, medication(s)/side effects and non-pharmacologic comfort measures Outcome: Progressing   Problem: Health Behavior/Discharge Planning: Goal: Ability to manage health-related needs will improve Outcome: Progressing   Problem: Clinical Measurements: Goal: Ability to maintain clinical measurements within normal limits will improve Outcome: Progressing Goal: Will remain free from infection Outcome: Progressing Goal: Diagnostic test results will improve Outcome: Progressing Goal: Respiratory complications will improve Outcome: Progressing Goal: Cardiovascular complication will be avoided Outcome: Progressing   Problem: Activity: Goal: Risk for activity intolerance will decrease Outcome: Progressing   Problem: Nutrition: Goal: Adequate nutrition will be maintained Outcome: Progressing   Problem: Coping: Goal: Level of anxiety will decrease Outcome: Progressing   Problem: Elimination: Goal: Will not experience complications related to bowel motility Outcome: Progressing Goal: Will not experience complications related to urinary retention Outcome: Progressing   Problem: Pain Managment: Goal: General experience of comfort will improve and/or be controlled Outcome: Progressing   Problem: Safety: Goal: Ability to remain free from injury will improve Outcome: Progressing   Problem: Skin Integrity: Goal: Risk for impaired skin integrity will decrease Outcome: Progressing   Problem: Education: Goal: Ability to describe self-care measures that may prevent or decrease complications (Diabetes Survival Skills Education) will improve Outcome: Progressing Goal: Individualized Educational Video(s) Outcome: Progressing   Problem: Coping: Goal: Ability to adjust to condition or change in health will improve Outcome: Progressing   Problem: Fluid  Volume: Goal: Ability to maintain a balanced intake and output will improve Outcome: Progressing   Problem: Health Behavior/Discharge Planning: Goal: Ability to identify and utilize available resources and services will improve Outcome: Progressing Goal: Ability to manage health-related needs will improve Outcome: Progressing   Problem: Metabolic: Goal: Ability to maintain appropriate glucose levels will improve Outcome: Progressing   Problem: Nutritional: Goal: Maintenance of adequate nutrition will improve Outcome: Progressing Goal: Progress toward achieving an optimal weight will improve Outcome: Progressing   Problem: Skin Integrity: Goal: Risk for impaired skin integrity will decrease Outcome: Progressing   Problem: Tissue Perfusion: Goal: Adequacy of tissue perfusion will improve Outcome: Progressing   Problem: Education: Goal: Ability to identify signs and symptoms of gastrointestinal bleeding will improve Outcome: Progressing   Problem: Bowel/Gastric: Goal: Will show no signs and symptoms of gastrointestinal bleeding Outcome: Progressing   Problem: Fluid Volume: Goal: Will show no signs and symptoms of excessive bleeding Outcome: Progressing   Problem: Clinical Measurements: Goal: Complications related to the disease process, condition or treatment will be avoided or minimized Outcome: Progressing

## 2024-03-09 NOTE — Progress Notes (Signed)
 Patient who was received in bed from outgoing staff alert, stable and oriented on room air. Patient is not in distress and her vitals are within normal range from baseline. Patient was a direct admit from [facility] ER where she had presented with dizziness, and patient had a fall at home while in the bathroom. Patient also had complaints of nausea and vomiting and she had a large emesis with blood while at the at the ED hence patient was transferred to [facility] for evaluation of GI bleeding. Patient admitted for management of  upper GI bleeding,  blood loss anemia and dizziness.Patient care to continue per plan and recommendations.

## 2024-03-09 NOTE — H&P (Signed)
 History and Physical    Patient: Paula Hamilton FMW:[id number] DOB: 07-31-1950 DOA: 03/09/2024 DOS: the patient was seen and examined on 03/09/2024 PCP: [name], MD  Patient coming from: Outside Hospital  Chief Complaint: Vomiting  blood HPI: Paula Hamilton is a 73 y.o. female with medical history significant of MI in June 2025 status post PCI with 2 stents and dual antiplatelet therapy with aspirin  and Plavix , chronic back pain with multiple surgeries, multiple strokes in the past, seizures, hypertension, hyperlipidemia, chronic smoking who is sent from [facility] ER for evaluation of GI bleeding.  Patient reports feeling dizzy for the past couple of days.  Last night she woke up ,went to the bathroom and fell.  She felt poorly through the night.  In the morning at around 6 AM she had an episode of vomiting.  The vomitus was foamy according to her.  She continued to feel dizzy.  She also felt pain on her right rib cage as well as lower back.  Due to ongoing dizziness and an episode of vomiting she went to the ER.  She says the ER told them that she might have a hairline fracture of her rib and was able to discharge her from the ER.  At that point she had a large emesis with blood.  She was given some fluid and arrangement made for transfer to [facility].  According to the transfer documentation, hemoglobin dropped from 14 to >10.  Patient at the bedside is tachycardic but alert, oriented and conversant.  She denies any chest pain or shortness of breath.  Denies any abdominal pain, fever or chills.  She does feel nauseous.  She denies any dark stools.  She does report of irregular bowel movements.  She states she does have constipation alternating with diarrhea and is being seen by GI with plan for colonoscopy however due to her recent MI and dual antiplatelet therapy this is deferred for a year.  She denies history of liver disease, denies history of alcohol  intake.  Denies NSAID  use.  Vitals on arrival BP 127 over 54 mmHg, heart rate 100/min, SpO2 99% on room air, temperature 98.4 F.  Review of Systems: As mentioned in the history of present illness. All other systems reviewed and are negative. Past Medical History:  Diagnosis Date   Anxiety    Asthma    CAD (coronary artery disease)    with previous MI, f/by [facility]   Chronic pain    due to OA and Neuropathy f/by Dr. Joshua of Neurosurgery   Controlled type 2 diabetes mellitus with neuropathy (HCC)    COPD (chronic obstructive pulmonary disease) (HCC)    Depression    Diabetic retinopathy (HCC)    GERD (gastroesophageal reflux disease)    Hx of migraines    Hypercholesterolemia    Hypertension    Lung nodule    2025, with serial CT scans advised   Osteoporosis    Pneumonia    x 1 yrs ago   Seizures (HCC) 09/2018   w/stroke in 2020   Stroke (HCC) 09/15/2018   multiple, f/by [facility]   Tobacco abuse    Past Surgical History:  Procedure Laterality Date   ABDOMINAL HYSTERECTOMY     APPENDECTOMY     age 34   BACK SURGERY     lower back 4 time, tens surgery too   CERVICAL FUSION     COLONOSCOPY W/ BIOPSIES AND POLYPECTOMY N/A 11/21/2014   f/by  Dr. Charlanne   CORONARY STENT INTERVENTION N/A 12/20/2023   Procedure: CORONARY STENT INTERVENTION;  Surgeon: Swaziland, Peter M, MD;  Location: [facility] INVASIVE CV LAB;  Service: Cardiovascular;  Laterality: N/A;   ESOPHAGOGASTRODUODENOSCOPY  02/26/2016   Moderate gastritis. Otherwise normal EGD    EYE SURGERY     laser surgery on left eye 2023; cataracts removed bilateral 2023   LEFT HEART CATH AND CORONARY ANGIOGRAPHY N/A 12/20/2023   Procedure: LEFT HEART CATH AND CORONARY ANGIOGRAPHY;  Surgeon: Swaziland, Peter M, MD;  Location: [facility] INVASIVE CV LAB;  Service: Cardiovascular;  Laterality: N/A;   MAXIMUM ACCESS (MAS)POSTERIOR LUMBAR INTERBODY FUSION (PLIF) 1 LEVEL N/A 04/12/2013   Procedure: Lumba four-five maximum access surgery Posterior Lumbar Interbody  Fusion;  Surgeon: Alm GORMAN Molt, MD;  Location: [facility] NEURO ORS;  Service: Neurosurgery;  Laterality: N/A;  Lumba four-five maximum access surgery Posterior Lumbar Interbody Fusion   SPINAL CORD STIMULATOR INSERTION N/A 06/14/2014   Procedure: LUMBAR SPINAL CORD STIMULATOR INSERTION;  Surgeon: Deward JAYSON Fabian, MD;  Location: [facility] NEURO ORS;  Service: Neurosurgery;  Laterality: N/A;   SPINAL CORD STIMULATOR REMOVAL N/A 04/12/2017   Procedure: LUMBAR SPINAL CORD STIMULATOR REMOVAL;  Surgeon: Fabian Deward, MD;  Location: [facility] OR;  Service: Neurosurgery;  Laterality: N/A;   TUBAL LIGATION  1984   Social History:  reports that she has been smoking cigarettes. She started smoking about 42 years ago. She has a 30 pack-year smoking history. She has never used smokeless tobacco. She reports current alcohol  use. She reports that she does not use drugs.  Allergies  Allergen Reactions   Codeine Rash and Swelling   Penicillins Rash    PATIENT HAS HAD A PCN REACTION WITH IMMEDIATE RASH, FACIAL/TONGUE/THROAT SWELLING, SOB, OR LIGHTHEADEDNESS WITH HYPOTENSION:  #  #  #  YES  #  #  #   Has patient had a PCN reaction causing severe rash involving mucus membranes or skin necrosis: No Has patient had a PCN reaction that required hospitalization: No Has patient had a PCN reaction occurring within the last 10 years: No    Pregabalin  Other (See Comments)    Suicidal thoughts, extreme depression   Atorvastatin    Doxycycline Swelling   Metformin Other (See Comments)    Profound muscle weakness whren mixed w/actos or januvia   Erythromycin Base Nausea And Vomiting    STOMACH PAIN   Morphine  And Codeine Nausea And Vomiting    Has taken in pill form w/o side effects    Family History  Problem Relation Age of Onset   Breast cancer Mother    Lung cancer Mother    COPD Mother    Melanoma Father    Stroke Sister        in 2   Diabetes Sister    Stroke Brother        in 2   Diabetes Brother    Diabetes Brother     Stomach cancer Paternal Aunt    Colon cancer Paternal Uncle    Kidney cancer Paternal Uncle    Esophageal cancer Neg Hx    Liver disease Neg Hx     Prior to Admission medications   Medication Sig Start Date End Date Taking? Authorizing Provider  aspirin  EC 81 MG tablet Take 1 tablet (81 mg total) by mouth daily. Swallow whole. 12/22/23   Henry Manuelita NOVAK, NP  Bempedoic Acid-Ezetimibe  (NEXLIZET ) 180-10 MG TABS Take 1 tablet by mouth daily. 12/30/23   Carlin Delon JAYSON, NP  Blood Glucose Monitoring Suppl DEVI 1 each by Does not apply route as needed. May substitute to any manufacturer covered by patient's insurance. 01/18/24 04/17/24  [name], MD  clopidogrel  (PLAVIX ) 75 MG tablet Take 1 tablet (75 mg total) by mouth daily with breakfast. 12/22/23   Henry Manuelita NOVAK, NP  diphenoxylate -atropine  (LOMOTIL ) 2.5-0.025 MG tablet Take 1 tablet by mouth 3 (three) times daily as needed for diarrhea or loose stools. 07/11/18   Charlanne Groom, MD  DULoxetine  (CYMBALTA ) 30 MG capsule Take 1 capsule (30 mg total) by mouth daily. 02/16/24   [name], MD  empagliflozin  (JARDIANCE ) 10 MG TABS tablet Take 1 tablet (10 mg total) by mouth daily before breakfast. 12/21/23   Henry Manuelita B, NP  gabapentin  (NEURONTIN ) 300 MG capsule Take 300 mg by mouth daily as needed (pain). 08/23/23   [provider]  hydrochlorothiazide  (MICROZIDE ) 12.5 MG capsule Take 12.5 mg by mouth daily. 02/11/24   [provider]  insulin  glargine (LANTUS  SOLOSTAR) 100 UNIT/ML Solostar Pen Inject 40 Units into the skin in the morning.    [provider]  levETIRAcetam  (KEPPRA ) 500 MG tablet Take 1 tablet (500 mg total) by mouth 2 (two) times daily. 11/20/19   Skeet Juliene SAUNDERS, DO  lisinopril  (ZESTRIL ) 20 MG tablet Take 20 mg by mouth in the morning. 07/31/21   [provider]  metoprolol  succinate (TOPROL -XL) 25 MG 24 hr tablet Take 1 tablet (25 mg total) by mouth daily. 12/22/23   Henry Manuelita NOVAK, NP  nicotine  (NICODERM CQ  - DOSED IN MG/24 HR) 7 mg/24hr patch Place 1 patch (7 mg total) onto the skin daily. Patient not taking: Reported on 02/16/2024 12/28/23   Carlin Delon BROCKS, NP  nitroGLYCERIN  (NITROSTAT ) 0.4 MG SL tablet Place 1 tablet (0.4 mg total) under the tongue every 5 (five) minutes as needed. 12/21/23   Henry Manuelita NOVAK, NP  traMADol (ULTRAM) 50 MG tablet Take 50 mg by mouth every 6 (six) hours as needed for moderate pain (pain score 4-6). 12/26/23   [provider]    Physical Exam: Vitals:   03/09/24 1712  BP: (!) 127/54  Pulse: 100  Resp: 16  Temp: 98.4 F (36.9 C)  TempSrc: Oral  SpO2: 99%   General: Alert oriented not in any acute distress Chest: Clear bilaterally CVS: S1, S2, no murmur, regular rhythm Abdomen: Soft, nontender, bowel sounds present Extremities: No edema Data Reviewed:  Results are pending, will review when available.  Assessment and Plan:  73 year old female with history of recent MI on dual antiplatelet therapy since 12/20/2023, hypertension, chronic back pain, diabetes mellitus is admitted for evaluation of dizziness/episode of hematemesis and acute blood loss anemia.  Upper GI bleeding -Currently on dual antiplatelet therapy for recent MI 12/20/2023. -Patient denies history of liver disease or varices. Will hold aspirin  and Plavix  for now. Currently blood pressure is stable, patient mildly tachycardic.  Will give 1 L fluid bolus. Initiate Protonix  40 mg twice daily Check CBC, CMP stat and H&H every 8 hour Contacted on-call GI, plan to keep n.p.o.  GI will evaluate in the a.m. Need to inform GI if change in status.  Blood loss anemia: Baseline hemoglobin around 14.  Dropped as close to 10. Repeat every 8 hours. Transfuse if less than 8 or if rapid loss.  Dizziness: Likely secondary to above. Patient does report of hypotension recently in addition to her antihypertensives by PCP/cardiology. Will continue to hold  lisinopril , metoprolol , hydrochlorothiazide   which she has not been taking recently.  History of MI s/p stent 12/20/2023: Hold dual antiplatelet therapy Minimize interruption as much as possible  Diabetes mellitus: Will keep n.p.o. and sliding scale insulin  coverage  Full CODE STATUS    Advance Care Planning:   Code Status: Full Code   Consults: GI  Family Communication: No family at the bedside  Severity of Illness: The appropriate patient status for this patient is INPATIENT. Inpatient status is judged to be reasonable and necessary in order to provide the required intensity of service to ensure the patient's safety. The patient's presenting symptoms, physical exam findings, and initial radiographic and laboratory data in the context of their chronic comorbidities is felt to place them at high risk for further clinical deterioration. Furthermore, it is not anticipated that the patient will be medically stable for discharge from the hospital within 2 midnights of admission.   * I certify that at the point of admission it is my clinical judgment that the patient will require inpatient hospital care spanning beyond 2 midnights from the point of admission due to high intensity of service, high risk for further deterioration and high frequency of surveillance required.*  Author: Allysa Governale, MD 03/09/2024 6:17 PM  For on call review [email or web address].

## 2024-03-09 NOTE — Plan of Care (Signed)
 Hospital Medicine Transfer Accept Note Patient Name/Age: Paula Hamilton / 73 y.o. MRN: [id number] Admission Date: (Not on file)  Once successfully transferred to the appropriate floor, TRH will assume care for the patient above.  A/P: 55F h/o CAD s/p PCI x2 in (12/2023), COPD, HTN, HLD, DM2, CVA, and osteoporosis p/w hematemesis c/f UGIB. Pt was at [facility] and noted to have decline in her Hb 14-->10s prior to having large hematemesis that was hemoccult positive. Pt accepted to medicine tele bed and will need IV PPI BID on arrival, as well as GI consult.  Marsha Ada, MD Attending Physician Division of [facility] Medicine [facility] March 09, 2024 12:45 PM

## 2024-03-10 ENCOUNTER — Inpatient Hospital Stay (HOSPITAL_COMMUNITY): Admitting: Anesthesiology

## 2024-03-10 ENCOUNTER — Encounter (HOSPITAL_COMMUNITY)
Admission: AD | Disposition: A | Discharge status: Home Health | Payer: Self-pay | Source: Other Acute Inpatient Hospital | Attending: Hospitalist

## 2024-03-10 ENCOUNTER — Encounter (HOSPITAL_COMMUNITY): Payer: Self-pay | Admitting: Hospitalist

## 2024-03-10 DIAGNOSIS — J449 Chronic obstructive pulmonary disease, unspecified: Secondary | ICD-10-CM | POA: Diagnosis not present

## 2024-03-10 DIAGNOSIS — K259 Gastric ulcer, unspecified as acute or chronic, without hemorrhage or perforation: Secondary | ICD-10-CM

## 2024-03-10 DIAGNOSIS — K920 Hematemesis: Secondary | ICD-10-CM | POA: Diagnosis not present

## 2024-03-10 DIAGNOSIS — I252 Old myocardial infarction: Secondary | ICD-10-CM | POA: Diagnosis not present

## 2024-03-10 DIAGNOSIS — I2510 Atherosclerotic heart disease of native coronary artery without angina pectoris: Secondary | ICD-10-CM

## 2024-03-10 DIAGNOSIS — D62 Acute posthemorrhagic anemia: Secondary | ICD-10-CM

## 2024-03-10 DIAGNOSIS — Z7902 Long term (current) use of antithrombotics/antiplatelets: Secondary | ICD-10-CM | POA: Diagnosis not present

## 2024-03-10 DIAGNOSIS — K254 Chronic or unspecified gastric ulcer with hemorrhage: Secondary | ICD-10-CM

## 2024-03-10 DIAGNOSIS — K264 Chronic or unspecified duodenal ulcer with hemorrhage: Secondary | ICD-10-CM | POA: Diagnosis not present

## 2024-03-10 HISTORY — PX: ESOPHAGOGASTRODUODENOSCOPY: SHX5428

## 2024-03-10 HISTORY — PX: HEMOSTASIS CLIP PLACEMENT: SHX6857

## 2024-03-10 LAB — GLUCOSE, CAPILLARY
Glucose-Capillary: 101 mg/dL — ABNORMAL HIGH (ref 70–99)
Glucose-Capillary: 108 mg/dL — ABNORMAL HIGH (ref 70–99)
Glucose-Capillary: 111 mg/dL — ABNORMAL HIGH (ref 70–99)
Glucose-Capillary: 126 mg/dL — ABNORMAL HIGH (ref 70–99)
Glucose-Capillary: 331 mg/dL — ABNORMAL HIGH (ref 70–99)
Glucose-Capillary: 90 mg/dL (ref 70–99)

## 2024-03-10 LAB — BASIC METABOLIC PANEL WITH GFR
Anion gap: 5 (ref 5–15)
BUN: 63 mg/dL — ABNORMAL HIGH (ref 8–23)
CO2: 19 mmol/L — ABNORMAL LOW (ref 22–32)
Calcium: 8.5 mg/dL — ABNORMAL LOW (ref 8.9–10.3)
Chloride: 120 mmol/L — ABNORMAL HIGH (ref 98–111)
Creatinine, Ser: 1.38 mg/dL — ABNORMAL HIGH (ref 0.44–1.00)
GFR, Estimated: 41 mL/min — ABNORMAL LOW (ref >60)
Glucose, Bld: 101 mg/dL — ABNORMAL HIGH (ref 70–99)
Potassium: 4 mmol/L (ref 3.5–5.1)
Sodium: 144 mmol/L (ref 135–145)

## 2024-03-10 LAB — CBC
HCT: 21.7 % — ABNORMAL LOW (ref 36.0–46.0)
Hemoglobin: 7.2 g/dL — ABNORMAL LOW (ref 12.0–15.0)
MCH: 32 pg (ref 26.0–34.0)
MCHC: 33.2 g/dL (ref 30.0–36.0)
MCV: 96.4 fL (ref 80.0–100.0)
Platelets: 230 K/uL (ref 150–400)
RBC: 2.25 MIL/uL — ABNORMAL LOW (ref 3.87–5.11)
RDW: 13.6 % (ref 11.5–15.5)
WBC: 12.1 K/uL — ABNORMAL HIGH (ref 4.0–10.5)
nRBC: 0.2 % (ref 0.0–0.2)

## 2024-03-10 LAB — PREPARE RBC (CROSSMATCH)

## 2024-03-10 LAB — HEMOGLOBIN AND HEMATOCRIT, BLOOD
HCT: 27.4 % — ABNORMAL LOW (ref 36.0–46.0)
Hemoglobin: 9.1 g/dL — ABNORMAL LOW (ref 12.0–15.0)

## 2024-03-10 SURGERY — EGD (ESOPHAGOGASTRODUODENOSCOPY)
Anesthesia: Monitor Anesthesia Care

## 2024-03-10 MED ORDER — SUCCINYLCHOLINE CHLORIDE 200 MG/10ML IV SOSY
PREFILLED_SYRINGE | INTRAVENOUS | Status: DC | PRN
  Administered 2024-03-10: 120 mg via INTRAVENOUS

## 2024-03-10 MED ORDER — SODIUM CHLORIDE 0.9% FLUSH
10.0000 mL | Freq: Two times a day (BID) | INTRAVENOUS | Status: DC
  Administered 2024-03-10 – 2024-03-14 (×8): 10 mL

## 2024-03-10 MED ORDER — LIDOCAINE 2% (20 MG/ML) 5 ML SYRINGE
INTRAMUSCULAR | Status: DC | PRN
  Administered 2024-03-10: 60 mg via INTRAVENOUS

## 2024-03-10 MED ORDER — SODIUM CHLORIDE 0.9% IV SOLUTION: Freq: Once | INTRAVENOUS | Status: DC

## 2024-03-10 MED ORDER — PROPOFOL 500 MG/50ML IV EMUL
INTRAVENOUS | Status: DC | PRN
  Administered 2024-03-10: 30 mg via INTRAVENOUS
  Administered 2024-03-10: 150 ug/kg/min via INTRAVENOUS

## 2024-03-10 MED ORDER — DEXAMETHASONE SODIUM PHOSPHATE 10 MG/ML IJ SOLN
INTRAMUSCULAR | Status: DC | PRN
  Administered 2024-03-10: 5 mg via INTRAVENOUS

## 2024-03-10 MED ORDER — PHENYLEPHRINE 80 MCG/ML (10ML) SYRINGE FOR IV PUSH (FOR BLOOD PRESSURE SUPPORT)
PREFILLED_SYRINGE | INTRAVENOUS | Status: DC | PRN
  Administered 2024-03-10: 80 ug via INTRAVENOUS
  Administered 2024-03-10 (×2): 160 ug via INTRAVENOUS
  Administered 2024-03-10: 80 ug via INTRAVENOUS

## 2024-03-10 MED ORDER — PROPOFOL 10 MG/ML IV BOLUS
INTRAVENOUS | Status: DC | PRN
  Administered 2024-03-10: 120 mg via INTRAVENOUS
  Administered 2024-03-10: 30 mg via INTRAVENOUS

## 2024-03-10 MED ORDER — LACTATED RINGERS IV SOLN: INTRAVENOUS | Status: AC

## 2024-03-10 MED ORDER — ONDANSETRON HCL 4 MG/2ML IJ SOLN
INTRAMUSCULAR | Status: DC | PRN
  Administered 2024-03-10: 4 mg via INTRAVENOUS

## 2024-03-10 MED ORDER — SODIUM CHLORIDE 0.9% FLUSH: 10.0000 mL | INTRAVENOUS | Status: DC | PRN

## 2024-03-10 MED ORDER — SODIUM CHLORIDE 0.9 % IV SOLN: INTRAVENOUS | Status: DC

## 2024-03-10 NOTE — Anesthesia Procedure Notes (Signed)
 Procedure Name: Intubation Date/Time: 03/10/2024 2:26 PM  Performed by: Claudene Arlin LABOR, CRNAPre-anesthesia Checklist: Patient identified, Emergency Drugs available, Suction available and Patient being monitored Patient Re-evaluated:Patient Re-evaluated prior to induction Oxygen Delivery Method: Circle system utilized Preoxygenation: Pre-oxygenation with 100% oxygen Induction Type: IV induction, Rapid sequence and Cricoid Pressure applied Laryngoscope Size: Miller and 2 Grade View: Grade I Tube type: Oral Tube size: 7.0 mm Number of attempts: 1 Airway Equipment and Method: Stylet Placement Confirmation: ETT inserted through vocal cords under direct vision, positive ETCO2 and breath sounds checked- equal and bilateral Secured at: 21 cm Tube secured with: Tape Dental Injury: Teeth and Oropharynx as per pre-operative assessment

## 2024-03-10 NOTE — Anesthesia Preprocedure Evaluation (Addendum)
 Anesthesia Evaluation  Patient identified by MRN, date of birth, ID band Patient awake    Reviewed: Allergy & Precautions, NPO status , Patient's Chart, lab work & pertinent test results  History of Anesthesia Complications Negative for: history of anesthetic complications  Airway Mallampati: III  TM Distance: >3 FB Neck ROM: Full    Dental  (+) Edentulous Upper, Edentulous Lower   Pulmonary asthma , COPD,  COPD inhaler, Current Smoker   Pulmonary exam normal        Cardiovascular hypertension, Pt. on home beta blockers and Pt. on medications + CAD, + Past MI, + Cardiac Stents and + Peripheral Vascular Disease  Normal cardiovascular exam   '25 TTE - EF 65 to 70%. There is mild left ventricular hypertrophy. Grade I diastolic dysfunction (impaired relaxation). Mild mitral valve regurgitation.     Neuro/Psych Seizures -, Well Controlled,  PSYCHIATRIC DISORDERS Anxiety Depression     Neuromuscular disease CVA, Residual Symptoms    GI/Hepatic Neg liver ROS,GERD  Controlled,,  Endo/Other  diabetes, Type 2, Insulin  Dependent   Na 144 Cl 120   Renal/GU CRFRenal disease     Musculoskeletal negative musculoskeletal ROS (+)    Abdominal   Peds  Hematology  (+) Blood dyscrasia, anemia  On plavix     Anesthesia Other Findings Chronic pain   Reproductive/Obstetrics                              Anesthesia Physical Anesthesia Plan  ASA: 4  Anesthesia Plan: General   Post-op Pain Management: Minimal or no pain anticipated   Induction: Intravenous and Rapid sequence  PONV Risk Score and Plan: 2 and Treatment may vary due to age or medical condition, Ondansetron  and TIVA  Airway Management Planned: Oral ETT  Additional Equipment: None  Intra-op Plan:   Post-operative Plan: Extubation in OR  Informed Consent: I have reviewed the patients History and Physical, chart, labs and discussed the  procedure including the risks, benefits and alternatives for the proposed anesthesia with the patient or authorized representative who has indicated his/her understanding and acceptance.     Dental advisory given  Plan Discussed with: CRNA and Anesthesiologist  Anesthesia Plan Comments: (Actively nauseous, will RSI MI w/ 2 stents placed in June )         Anesthesia Quick Evaluation

## 2024-03-10 NOTE — Progress Notes (Signed)
 PROGRESS NOTE    Paula Hamilton  FMW:[id number] DOB: 1950-12-20 DOA: 03/09/2024 PCP: [name], MD   Brief Narrative:    Assessment & Plan:   Principal Problem:   GI bleeding Active Problems:   Hematemesis with nausea   ABLA (acute blood loss anemia)   Gastric ulcer with hemorrhage   73 year old female with history of recent MI on dual antiplatelet therapy since 12/20/2023, hypertension, chronic back pain, diabetes mellitus is admitted for evaluation of dizziness/episode of hematemesis and acute blood loss anemia.   Upper GI bleeding - on dual antiplatelet therapy for recent MI 12/20/2023. - Evaluated by GI.  Upper GI endoscopy performed today.  1 nonbleeding cratered gastric ulcer noted which was clipped.  Likely source of bleeding. -Will start clear liquid diet.  Continue to hold aspirin  and Plavix  per GI recommendations. -Doing a unit PRBC transfusion today, hemoglobin 7 point 1 in the morning. Will recheck hemoglobin after transfusion   acute Blood loss anemia: Baseline hemoglobin around 14.  Dropped as low as 7.1 1 unit PRBC transfusion today.  Dizziness: Likely secondary to above. Patient does report of hypotension recently in addition to her antihypertensives by PCP/cardiology. Will continue to hold lisinopril , metoprolol , hydrochlorothiazide  which she has not been taking recently.   History of MI s/p stent 12/20/2023: Hold dual antiplatelet therapy Minimize interruption as much as possible   Diabetes mellitus: insulin  sliding scale insulin  coverage   Full CODE STATUS      Advance Care Planning:   Code Status: Full Code    Consults: GI   Family Communication: No family at the bedside   Severity of Illness: The appropriate patient status for this patient is INPATIENT. Inpatient status is judged to be reasonable and necessary in order to provide the required intensity of service to ensure the patient's safety. The patient's presenting symptoms,  physical exam findings, and initial radiographic and laboratory data in the context of their chronic comorbidities is felt to place them at high risk for further clinical deterioration. Furthermore, it is not anticipated that the patient will be medically stable for discharge from the hospital within 2 midnights of admission.    * I certify that at the point of admission it is my clinical judgment that the patient will require inpatient hospital care spanning beyond 2 midnights from the point of admission due to high intensity of service, high risk for further deterioration and high frequency of surveillance required.*      Subjective:  Patient seen and examined at the bedside earlier today.  She continued to complain of some dizziness and also nausea today.  Denied any vomiting.  Denied any abdominal pain fever or chills.  Objective: Vitals:   03/10/24 1510 03/10/24 1514 03/10/24 1516 03/10/24 1556  BP: (!) 102/40  (!) 112/40 (!) 123/29  Pulse: 83 81 83 86  Resp: 17 19 18    Temp:    99.1 F (37.3 C)  TempSrc:    Oral  SpO2: 92% 92% 90% 94%    Intake/Output Summary (Last 24 hours) at 03/10/2024 1608 Last data filed at 03/10/2024 1509 Gross per 24 hour  Intake 410 ml  Output --  Net 410 ml   There were no vitals filed for this visit.  Examination:  General exam: Appears calm and comfortable  Respiratory system: Bilateral decreased breath sounds at bases Cardiovascular system: S1 & S2 heard, Rate controlled Gastrointestinal system: Abdomen is nondistended, soft and nontender. Normal bowel sounds heard. Extremities: No cyanosis, clubbing,  edema  Central nervous system: Alert and oriented. No focal neurological deficits. Moving extremities Skin: No rashes, lesions or ulcers Psychiatry: Judgement and insight appear normal. Mood & affect appropriate.     Data Reviewed: I have personally reviewed following labs and imaging studies  CBC: Recent Labs  Lab 03/10/24 0239  WBC  12.1*  HGB 7.2*  HCT 21.7*  MCV 96.4  PLT 230   Basic Metabolic Panel: Recent Labs  Lab 03/10/24 0239  NA 144  K 4.0  CL 120*  CO2 19*  GLUCOSE 101*  BUN 63*  CREATININE 1.38*  CALCIUM 8.5*   GFR: CrCl cannot be calculated (Unknown ideal weight.). Liver Function Tests: No results for input(s): AST, ALT, ALKPHOS, BILITOT, PROT, ALBUMIN  in the last 168 hours. No results for input(s): LIPASE, AMYLASE in the last 168 hours. No results for input(s): AMMONIA in the last 168 hours. Coagulation Profile: Recent Labs  Lab 03/09/24 1759  INR 1.1   Cardiac Enzymes: No results for input(s): CKTOTAL, CKMB, CKMBINDEX, TROPONINI in the last 168 hours. BNP (last 3 results) No results for input(s): PROBNP in the last 8760 hours. HbA1C: No results for input(s): HGBA1C in the last 72 hours. CBG: Recent Labs  Lab 03/09/24 1943 03/09/24 2358 03/10/24 0416 03/10/24 0749 03/10/24 1222  GLUCAP 119* 90 108* 111* 101*   Lipid Profile: No results for input(s): CHOL, HDL, LDLCALC, TRIG, CHOLHDL, LDLDIRECT in the last 72 hours. Thyroid  Function Tests: No results for input(s): TSH, T4TOTAL, FREET4, T3FREE, THYROIDAB in the last 72 hours. Anemia Panel: No results for input(s): VITAMINB12, FOLATE, FERRITIN, TIBC, IRON, RETICCTPCT in the last 72 hours. Sepsis Labs: No results for input(s): PROCALCITON, LATICACIDVEN in the last 168 hours.  No results found for this or any previous visit (from the past 240 hours).       Radiology Studies: No results found.      Scheduled Meds:  sodium chloride    Intravenous Once   insulin  aspart  0-9 Units Subcutaneous Q4H   levETIRAcetam   500 mg Intravenous Q12H   nicotine   7 mg Transdermal Daily   pantoprazole  (PROTONIX ) IV  40 mg Intravenous Q12H   sodium chloride  flush  10-40 mL Intracatheter Q12H   Continuous Infusions:        Rad Gramling, MD [facility] 03/10/2024, 4:08 PM

## 2024-03-10 NOTE — Transfer of Care (Signed)
 Immediate Anesthesia Transfer of Care Note  Patient: Paula Hamilton  Procedure(s) Performed: EGD (ESOPHAGOGASTRODUODENOSCOPY) CONTROL OF HEMORRHAGE, GI TRACT, ENDOSCOPIC, BY CLIPPING OR OVERSEWING  Patient Location: PACU  Anesthesia Type:MAC  Level of Consciousness: drowsy and responds to stimulation  Airway & Oxygen Therapy: Patient Spontanous Breathing  Post-op Assessment: Report given to RN and Post -op Vital signs reviewed and stable  Post vital signs: Reviewed and stable  Last Vitals:  Vitals Value Taken Time  BP 96/36 03/10/24 15:05  Temp 37.1 C 03/10/24 15:05  Pulse 81 03/10/24 15:08  Resp 19 03/10/24 15:08  SpO2 91 % 03/10/24 15:08  Vitals shown include unfiled device data.  Last Pain:  Vitals:   03/10/24 1505  TempSrc: Axillary  PainSc: Asleep      Patients Stated Pain Goal: 0 (03/09/24 2000)  Complications: No notable events documented.

## 2024-03-10 NOTE — Consult Note (Signed)
 Consultation Note   Referring Provider:  [facility] PCP: [name], MD Primary Gastroenterologist:   Lynnie Bring, MD Reason for Consultation: GI bleed DOA: 03/09/2024         Hospital Day: 2   ASSESSMENT    73 year old female transferred from [facility] ED for upper GI bleed with hematemesis on aspirin  and Plavix  ( and chronic Goody powder use).  Rule out PUD Patient is hemodynamically stable  Acute blood loss anemia Hemoglobin 7.2, down from baseline of around 14  Dizziness Fall at home ?  Hairline right rib cage fracture  Likely secondary to above.  BP meds on hold.   CAD status post DES June 2025 on aspirin  and Plavix  Last dose of Plavix  was morning of 03/08/2024  Multiple CVAs  DM2  CKD  Osteoporosis  Tobacco abuse  See PMH for additional medical problems  Principal Problem:   GI bleeding      PLAN:   --Continue twice daily IV pantoprazole  --Monitor H&H --Keep n.p.o. --Scheduled for EGD. The risks and benefits of EGD with possible biopsies were discussed with the patient who agrees to proceed.  Endoscopic interventions may be limited given that she has been on Plavix     HPI   Brief GI history  Ms. Woolridge was last seen in our office on 01/03/2024 for evaluation of lower abdominal pain, alternating bowel habits.  We ordered a CT scan, started her on fiber and recommended office follow-up .  CT scan without any acute findings.  There was a stable lung nodule.    Interval history  Patient presented to [facility] ED yesterday following a fall at home .  She does not remember much about the fall except that she woke up dizzy .  At some point she vomited  (nonbloody emesis ).   While in [facility] ED she had an episode of emesis containing what her husband describes as dark red blood.  According to H&P her hemoglobin at [facility] was 10 ( down from baseline of 14).  She has not had any  associated abdominal pain.  No red blood in stools or black stools.  She takes [employer] on a regular basis.  For further management patient was transferred to [facility].   Since arrival at [facility] she has been hemodynamically stable . Her hemoglobin is down to 7.2, MCV 96.  BUN 63, creatinine 1.38.   Jesenia has not had any further episodes of nausea, vomiting.  Still no evidence for blood in stool or melena.    Pertinent GI Studies   January 2020 colonoscopy Done for evaluation of chronic diarrhea - Mild sigmoid diverticulosis - Patchy erythema suggestive of resolving acute colitis biopsied - Otherwise normal colonoscopy to TI. Surgical [P], terminal ileum - BENIGN SMALL BOWEL MUCOSA. - NO VILLOUS ATROPHY, INFLAMMATION OR OTHER ABNORMALITIES PRESENT. 2. Surgical [P], random sites - BENIGN COLONIC MUCOSA. - NO SIGNIFICANT INFLAMMATION OR OTHER ABNORMALITIES IDENTIFIED.  Labs and Imaging:  Recent Labs    03/10/24 0239  WBC 12.1*  HGB 7.2*  HCT 21.7*  MCV 96.4  PLT 230   Recent Labs    03/10/24 0239  NA 144  K 4.0  CL 120*  CO2 19*  GLUCOSE  101*  BUN 63*  CREATININE 1.38*  CALCIUM 8.5*     CT ABDOMEN PELVIS W CONTRAST CLINICAL DATA:  Lower abdominal pain.  EXAM: CT ABDOMEN AND PELVIS WITH CONTRAST  TECHNIQUE: Multidetector CT imaging of the abdomen and pelvis was performed using the standard protocol following bolus administration of intravenous contrast.  RADIATION DOSE REDUCTION: This exam was performed according to the departmental dose-optimization program which includes automated exposure control, adjustment of the mA and/or kV according to patient size and/or use of iterative reconstruction technique.  CONTRAST:  OMNIPAQUE  IOHEXOL  300 MG/ML  SOLN  COMPARISON:  11/22/2022  FINDINGS: Lower Chest: 9 x 4 mm irregular pulmonary nodule in the inferior right middle lobe shows no significant change compared to prior study.  Hepatobiliary:  No suspicious hepatic masses identified. Gallbladder is unremarkable. No evidence of biliary ductal dilatation.  Pancreas:  No mass or inflammatory changes.  Spleen: Within normal limits in size and appearance.  Adrenals/Urinary Tract: No suspicious masses identified. No evidence of ureteral calculi or hydronephrosis. Unremarkable unopacified urinary bladder.  Stomach/Bowel: No evidence of obstruction, inflammatory process or abnormal fluid collections.  Vascular/Lymphatic: No pathologically enlarged lymph nodes. No acute vascular findings.  Reproductive: Prior hysterectomy noted. Adnexal regions are unremarkable in appearance.  Other:  None.  Musculoskeletal: No suspicious bone lesions identified. Lumbar spine fusion hardware again noted.  IMPRESSION: No acute findings within the abdomen or pelvis.  Stable 9 x 4 mm irregular pulmonary nodule in the inferior right middle lobe. Recommend continued follow-up by chest CT in 12 months. This recommendation follows the consensus statement: Guidelines for Management of Small Pulmonary Nodules Detected on CT Images: From the Fleischner Society 2017; Radiology 2017; 284:228-243.  Electronically Signed   By: Norleen DELENA Kil M.D.   On: 01/13/2024 10:38           Past Medical History:  Diagnosis Date   Anxiety    Asthma    CAD (coronary artery disease)    with previous MI, f/by [facility]   Chronic pain    due to OA and Neuropathy f/by Dr. Joshua of Neurosurgery   Controlled type 2 diabetes mellitus with neuropathy (HCC)    COPD (chronic obstructive pulmonary disease) (HCC)    Depression    Diabetic retinopathy (HCC)    GERD (gastroesophageal reflux disease)    Hx of migraines    Hypercholesterolemia    Hypertension    Lung nodule    2025, with serial CT scans advised   Osteoporosis    Pneumonia    x 1 yrs ago   Seizures (HCC) 09/2018   w/stroke in 2020   Stroke [facility]) 09/15/2018   multiple, f/by [facility]    Tobacco abuse     Past Surgical History:  Procedure Laterality Date   ABDOMINAL HYSTERECTOMY     APPENDECTOMY     age 73   BACK SURGERY     lower back 4 time, tens surgery too   CERVICAL FUSION     COLONOSCOPY W/ BIOPSIES AND POLYPECTOMY N/A 11/21/2014   f/by Dr. Charlanne   CORONARY STENT INTERVENTION N/A 12/20/2023   Procedure: CORONARY STENT INTERVENTION;  Surgeon: Swaziland, Peter M, MD;  [facility]: [facility] INVASIVE CV LAB;  Service: Cardiovascular;  Laterality: N/A;   ESOPHAGOGASTRODUODENOSCOPY  02/26/2016   Moderate gastritis. Otherwise normal EGD    EYE SURGERY     laser surgery on left eye 2023; cataracts removed bilateral 2023   LEFT HEART CATH AND CORONARY ANGIOGRAPHY N/A 12/20/2023  Procedure: LEFT HEART CATH AND CORONARY ANGIOGRAPHY;  Surgeon: Swaziland, Peter M, MD;  Location: [facility] INVASIVE CV LAB;  Service: Cardiovascular;  Laterality: N/A;   MAXIMUM ACCESS (MAS)POSTERIOR LUMBAR INTERBODY FUSION (PLIF) 1 LEVEL N/A 04/12/2013   Procedure: Lumba four-five maximum access surgery Posterior Lumbar Interbody Fusion;  Surgeon: Alm GORMAN Molt, MD;  Location: [facility] NEURO ORS;  Service: Neurosurgery;  Laterality: N/A;  Lumba four-five maximum access surgery Posterior Lumbar Interbody Fusion   SPINAL CORD STIMULATOR INSERTION N/A 06/14/2014   Procedure: LUMBAR SPINAL CORD STIMULATOR INSERTION;  Surgeon: Deward JAYSON Fabian, MD;  Location: [facility] NEURO ORS;  Service: Neurosurgery;  Laterality: N/A;   SPINAL CORD STIMULATOR REMOVAL N/A 04/12/2017   Procedure: LUMBAR SPINAL CORD STIMULATOR REMOVAL;  Surgeon: Fabian Deward, MD;  Location: [facility] OR;  Service: Neurosurgery;  Laterality: N/A;   TUBAL LIGATION  1984    Family History  Problem Relation Age of Onset   Breast cancer Mother    Lung cancer Mother    COPD Mother    Melanoma Father    Stroke Sister        in 2   Diabetes Sister    Stroke Brother        in 2   Diabetes Brother    Diabetes Brother    Stomach cancer Paternal Aunt    Colon cancer  Paternal Uncle    Kidney cancer Paternal Uncle    Esophageal cancer Neg Hx    Liver disease Neg Hx     Prior to Admission medications   Medication Sig Start Date End Date Taking? Authorizing Provider  aspirin  EC 81 MG tablet Take 1 tablet (81 mg total) by mouth daily. Swallow whole. 12/22/23  Yes Henry Manuelita NOVAK, NP  clopidogrel  (PLAVIX ) 75 MG tablet Take 1 tablet (75 mg total) by mouth daily with breakfast. 12/22/23  Yes Henry Manuelita NOVAK, NP  diphenoxylate -atropine  (LOMOTIL ) 2.5-0.025 MG tablet Take 1 tablet by mouth 3 (three) times daily as needed for diarrhea or loose stools. 07/11/18  Yes Charlanne Groom, MD  empagliflozin  (JARDIANCE ) 10 MG TABS tablet Take 1 tablet (10 mg total) by mouth daily before breakfast. 12/21/23  Yes Henry Manuelita B, NP  ezetimibe  (ZETIA ) 10 MG tablet Take 10 mg by mouth daily.   Yes [provider]  hydrochlorothiazide  (MICROZIDE ) 12.5 MG capsule Take 12.5 mg by mouth daily. 02/11/24  Yes [provider]  insulin  glargine (LANTUS  SOLOSTAR) 100 UNIT/ML Solostar Pen Inject 40 Units into the skin in the morning.   Yes [provider]  levETIRAcetam  (KEPPRA ) 500 MG tablet Take 1 tablet (500 mg total) by mouth 2 (two) times daily. 11/20/19  Yes Jaffe, Adam R, DO  lisinopril  (ZESTRIL ) 20 MG tablet Take 20 mg by mouth in the morning. 07/31/21  Yes [provider]  metoprolol  succinate (TOPROL -XL) 25 MG 24 hr tablet Take 1 tablet (25 mg total) by mouth daily. 12/22/23  Yes Henry Manuelita NOVAK, NP  nitroGLYCERIN  (NITROSTAT ) 0.4 MG SL tablet Place 1 tablet (0.4 mg total) under the tongue every 5 (five) minutes as needed. Patient taking differently: Place 0.4 mg under the tongue every 5 (five) minutes as needed for chest pain. 12/21/23  Yes Henry Manuelita NOVAK, NP  Bempedoic Acid-Ezetimibe  (NEXLIZET ) 180-10 MG TABS Take 1 tablet by mouth daily. Patient not taking: Reported on 03/09/2024 12/30/23   Carlin Delon JAYSON, NP  Blood Glucose Monitoring  Suppl DEVI 1 each by Does not apply route as needed. May substitute to any manufacturer  covered by patient's insurance. 01/18/24 04/17/24  [name], MD  DULoxetine  (CYMBALTA ) 30 MG capsule Take 1 capsule (30 mg total) by mouth daily. Patient not taking: Reported on 03/09/2024 02/16/24   [name], MD    Current Facility-Administered Medications  Medication Dose Route Frequency Provider Last Rate Last Admin   0.9 %  sodium chloride  infusion (Manually program via Guardrails IV Fluids)   Intravenous Once Howerter, Justin B, DO       HYDROmorphone  (DILAUDID ) injection 0.5-1 mg  0.5-1 mg Intravenous Q2H PRN Sigdel, Santosh, MD   1 mg at 03/10/24 9351   insulin  aspart (novoLOG ) injection 0-9 Units  0-9 Units Subcutaneous Q4H Sigdel, Santosh, MD       lactated ringers  infusion   Intravenous Continuous Howerter, Justin B, DO 75 mL/hr at 03/10/24 0021 New Bag at 03/10/24 0021   levETIRAcetam  (KEPPRA ) undiluted injection 500 mg  500 mg Intravenous Q12H Sigdel, Santosh, MD   500 mg at 03/10/24 9357   nicotine  (NICODERM CQ  - dosed in mg/24 hr) patch 7 mg  7 mg Transdermal Daily Sigdel, Santosh, MD       ondansetron  (ZOFRAN ) tablet 4 mg  4 mg Oral Q6H PRN Sigdel, Santosh, MD       Or   ondansetron  (ZOFRAN ) injection 4 mg  4 mg Intravenous Q6H PRN Sigdel, Santosh, MD   4 mg at 03/10/24 9972   pantoprazole  (PROTONIX ) injection 40 mg  40 mg Intravenous Q12H Sigdel, Derryl, MD   40 mg at 03/09/24 1829    Allergies as of 03/09/2024 - Review Complete 03/09/2024  Allergen Reaction Noted   Codeine Rash and Swelling 11/17/2012   Penicillins Rash 11/17/2012   Pregabalin  Other (See Comments) 06/13/2015   Atorvastatin  08/20/2021   Doxycycline Swelling 08/20/2021   Metformin Other (See Comments) 08/20/2021   Erythromycin base Nausea And Vomiting 12/19/2013   Morphine  and codeine Nausea And Vomiting 11/17/2012    Social History   Socioeconomic History   Marital status: Widowed    Spouse  name: Not on file   Number of children: 2   Years of education: Not on file   Highest education level: Some college, no degree  Occupational History    Comment: retired   Occupation: retired  Tobacco Use   Smoking status: Every Day    Current packs/day: 0.00    Average packs/day: 1 pack/day for 30.0 years (30.0 ttl pk-yrs)    Types: Cigarettes    Start date: 05/21/1981    Last attempt to quit: 05/22/2011    Years since quitting: 12.8   Smokeless tobacco: Never   Tobacco comments:    Started smoking again in back in 2012 per patient on 04/29/23.  Vaping Use   Vaping status: Never Used  Substance and Sexual Activity   Alcohol  use: Yes    Comment: occasional beer   Drug use: No   Sexual activity: Yes    Birth control/protection: Surgical    Comment: Hysterectomy  Other Topics Concern   Not on file  Social History Narrative   Lives with sister, bro-in-law   Caffeine- not much   Social Drivers of Corporate investment banker Strain: Not on file  Food Insecurity: No Food Insecurity (03/09/2024)   Hunger Vital Sign    Worried About Running Out of Food in the Last Year: Never true    Ran Out of Food in the Last Year: Never true  Transportation Needs: No Transportation Needs (03/09/2024)  PRAPARE - Administrator, Civil Service (Medical): No    Lack of Transportation (Non-Medical): No  Physical Activity: Not on file  Stress: Not on file  Social Connections: Socially Integrated (03/09/2024)   Social Connection and Isolation Panel    Frequency of Communication with Friends and Family: Twice a week    Frequency of Social Gatherings with Friends and Family: Twice a week    Attends Religious Services: 1 to 4 times per year    Active Member of [employer] or Organizations: No    Attends Engineer, structural: 1 to 4 times per year    Marital Status: Married  Catering manager Violence: Not At Risk (03/09/2024)   Humiliation, Afraid, Rape, and Kick questionnaire    Fear of  Current or Ex-Partner: No    Emotionally Abused: No    Physically Abused: No    Sexually Abused: No     Code Status   Code Status: Full Code  Review of Systems: All systems reviewed and negative except where noted in HPI.  Physical Exam: Vital signs in last 24 hours: Temp:  [98 F (36.7 C)-99.2 F (37.3 C)] 98 F (36.7 C) (09/06 0750) Pulse Rate:  [94-100] 98 (09/06 0750) Resp:  [16-19] 17 (09/06 0750) BP: (112-134)/(45-54) 112/52 (09/06 0750) SpO2:  [89 %-99 %] 89 % (09/06 0750) Last BM Date : 03/07/24  General:  Pleasant female in NAD Psych:  Cooperative. Normal mood and affect Eyes: Pupils equal Ears:  Normal auditory acuity Nose: No deformity, discharge or lesions Neck:  Supple, no masses felt Lungs:  Clear to auscultation.  Heart:  Regular rate, regular rhythm.  Abdomen:  Soft, nondistended, nontender, active bowel sounds, no masses felt Rectal :  Deferred Msk: Symmetrical without gross deformities.  Neurologic:  Alert, oriented, grossly normal neurologically Extremities : No edema   Intake/Output from previous day: No intake/output data recorded. Intake/Output this shift:  No intake/output data recorded.   Vina Dasen, NP-C   03/10/2024, 8:50 AM

## 2024-03-10 NOTE — Plan of Care (Signed)
   Problem: Education: Goal: Knowledge of General Education information will improve Description Including pain rating scale, medication(s)/side effects and non-pharmacologic comfort measures Outcome: Progressing

## 2024-03-10 NOTE — Anesthesia Postprocedure Evaluation (Deleted)
 Anesthesia Post Note  Patient: Paula Hamilton  Procedure(s) Performed: EGD (ESOPHAGOGASTRODUODENOSCOPY) CONTROL OF HEMORRHAGE, GI TRACT, ENDOSCOPIC, BY CLIPPING OR OVERSEWING     Patient location during evaluation: PACU Anesthesia Type: MAC Level of consciousness: responds to stimulation and sedated Pain management: pain level controlled Vital Signs Assessment: post-procedure vital signs reviewed and stable Respiratory status: spontaneous breathing and nonlabored ventilation Anesthetic complications: no   No notable events documented.  Last Vitals:  Vitals:   03/10/24 1404 03/10/24 1505  BP: (!) 143/56 (!) 96/36  Pulse: 99 82  Resp: 17 (!) 21  Temp: 37.1 C   SpO2: 96% 93%    Last Pain:  Vitals:   03/10/24 1404  TempSrc: Temporal  PainSc: 2                  Yalonda Sample

## 2024-03-10 NOTE — Progress Notes (Addendum)
 [facility] night cross cover note:   I was contacted by the patient's RN for clarification regarding potential maintenance ivf's.  This is a 73 year old female who is admitted on 03/09/2024 for concern regarding acute upper gastrointestinal bleed. She has not required prbc transfusion since arriving at [facility] and remains npo at this time. VS appear stable, with SBP's consistently in the 120's mmHg and HR's in the 90's. Updated CBC ordered by admitting hospitalist, with result currently pending.   I subsequently ordered LR at 75 cc/hr x 10 hours.   Morning labs in the form of cbc, bmp added.     Update:  Patient's RN conveys that this AM's labs include finding of Hgb of 7.2. this is relative to hgb of 14.8 on 02/23/24. It also appears that pt's Hgb in [facility] ED was approximately 10 yesterday. The patient has been on continuous LR over the last few hours, as above. No report of any episodes of hematemesis overnight.   I subsequently placed orders for type/screen, transfusion of 1 unit prbc over 3 hours, as well as repeat H&H to be checked following completion of transfusion of this one unit prbc.     Eva Pore, DO Hospitalist

## 2024-03-10 NOTE — Progress Notes (Signed)
 VAST consult received to obtain IV access for blood transfusion and for EGD later today. Bilateral arms assessed utilizing ultrasound; bilateral forearms with extremely small vessels, too small for 24G PIV, upper left arm cephalic too small and brachial and basilic vessels small and too deep. Able to place a 20G 10 cm midline in right upper cephalic vein. If further IV access is needed, a central line or PICC is recommended.

## 2024-03-10 NOTE — Op Note (Signed)
 [facility] Patient Name: Paula Hamilton Procedure Date : 03/10/2024 MRN: [id number] Attending MD: Inocente Hausen , MD, [id number] Date of Birth: 1950/10/17 CSN: [id number] Age: 73 Admit Type: Inpatient Procedure:                Upper GI endoscopy Indications:              Abdominal distress, Acute post hemorrhagic anemia,                            Hematemesis Providers:                Inocente Hausen, MD, Collene Edu, RN, Joya Bunnell,                            Technician Referring MD:             Toribio CHARLENA Sprang Medicines:                Monitored Anesthesia Care Complications:            No immediate complications. Estimated blood loss:                            Minimal. Estimated Blood Loss:     Estimated blood loss was minimal. Procedure:                Pre-Anesthesia Assessment:                           - Prior to the procedure, a History and Physical                            was performed, and patient medications and                            allergies were reviewed. The patient's tolerance of                            previous anesthesia was also reviewed. The risks                            and benefits of the procedure and the sedation                            options and risks were discussed with the patient.                            All questions were answered, and informed consent                            was obtained. Prior Anticoagulants: The patient has                            taken Plavix  (clopidogrel ), last dose was 2 days                            prior  to procedure. ASA Grade Assessment: III - A                            patient with severe systemic disease. After                            reviewing the risks and benefits, the patient was                            deemed in satisfactory condition to undergo the                            procedure.                           After obtaining informed consent, the endoscope was                             passed under direct vision. Throughout the                            procedure, the patient's blood pressure, pulse, and                            oxygen saturations were monitored continuously. The                            [id number]-H190 ([id number]) Olympus endoscope was introduced                            through the mouth, and advanced to the second part                            of duodenum. The upper GI endoscopy was                            accomplished without difficulty. The patient                            tolerated the procedure well. Scope In: Scope Out: Findings:      The examined esophagus was normal.      The gastric body, gastric antrum, cardia (on retroflexion) and gastric       fundus (on retroflexion) were normal.      One non-bleeding cratered gastric ulcer with a clean ulcer base (Forrest       Class III) was found at the pylorus. The lesion was 5 mm in largest       dimension. To prevent recurrent bleeding, two hemostatic clips were       successfully placed. 2 mL of Purastat applied around ulcer site and       clips. There was no bleeding at the end of the procedure. No other       ulcers were seen in the pylorus, prepyloric region or duodenal bulb.      The duodenal bulb and second portion of the duodenum were normal. Impression:               -  Normal esophagus.                           - Normal gastric body, antrum, cardia and gastric                            fundus.                           - Non-bleeding gastric ulcer with a clean ulcer                            base (Forrest Class III). Clips were placed.                            Puratstat applied. This is a likely source of                            hematemesis and anemia.                           - Normal duodenal bulb and second portion of the                            duodenum.                           - No specimens collected. Biopsies were not                             performed in the stomach for H. pylori given recent                            bleeding, significant anemia and need to resume                            antiplatelet therapy in the near future. Recommendation:           - Return patient to hospital ward for ongoing care.                           - Continue Protonix  40 mg IV twice daily                           - Continue holding Plavix  and aspirin  today.                            Pending clinical stability will make a                            determination if antiplatelet therapy can be                            resumed tomorrow.                           -  Continue to monitor serial hemoglobin and                            hematocrit                           - Patient may trial a clear liquid diet today                           - Diatherix H. pylori stool antigen can be                            performed in the outpatient setting when the                            patient is clinically stable for H. pylori testing. Procedure Code(s):        --- Professional ---                           [phone], Esophagogastroduodenoscopy, flexible,                            transoral; with control of bleeding, any method Diagnosis Code(s):        --- Professional ---                           K25.9, Gastric ulcer, unspecified as acute or                            chronic, without hemorrhage or perforation                           R10.9, Unspecified abdominal pain                           D62, Acute posthemorrhagic anemia                           K92.0, Hematemesis CPT copyright 2022 American [employer] Association. All rights reserved. The codes documented in this report are preliminary and upon coder review may  be revised to meet current compliance requirements. Inocente Hausen, MD 03/10/2024 3:05:52 PM This report has been signed electronically. Number of Addenda: 0

## 2024-03-11 DIAGNOSIS — R1084 Generalized abdominal pain: Secondary | ICD-10-CM | POA: Diagnosis not present

## 2024-03-11 DIAGNOSIS — K264 Chronic or unspecified duodenal ulcer with hemorrhage: Secondary | ICD-10-CM | POA: Diagnosis not present

## 2024-03-11 DIAGNOSIS — R110 Nausea: Secondary | ICD-10-CM

## 2024-03-11 DIAGNOSIS — K259 Gastric ulcer, unspecified as acute or chronic, without hemorrhage or perforation: Secondary | ICD-10-CM | POA: Diagnosis not present

## 2024-03-11 LAB — GLUCOSE, CAPILLARY
Glucose-Capillary: 106 mg/dL — ABNORMAL HIGH (ref 70–99)
Glucose-Capillary: 131 mg/dL — ABNORMAL HIGH (ref 70–99)
Glucose-Capillary: 181 mg/dL — ABNORMAL HIGH (ref 70–99)
Glucose-Capillary: 188 mg/dL — ABNORMAL HIGH (ref 70–99)
Glucose-Capillary: 262 mg/dL — ABNORMAL HIGH (ref 70–99)
Glucose-Capillary: 281 mg/dL — ABNORMAL HIGH (ref 70–99)

## 2024-03-11 LAB — BASIC METABOLIC PANEL WITH GFR
Anion gap: 9 (ref 5–15)
BUN: 37 mg/dL — ABNORMAL HIGH (ref 8–23)
CO2: 20 mmol/L — ABNORMAL LOW (ref 22–32)
Calcium: 9.1 mg/dL (ref 8.9–10.3)
Chloride: 111 mmol/L (ref 98–111)
Creatinine, Ser: 1.26 mg/dL — ABNORMAL HIGH (ref 0.44–1.00)
GFR, Estimated: 46 mL/min — ABNORMAL LOW (ref >60)
Glucose, Bld: 128 mg/dL — ABNORMAL HIGH (ref 70–99)
Potassium: 4.3 mmol/L (ref 3.5–5.1)
Sodium: 140 mmol/L (ref 135–145)

## 2024-03-11 LAB — CBC WITH DIFFERENTIAL/PLATELET
Abs Immature Granulocytes: 0.04 K/uL (ref 0.00–0.07)
Basophils Absolute: 0 K/uL (ref 0.0–0.1)
Basophils Relative: 0 %
Eosinophils Absolute: 0 K/uL (ref 0.0–0.5)
Eosinophils Relative: 0 %
HCT: 25.1 % — ABNORMAL LOW (ref 36.0–46.0)
Hemoglobin: 8.4 g/dL — ABNORMAL LOW (ref 12.0–15.0)
Immature Granulocytes: 0 %
Lymphocytes Relative: 33 %
Lymphs Abs: 3.1 K/uL (ref 0.7–4.0)
MCH: 30.7 pg (ref 26.0–34.0)
MCHC: 33.5 g/dL (ref 30.0–36.0)
MCV: 91.6 fL (ref 80.0–100.0)
Monocytes Absolute: 0.7 K/uL (ref 0.1–1.0)
Monocytes Relative: 8 %
Neutro Abs: 5.3 K/uL (ref 1.7–7.7)
Neutrophils Relative %: 59 %
Platelets: 219 K/uL (ref 150–400)
RBC: 2.74 MIL/uL — ABNORMAL LOW (ref 3.87–5.11)
RDW: 17.1 % — ABNORMAL HIGH (ref 11.5–15.5)
WBC: 9.2 K/uL (ref 4.0–10.5)
nRBC: 0 % (ref 0.0–0.2)

## 2024-03-11 LAB — TYPE AND SCREEN
ABO/RH(D): O POS
Antibody Screen: NEGATIVE
Unit division: 0

## 2024-03-11 LAB — BPAM RBC
Blood Product Expiration Date: 202509272359
ISSUE DATE / TIME: 202509061541
Unit Type and Rh: 5100

## 2024-03-11 NOTE — Anesthesia Postprocedure Evaluation (Signed)
 Anesthesia Post Note  Patient: Paula Hamilton  Procedure(s) Performed: EGD (ESOPHAGOGASTRODUODENOSCOPY) CONTROL OF HEMORRHAGE, GI TRACT, ENDOSCOPIC, BY CLIPPING OR OVERSEWING     Patient location during evaluation: PACU Anesthesia Type: General Level of consciousness: awake and alert Pain management: pain level controlled Vital Signs Assessment: post-procedure vital signs reviewed and stable Respiratory status: spontaneous breathing, nonlabored ventilation and respiratory function stable Cardiovascular status: stable and blood pressure returned to baseline Anesthetic complications: no   No notable events documented.  Last Vitals:  Vitals:   03/10/24 2027 03/11/24 0002  BP: (!) 133/57 (!) 115/50  Pulse: 87 77  Resp: 19 20  Temp: 36.8 C 36.6 C  SpO2: 99% 99%    Last Pain:  Vitals:   03/10/24 2235  TempSrc:   PainSc: Asleep                 Debby FORBES Like

## 2024-03-11 NOTE — Progress Notes (Addendum)
    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   EGD 03/10/2024 - One nonbleeding pyloric channel ulcer -2 clips placed and pierced at applied to prevent future bleeding.  No active bleeding seen at the time of EGD.   Interval History   -- Reports nausea and generalized abdominal discomfort today -- No further vomiting or hematemesis -- Hgb 7.2 --> 9.1 (post-transfusion) --> 8.4    Objective   Vital signs in last 24 hours: Temp:  [97.8 F (36.6 C)-99.6 F (37.6 C)] 97.8 F (36.6 C) (09/07 1204) Pulse Rate:  [72-94] 72 (09/07 1204) Resp:  [14-21] 18 (09/07 1204) BP: (96-140)/(29-58) 123/51 (09/07 1204) SpO2:  [90 %-100 %] 99 % (09/07 1204) Last BM Date : 03/07/24 General:    Resting in bed, no distress Heart:  Regular rate and rhythm; no murmurs Lungs: Respirations even and unlabored, lungs CTA bilaterally Abdomen:  Soft, mildly tender to palpation in the upper abdomen and nondistended. Normal bowel sounds. Extremities:  Without edema. Neurologic:  Alert and oriented,  grossly normal neurologically. Psych:  Cooperative. Normal mood and affect.  Intake/Output from previous day: 09/06 0701 - 09/07 0700 In: 890 [P.O.:480; I.V.:400; IV Piggyback:10] Out: 300 [Urine:300] Intake/Output this shift: No intake/output data recorded.  Lab Results: Recent Labs    03/10/24 0239 03/10/24 2019 03/11/24 0951  WBC 12.1*  --  9.2  HGB 7.2* 9.1* 8.4*  HCT 21.7* 27.4* 25.1*  PLT 230  --  219   BMET Recent Labs    03/10/24 0239 03/11/24 0813  NA 144 140  K 4.0 4.3  CL 120* 111  CO2 19* 20*  GLUCOSE 101* 128*  BUN 63* 37*  CREATININE 1.38* 1.26*  CALCIUM 8.5* 9.1   LFT No results for input(s): PROT, ALBUMIN , AST, ALT, ALKPHOS, BILITOT, BILIDIR, IBILI in  the last 72 hours. PT/INR Recent Labs    03/09/24 1759  LABPROT 15.3*  INR 1.1    Studies/Results: No results found.  Endoscopic Studies: EGD 03/10/2024 - One nonbleeding pyloric channel ulcer -2 clips placed and pierced at applied to prevent future bleeding.   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   EGD 03/10/2024 - One nonbleeding pyloric channel ulcer -2 clips placed and pierced at applied to prevent future bleeding.  No active bleeding seen at the time of EGD.  Today reports some nausea and generalized abdominal discomfort but has not had further hematemesis.  Denies bowel movements.  Has been consuming clear liquid diet.  Hemoglobin has fluctuated 7.1 --> 9.1 --> 8.4 but has not demonstrated overt GI bleeding.   Plan  Continue to monitor trend in hemoglobin and hematocrit Continue IV Protonix  twice daily Continue holding aspirin  and Plavix  for now until clear trend in hemoglobin is established. Antiemetics as needed Continue clear liquid diet. NSAID avoidance  Dr. Shila and Greig Corti, PA will be assuming rounding responsibilities for [facility] 03/12/2024  Paula Hamilton  03/11/2024, 2:27 PM  Paula Hausen, MD [facility]

## 2024-03-11 NOTE — Progress Notes (Signed)
 PROGRESS NOTE    SEBRENA ENGH  FMW:[id number] DOB: [birth year] DOA: 03/09/2024 PCP: [name], MD   Brief Narrative:    Assessment & Plan:   Principal Problem:   GI bleeding Active Problems:   Hematemesis with nausea   ABLA (acute blood loss anemia)   Gastric ulcer with hemorrhage   73 year old female with history of recent MI on dual antiplatelet therapy since 12/20/2023, hypertension, chronic back pain, diabetes mellitus is admitted for evaluation of dizziness/episode of hematemesis and acute blood loss anemia.   Upper GI bleeding - on dual antiplatelet therapy for recent MI 12/20/2023. - EGD per GI 9/6: Nonbleeding cratered gastric ulcer noted which was clipped.  Likely source of bleeding. - Will continue to hold aspirin  and Plavix .  Resume when okay from GI.  If dual antiplatelet therapy cannot be resumed yet, start at least aspirin . -Currently clear liquid diet  acute Blood loss anemia: Baseline hemoglobin around 14.  Dropped as low as 7.1 1 unit PRBC transfusion on 9/6, hemoglobin improved to 8.4.  AKI: Creatinine 1.26 with baseline around 0.9. Will monitor daily Continue to hold lisinopril , hydrochlorothiazide   Dizziness: Likely secondary to above. Patient does report of hypotension recently in addition to her antihypertensives by PCP/cardiology. Will continue to hold lisinopril , metoprolol , hydrochlorothiazide  which she has not been taking recently.  Right rib cage pain: Secondary to fall, continue as needed analgesics   History of MI s/p stent 12/20/2023: Hold dual antiplatelet therapy Minimize interruption as much as possible   Diabetes mellitus: insulin  sliding scale insulin  coverage   Full CODE STATUS      Advance Care Planning:   Code Status: Full Code    Consults: GI   Family Communication: No family at the bedside   Severity of Illness: The appropriate patient status for this patient is INPATIENT. Inpatient status is judged to be  reasonable and necessary in order to provide the required intensity of service to ensure the patient's safety. The patient's presenting symptoms, physical exam findings, and initial radiographic and laboratory data in the context of their chronic comorbidities is felt to place them at high risk for further clinical deterioration. Furthermore, it is not anticipated that the patient will be medically stable for discharge from the hospital within 2 midnights of admission.    * I certify that at the point of admission it is my clinical judgment that the patient will require inpatient hospital care spanning beyond 2 midnights from the point of admission due to high intensity of service, high risk for further deterioration and high frequency of surveillance required.*      Subjective:  Patient seen and examined at the bedside earlier today.  She continued to complain of some dizziness and also nausea today.  Denied any vomiting.  Denied any abdominal pain fever or chills.  Objective: Vitals:   03/10/24 2027 03/11/24 0002 03/11/24 0329 03/11/24 0755  BP: (!) 133/57 (!) 115/50 (!) 121/46 (!) 125/58  Pulse: 87 77 72 73  Resp: 19 20 20 17   Temp: 98.3 F (36.8 C) 97.8 F (36.6 C) 97.9 F (36.6 C) 97.8 F (36.6 C)  TempSrc:      SpO2: 99% 99% 100% 100%    Intake/Output Summary (Last 24 hours) at 03/11/2024 1058 Last data filed at 03/11/2024 0329 Gross per 24 hour  Intake 890 ml  Output 300 ml  Net 590 ml   There were no vitals filed for this visit.  Examination:  General exam: Appears calm and  comfortable  Respiratory system: Bilateral decreased breath sounds at bases Cardiovascular system: S1 & S2 heard, Rate controlled Gastrointestinal system: Abdomen is nondistended, soft and nontender. Normal bowel sounds heard. Extremities: No cyanosis, clubbing, edema  Central nervous system: Alert and oriented. No focal neurological deficits. Moving extremities Skin: No rashes, lesions or  ulcers Psychiatry: Judgement and insight appear normal. Mood & affect appropriate.     Data Reviewed: I have personally reviewed following labs and imaging studies  CBC: Recent Labs  Lab 03/10/24 0239 03/10/24 2019 03/11/24 0951  WBC 12.1*  --  9.2  NEUTROABS  --   --  5.3  HGB 7.2* 9.1* 8.4*  HCT 21.7* 27.4* 25.1*  MCV 96.4  --  91.6  PLT 230  --  219   Basic Metabolic Panel: Recent Labs  Lab 03/10/24 0239 03/11/24 0813  NA 144 140  K 4.0 4.3  CL 120* 111  CO2 19* 20*  GLUCOSE 101* 128*  BUN 63* 37*  CREATININE 1.38* 1.26*  CALCIUM 8.5* 9.1   GFR: CrCl cannot be calculated (Unknown ideal weight.). Liver Function Tests: No results for input(s): AST, ALT, ALKPHOS, BILITOT, PROT, ALBUMIN  in the last 168 hours. No results for input(s): LIPASE, AMYLASE in the last 168 hours. No results for input(s): AMMONIA in the last 168 hours. Coagulation Profile: Recent Labs  Lab 03/09/24 1759  INR 1.1   Cardiac Enzymes: No results for input(s): CKTOTAL, CKMB, CKMBINDEX, TROPONINI in the last 168 hours. BNP (last 3 results) No results for input(s): PROBNP in the last 8760 hours. HbA1C: No results for input(s): HGBA1C in the last 72 hours. CBG: Recent Labs  Lab 03/10/24 1605 03/10/24 2027 03/11/24 0011 03/11/24 0332 03/11/24 0754  GLUCAP 126* 331* 281* 181* 131*   Lipid Profile: No results for input(s): CHOL, HDL, LDLCALC, TRIG, CHOLHDL, LDLDIRECT in the last 72 hours. Thyroid  Function Tests: No results for input(s): TSH, T4TOTAL, FREET4, T3FREE, THYROIDAB in the last 72 hours. Anemia Panel: No results for input(s): VITAMINB12, FOLATE, FERRITIN, TIBC, IRON, RETICCTPCT in the last 72 hours. Sepsis Labs: No results for input(s): PROCALCITON, LATICACIDVEN in the last 168 hours.  No results found for this or any previous visit (from the past 240 hours).       Radiology Studies: No results  found.      Scheduled Meds:  sodium chloride    Intravenous Once   insulin  aspart  0-9 Units Subcutaneous Q4H   levETIRAcetam   500 mg Intravenous Q12H   nicotine   7 mg Transdermal Daily   pantoprazole  (PROTONIX ) IV  40 mg Intravenous Q12H   sodium chloride  flush  10-40 mL Intracatheter Q12H   Continuous Infusions:        Arelia Volpe, MD [facility] 03/11/2024, 10:58 AM

## 2024-03-12 ENCOUNTER — Other Ambulatory Visit: Payer: Self-pay

## 2024-03-12 ENCOUNTER — Encounter (HOSPITAL_COMMUNITY): Payer: Self-pay | Admitting: Pediatrics

## 2024-03-12 DIAGNOSIS — D62 Acute posthemorrhagic anemia: Secondary | ICD-10-CM | POA: Diagnosis not present

## 2024-03-12 DIAGNOSIS — K264 Chronic or unspecified duodenal ulcer with hemorrhage: Secondary | ICD-10-CM | POA: Diagnosis not present

## 2024-03-12 DIAGNOSIS — Z7902 Long term (current) use of antithrombotics/antiplatelets: Secondary | ICD-10-CM

## 2024-03-12 DIAGNOSIS — K920 Hematemesis: Secondary | ICD-10-CM | POA: Diagnosis not present

## 2024-03-12 LAB — GLUCOSE, CAPILLARY
Glucose-Capillary: 138 mg/dL — ABNORMAL HIGH (ref 70–99)
Glucose-Capillary: 158 mg/dL — ABNORMAL HIGH (ref 70–99)
Glucose-Capillary: 196 mg/dL — ABNORMAL HIGH (ref 70–99)
Glucose-Capillary: 222 mg/dL — ABNORMAL HIGH (ref 70–99)
Glucose-Capillary: 265 mg/dL — ABNORMAL HIGH (ref 70–99)
Glucose-Capillary: 92 mg/dL (ref 70–99)

## 2024-03-12 LAB — HEMOGLOBIN AND HEMATOCRIT, BLOOD
HCT: 26.1 % — ABNORMAL LOW (ref 36.0–46.0)
Hemoglobin: 8.8 g/dL — ABNORMAL LOW (ref 12.0–15.0)

## 2024-03-12 MED ORDER — INSULIN ASPART 100 UNIT/ML IJ SOLN
0.0000 [IU] | Freq: Three times a day (TID) | INTRAMUSCULAR | Status: DC
  Administered 2024-03-12: 2 [IU] via SUBCUTANEOUS
  Administered 2024-03-12 – 2024-03-13 (×2): 5 [IU] via SUBCUTANEOUS
  Administered 2024-03-13 (×3): 2 [IU] via SUBCUTANEOUS
  Administered 2024-03-14: 3 [IU] via SUBCUTANEOUS
  Administered 2024-03-14: 1 [IU] via SUBCUTANEOUS

## 2024-03-12 MED ORDER — LEVETIRACETAM 500 MG PO TABS
500.0000 mg | ORAL_TABLET | Freq: Two times a day (BID) | ORAL | Status: DC
  Administered 2024-03-12 – 2024-03-14 (×4): 500 mg via ORAL
  Filled 2024-03-12 (×4): qty 1

## 2024-03-12 MED ORDER — TRAMADOL HCL 50 MG PO TABS
50.0000 mg | ORAL_TABLET | Freq: Four times a day (QID) | ORAL | Status: DC | PRN
  Administered 2024-03-12 – 2024-03-14 (×6): 50 mg via ORAL
  Filled 2024-03-12 (×6): qty 1

## 2024-03-12 MED ORDER — METOPROLOL SUCCINATE ER 25 MG PO TB24
25.0000 mg | ORAL_TABLET | Freq: Every day | ORAL | Status: DC
  Administered 2024-03-12: 25 mg via ORAL
  Filled 2024-03-12 (×2): qty 1

## 2024-03-12 MED ORDER — EMPAGLIFLOZIN 10 MG PO TABS
10.0000 mg | ORAL_TABLET | Freq: Every day | ORAL | Status: DC
  Administered 2024-03-13 – 2024-03-14 (×2): 10 mg via ORAL
  Filled 2024-03-12 (×2): qty 1

## 2024-03-12 MED ORDER — ACETAMINOPHEN 325 MG PO TABS
650.0000 mg | ORAL_TABLET | Freq: Four times a day (QID) | ORAL | Status: DC
  Administered 2024-03-12 – 2024-03-14 (×10): 650 mg via ORAL
  Filled 2024-03-12 (×9): qty 2

## 2024-03-12 MED ORDER — SUCRALFATE 1 GM/10ML PO SUSP
1.0000 g | Freq: Three times a day (TID) | ORAL | Status: DC
Start: 2024-03-12 — End: 2024-03-19
  Administered 2024-03-12 – 2024-03-14 (×9): 1 g via ORAL
  Filled 2024-03-12 (×9): qty 10

## 2024-03-12 MED ORDER — INSULIN GLARGINE 100 UNIT/ML ~~LOC~~ SOLN
7.0000 [IU] | Freq: Every day | SUBCUTANEOUS | Status: DC
  Administered 2024-03-12 – 2024-03-13 (×2): 7 [IU] via SUBCUTANEOUS
  Filled 2024-03-12 (×2): qty 0.07

## 2024-03-12 MED ORDER — PANTOPRAZOLE SODIUM 40 MG PO TBEC
40.0000 mg | DELAYED_RELEASE_TABLET | Freq: Two times a day (BID) | ORAL | Status: DC
  Administered 2024-03-12 – 2024-03-14 (×5): 40 mg via ORAL
  Filled 2024-03-12 (×5): qty 1

## 2024-03-12 MED ORDER — ENSURE PLUS HIGH PROTEIN PO LIQD
237.0000 mL | Freq: Two times a day (BID) | ORAL | Status: DC
  Administered 2024-03-13 – 2024-03-14 (×3): 237 mL via ORAL

## 2024-03-12 MED ORDER — EZETIMIBE 10 MG PO TABS
10.0000 mg | ORAL_TABLET | Freq: Every day | ORAL | Status: DC
  Administered 2024-03-12 – 2024-03-14 (×3): 10 mg via ORAL
  Filled 2024-03-12 (×4): qty 1

## 2024-03-12 NOTE — Plan of Care (Signed)
  Problem: Education: Goal: Knowledge of General Education information will improve Description: Including pain rating scale, medication(s)/side effects and non-pharmacologic comfort measures Outcome: Progressing   Problem: Health Behavior/Discharge Planning: Goal: Ability to manage health-related needs will improve Outcome: Progressing   Problem: Clinical Measurements: Goal: Ability to maintain clinical measurements within normal limits will improve Outcome: Progressing Goal: Will remain free from infection Outcome: Progressing Goal: Diagnostic test results will improve Outcome: Progressing Goal: Respiratory complications will improve Outcome: Progressing Goal: Cardiovascular complication will be avoided Outcome: Progressing   Problem: Activity: Goal: Risk for activity intolerance will decrease Outcome: Progressing   Problem: Nutrition: Goal: Adequate nutrition will be maintained Outcome: Progressing   Problem: Coping: Goal: Level of anxiety will decrease Outcome: Progressing   Problem: Elimination: Goal: Will not experience complications related to bowel motility Outcome: Progressing Goal: Will not experience complications related to urinary retention Outcome: Progressing   Problem: Pain Managment: Goal: General experience of comfort will improve and/or be controlled Outcome: Progressing   Problem: Safety: Goal: Ability to remain free from injury will improve Outcome: Progressing   Problem: Skin Integrity: Goal: Risk for impaired skin integrity will decrease Outcome: Progressing   Problem: Education: Goal: Ability to describe self-care measures that may prevent or decrease complications (Diabetes Survival Skills Education) will improve Outcome: Progressing Goal: Individualized Educational Video(s) Outcome: Progressing   Problem: Coping: Goal: Ability to adjust to condition or change in health will improve Outcome: Progressing   Problem: Fluid  Volume: Goal: Ability to maintain a balanced intake and output will improve Outcome: Progressing   Problem: Health Behavior/Discharge Planning: Goal: Ability to identify and utilize available resources and services will improve Outcome: Progressing Goal: Ability to manage health-related needs will improve Outcome: Progressing   Problem: Metabolic: Goal: Ability to maintain appropriate glucose levels will improve Outcome: Progressing   Problem: Nutritional: Goal: Maintenance of adequate nutrition will improve Outcome: Progressing Goal: Progress toward achieving an optimal weight will improve Outcome: Progressing   Problem: Skin Integrity: Goal: Risk for impaired skin integrity will decrease Outcome: Progressing   Problem: Tissue Perfusion: Goal: Adequacy of tissue perfusion will improve Outcome: Progressing   Problem: Education: Goal: Ability to identify signs and symptoms of gastrointestinal bleeding will improve Outcome: Progressing   Problem: Bowel/Gastric: Goal: Will show no signs and symptoms of gastrointestinal bleeding Outcome: Progressing   Problem: Fluid Volume: Goal: Will show no signs and symptoms of excessive bleeding Outcome: Progressing   Problem: Clinical Measurements: Goal: Complications related to the disease process, condition or treatment will be avoided or minimized Outcome: Progressing
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
## 2024-03-12 NOTE — Progress Notes (Signed)
 PROGRESS NOTE    Paula Hamilton  FMW:[id number] DOB: 1951/04/10 DOA: 03/09/2024 PCP: [name], MD   Brief Narrative:    Assessment & Plan:   Principal Problem:   GI bleeding Active Problems:   Hematemesis with nausea   ABLA (acute blood loss anemia)   Gastric ulcer with hemorrhage   73 year old female with history of recent MI on dual antiplatelet therapy since 12/20/2023, hypertension, chronic back pain, diabetes mellitus is admitted for evaluation of dizziness/episode of hematemesis and acute blood loss anemia.   Upper GI bleeding - on dual antiplatelet therapy for recent MI 12/20/2023. - EGD per GI 9/6: Nonbleeding cratered gastric ulcer noted which was clipped.  Likely source of bleeding. - Will continue to hold aspirin  and Plavix .   - Advance diet as tolerated.  GI recommends holding aspirin  and Plavix  today and resuming in the a.m.  They have signed off.  acute Blood loss anemia: Baseline hemoglobin around 14.  Dropped as low as 7.1 1 unit PRBC transfusion on 9/6, hemoglobin improved to 8.4. She is stable.  AKI: Creatinine 1.26 with baseline around 0.9. Will monitor daily Continue to hold lisinopril , hydrochlorothiazide   Dizziness: Likely secondary to above. Patient does report of hypotension recently in addition to her antihypertensives by PCP/cardiology. Will continue to hold lisinopril , metoprolol , hydrochlorothiazide  which she has not been taking recently. Will have physical therapy evaluation.  Right rib cage pain: Secondary to fall, continue as needed analgesics.  As needed tramadol , scheduled Tylenol  added.   History of MI s/p stent 12/20/2023: Hold dual antiplatelet therapy Minimize interruption as much as possible   Diabetes mellitus: insulin  sliding scale insulin  coverage Will add Lantus  7 unit daily.  Patient takes 40 units of Lantus  daily.  Will slowly escalate the dose given poor intake.   Full CODE STATUS      Advance Care  Planning:   Code Status: Full Code    Consults: GI   Family Communication: No family at the bedside   Severity of Illness: The appropriate patient status for this patient is INPATIENT. Inpatient status is judged to be reasonable and necessary in order to provide the required intensity of service to ensure the patient's safety. The patient's presenting symptoms, physical exam findings, and initial radiographic and laboratory data in the context of their chronic comorbidities is felt to place them at high risk for further clinical deterioration. Furthermore, it is not anticipated that the patient will be medically stable for discharge from the hospital within 2 midnights of admission.    * I certify that at the point of admission it is my clinical judgment that the patient will require inpatient hospital care spanning beyond 2 midnights from the point of admission due to high intensity of service, high risk for further deterioration and high frequency of surveillance required.*      Subjective:  Patient seen and examined at the bedside earlier today.  No acute issues overnight.  She complains of back pain, upper abdominal discomfort but no tenderness episodes.  Denies nausea.  No fever or chills.  Denies any bowel movements for the past several days.  Feels somewhat dizzy but has not gotten out of bed. Objective: Vitals:   03/12/24 0019 03/12/24 0400 03/12/24 0946 03/12/24 1213  BP: (!) 125/47 (!) 138/48 (!) 133/49 (!) 124/43  Pulse: 75 85 93 92  Resp: 19 20    Temp: 98 F (36.7 C) 99.3 F (37.4 C)    TempSrc: Oral  SpO2: 96% 96%  93%    Intake/Output Summary (Last 24 hours) at 03/12/2024 1335 Last data filed at 03/12/2024 0425 Gross per 24 hour  Intake 340 ml  Output 700 ml  Net -360 ml   There were no vitals filed for this visit.  Examination:  General exam: Appears calm and comfortable  Respiratory system: Bilateral decreased breath sounds at bases Cardiovascular system:  S1 & S2 heard, Rate controlled Gastrointestinal system: Abdomen is nondistended, soft and nontender. Normal bowel sounds heard. Extremities: No cyanosis, clubbing, edema  Central nervous system: Alert and oriented. No focal neurological deficits. Moving extremities Skin: No rashes, lesions or ulcers Psychiatry: Judgement and insight appear normal. Mood & affect appropriate.     Data Reviewed: I have personally reviewed following labs and imaging studies  CBC: Recent Labs  Lab 03/10/24 0239 03/10/24 2019 03/11/24 0951 03/12/24 0910  WBC 12.1*  --  9.2  --   NEUTROABS  --   --  5.3  --   HGB 7.2* 9.1* 8.4* 8.8*  HCT 21.7* 27.4* 25.1* 26.1*  MCV 96.4  --  91.6  --   PLT 230  --  219  --    Basic Metabolic Panel: Recent Labs  Lab 03/10/24 0239 03/11/24 0813  NA 144 140  K 4.0 4.3  CL 120* 111  CO2 19* 20*  GLUCOSE 101* 128*  BUN 63* 37*  CREATININE 1.38* 1.26*  CALCIUM 8.5* 9.1   GFR: CrCl cannot be calculated (Unknown ideal weight.). Liver Function Tests: No results for input(s): AST, ALT, ALKPHOS, BILITOT, PROT, ALBUMIN  in the last 168 hours. No results for input(s): LIPASE, AMYLASE in the last 168 hours. No results for input(s): AMMONIA in the last 168 hours. Coagulation Profile: Recent Labs  Lab 03/09/24 1759  INR 1.1   Cardiac Enzymes: No results for input(s): CKTOTAL, CKMB, CKMBINDEX, TROPONINI in the last 168 hours. BNP (last 3 results) No results for input(s): PROBNP in the last 8760 hours. HbA1C: No results for input(s): HGBA1C in the last 72 hours. CBG: Recent Labs  Lab 03/11/24 2004 03/12/24 0018 03/12/24 0401 03/12/24 0759 03/12/24 1212  GLUCAP 262* 222* 92 138* 196*   Lipid Profile: No results for input(s): CHOL, HDL, LDLCALC, TRIG, CHOLHDL, LDLDIRECT in the last 72 hours. Thyroid  Function Tests: No results for input(s): TSH, T4TOTAL, FREET4, T3FREE, THYROIDAB in the last 72  hours. Anemia Panel: No results for input(s): VITAMINB12, FOLATE, FERRITIN, TIBC, IRON, RETICCTPCT in the last 72 hours. Sepsis Labs: No results for input(s): PROCALCITON, LATICACIDVEN in the last 168 hours.  No results found for this or any previous visit (from the past 240 hours).       Radiology Studies: No results found.      Scheduled Meds:  sodium chloride    Intravenous Once   acetaminophen   650 mg Oral Q6H   [START ON 03/13/2024] empagliflozin   10 mg Oral Daily   ezetimibe   10 mg Oral Daily   insulin  aspart  0-9 Units Subcutaneous TID AC & HS   insulin  glargine  7 Units Subcutaneous Daily   levETIRAcetam   500 mg Oral BID   metoprolol  succinate  25 mg Oral Daily   nicotine   7 mg Transdermal Daily   pantoprazole   40 mg Oral BID AC   sodium chloride  flush  10-40 mL Intracatheter Q12H   Continuous Infusions:        Meshell Abdulaziz, MD [facility] 03/12/2024, 1:35 PM

## 2024-03-12 NOTE — Inpatient Diabetes Management (Signed)
  Inpatient Diabetes Program Recommendations  AACE/ADA: New Consensus Statement on Inpatient Glycemic Control   Target Ranges:  Prepandial:   less than 140 mg/dL      Peak postprandial:   less than 180 mg/dL (1-2 hours)      Critically ill patients:  140 - 180 mg/dL    Latest Reference Range & Units 02/16/24 02:08  Hemoglobin A1C 4.8 - 5.6 % 8.0 (H)    Latest Reference Range & Units 03/11/24 07:54 03/11/24 12:03 03/11/24 17:51 03/11/24 20:04 03/12/24 00:18 03/12/24 04:01 03/12/24 07:59  Glucose-Capillary 70 - 99 mg/dL 868 (H) 893 (H) 811 (H) 262 (H) 222 (H) 92 138 (H)   Review of Glycemic Control  Diabetes history: DM2 Outpatient Diabetes medications: Lantus  40 units daily  Current orders for Inpatient glycemic control: Jardiance  10 mg daily and Novolog  0-9 units Q4HRS.  Was prescribed decadron  - now discontinued.   Inpatient Diabetes Program Recommendations:   Insulin : Please consider modifying CBGs and Novolog  0-9 units to AC/HS and adding Lantus  15 units daily.   Thanks,  Lavanda Search, RN, MSN, [facility]  Inpatient Diabetes Coordinator  Pager [phone] (8a-5p)

## 2024-03-12 NOTE — Progress Notes (Addendum)
 Patient ID: Paula Hamilton, female   DOB: [birth year], 73 y.o.   MRN: [id number]    Progress Note   Subjective   Day # 3 CC; hematemesis in setting of aspirin  and Plavix /recent MI 6/25 with PCI/stents  IV PPI twice daily  EGD 9/6-nonbleeding pyloric channel ulcer treated with hemoclips x 2 and purastat  Last hemoglobin 8.4/hematocrit 25.1-pending this a.m.  Patient complaining of various pains, sore all over after her fall prior to admission.  Says her abdomen is sore but no distinct pain, tolerating clear liquids, she says she has not had a bowel movement for several days   Objective   Vital signs in last 24 hours: Temp:  [97.8 F (36.6 C)-99.3 F (37.4 C)] 99.3 F (37.4 C) (09/08 0400) Pulse Rate:  [72-86] 85 (09/08 0400) Resp:  [18-20] 20 (09/08 0400) BP: (118-138)/(47-56) 138/48 (09/08 0400) SpO2:  [95 %-99 %] 96 % (09/08 0400) Last BM Date : 03/07/24 (per chart) General:   Frail appearing older white female in NAD Heart:  Regular rate and rhythm; no murmurs Lungs: Respirations even and unlabored, lungs CTA bilaterally Abdomen:  Soft, nontender and nondistended. Normal bowel sounds. Extremities:  Without edema. Neurologic:  Alert and oriented,  grossly normal neurologically. Psych:  Cooperative. Normal mood and affect.  Intake/Output from previous day: 09/07 0701 - 09/08 0700 In: 340 [P.O.:340] Out: 700 [Urine:700] Intake/Output this shift: No intake/output data recorded.  Lab Results: Recent Labs    03/10/24 0239 03/10/24 2019 03/11/24 0951  WBC 12.1*  --  9.2  HGB 7.2* 9.1* 8.4*  HCT 21.7* 27.4* 25.1*  PLT 230  --  219   BMET Recent Labs    03/10/24 0239 03/11/24 0813  NA 144 140  K 4.0 4.3  CL 120* 111  CO2 19* 20*  GLUCOSE 101* 128*  BUN 63* 37*  CREATININE 1.38* 1.26*  CALCIUM 8.5* 9.1   LFT No results for input(s): PROT, ALBUMIN , AST, ALT, ALKPHOS, BILITOT, BILIDIR, IBILI in the last 72 hours. PT/INR Recent Labs     03/09/24 1759  LABPROT 15.3*  INR 1.1         Assessment / Plan:    #32 73 year old white female admitted with hematemesis in setting of aspirin  and Plavix . Found to have a nonbleeding pyloric ulcer on EGD 03/10/2024   Patient has not had any further active bleeding-last hemoglobin 8.4 and pending this a.m.  #2 coronary artery disease status post MI 12/2023 with PCI/drug-eluting stent LAD and second OM  #3 antiplatelet therapy-currently on hold  #4 anemia acute secondary to acute GI blood loss #5 diabetes mellitus  Plan; advance diet to soft Okay to convert P PI to oral twice daily-with treatment x 8 weeks, then changed to once daily thereafter Continue to trend hemoglobin follow-up today's hemoglobin  Patient related she had been taking occasional Goody powders, advise no NSAIDs at all moving forward Tylenol  okay and tramadol  which she has at home okay  Okay to resume aspirin  and Plavix  tomorrow  GI will sign off, available if needed       Principal Problem:   GI bleeding Active Problems:   Hematemesis with nausea   ABLA (acute blood loss anemia)   Gastric ulcer with hemorrhage     LOS: 3 days   Amy Esterwood PA-C 03/12/2024, 8:36 AM   Attending physician's note   I personally saw the patient and performed a substantive portion of the medical decision making process for this encounter (including a complete  performance of the key components : MDM, Hx and Exam), in conjunction with the APP.  I agree with the APP's note, impression, and  the management plan for the number and complexity of problems addressed at the encounter for the patient and take responsibility for that plan with its inherent risk of complications, morbidity, or mortality with additional input as follows.      Complains of abdominal discomfort after eating lunch Pyloric channel ulcer s/p hemostatic clip placement and Purestat  On exam abdomen is soft, no distention or tenderness  Hemoglobin is  stable  Advance diet to low residue soft diet Avoid NSAIDs PPI twice daily, can transition to once daily after 2 months Start Carafate  before meals and at bedtime for 1 week  Okay to resume aspirin  and Plavix  tomorrow if hemoglobin continues to remain stable  GI signing off, available if have any questions   The patient was provided an opportunity to ask questions and all were answered. The patient agreed with the plan and demonstrated an understanding of the instructions.   LOIS Wilkie Mcgee , MD [phone]

## 2024-03-12 NOTE — Care Management Important Message (Signed)
 Important Message  Patient Details  Name: Paula Hamilton MRN: [id number] Date of Birth: Mar 17, 1951   Important Message Given:  Yes - Medicare IM     Claretta Deed 03/12/2024, 3:41 PM

## 2024-03-13 ENCOUNTER — Telehealth (HOSPITAL_BASED_OUTPATIENT_CLINIC_OR_DEPARTMENT_OTHER): Payer: Self-pay | Admitting: Family Medicine

## 2024-03-13 LAB — GLUCOSE, CAPILLARY
Glucose-Capillary: 184 mg/dL — ABNORMAL HIGH (ref 70–99)
Glucose-Capillary: 184 mg/dL — ABNORMAL HIGH (ref 70–99)
Glucose-Capillary: 194 mg/dL — ABNORMAL HIGH (ref 70–99)
Glucose-Capillary: 215 mg/dL — ABNORMAL HIGH (ref 70–99)
Glucose-Capillary: 229 mg/dL — ABNORMAL HIGH (ref 70–99)
Glucose-Capillary: 232 mg/dL — ABNORMAL HIGH (ref 70–99)

## 2024-03-13 LAB — HEMOGLOBIN AND HEMATOCRIT, BLOOD
HCT: 23.7 % — ABNORMAL LOW (ref 36.0–46.0)
HCT: 26.9 % — ABNORMAL LOW (ref 36.0–46.0)
Hemoglobin: 7.9 g/dL — ABNORMAL LOW (ref 12.0–15.0)
Hemoglobin: 8.8 g/dL — ABNORMAL LOW (ref 12.0–15.0)

## 2024-03-13 MED ORDER — CLOPIDOGREL BISULFATE 75 MG PO TABS
75.0000 mg | ORAL_TABLET | Freq: Every day | ORAL | Status: DC
  Administered 2024-03-13: 75 mg via ORAL
  Filled 2024-03-13 (×2): qty 1

## 2024-03-13 MED ORDER — OXYCODONE HCL 5 MG PO TABS
5.0000 mg | ORAL_TABLET | Freq: Once | ORAL | Status: AC
  Administered 2024-03-13: 5 mg via ORAL
  Filled 2024-03-13: qty 1

## 2024-03-13 MED ORDER — INSULIN GLARGINE 100 UNIT/ML ~~LOC~~ SOLN
15.0000 [IU] | Freq: Every day | SUBCUTANEOUS | Status: DC
  Administered 2024-03-14: 15 [IU] via SUBCUTANEOUS
  Filled 2024-03-13: qty 0.15

## 2024-03-13 MED ORDER — INSULIN GLARGINE 100 UNIT/ML ~~LOC~~ SOLN
8.0000 [IU] | Freq: Once | SUBCUTANEOUS | Status: AC
  Administered 2024-03-13: 8 [IU] via SUBCUTANEOUS
  Filled 2024-03-13: qty 0.08

## 2024-03-13 MED ORDER — ASPIRIN 81 MG PO TBEC
81.0000 mg | DELAYED_RELEASE_TABLET | Freq: Every day | ORAL | Status: DC
Start: 2024-03-13 — End: 2024-03-14
  Administered 2024-03-13: 81 mg via ORAL
  Filled 2024-03-13 (×2): qty 1

## 2024-03-13 NOTE — Telephone Encounter (Signed)
 Copied from CRM [id number]. Topic: General - Other >> Mar 13, 2024  1:33 PM Deaijah H wrote: Reason for CRM: Patient called in requesting to speak with Dr. Dottie nurse Elease to speak with her with things that are going on. Please call [phone]

## 2024-03-13 NOTE — Plan of Care (Signed)
 Problem: Education: Goal: Knowledge of General Education information will improve Description: Including pain rating scale, medication(s)/side effects and non-pharmacologic comfort measures 03/13/2024 0349 by Taft Sari POUR, RN Outcome: Progressing 03/13/2024 0349 by Taft Sari POUR, RN Outcome: Progressing   Problem: Health Behavior/Discharge Planning: Goal: Ability to manage health-related needs will improve 03/13/2024 0349 by Taft Sari POUR, RN Outcome: Progressing 03/13/2024 0349 by Taft Sari POUR, RN Outcome: Progressing   Problem: Clinical Measurements: Goal: Ability to maintain clinical measurements within normal limits will improve 03/13/2024 0349 by Taft Sari POUR, RN Outcome: Progressing 03/13/2024 0349 by Taft Sari POUR, RN Outcome: Progressing Goal: Will remain free from infection 03/13/2024 0349 by Taft Sari POUR, RN Outcome: Progressing 03/13/2024 0349 by Taft Sari POUR, RN Outcome: Progressing Goal: Diagnostic test results will improve 03/13/2024 0349 by Taft Sari POUR, RN Outcome: Progressing 03/13/2024 0349 by Taft Sari POUR, RN Outcome: Progressing Goal: Respiratory complications will improve 03/13/2024 0349 by Taft Sari POUR, RN Outcome: Progressing 03/13/2024 0349 by Taft Sari POUR, RN Outcome: Progressing Goal: Cardiovascular complication will be avoided 03/13/2024 0349 by Taft Sari POUR, RN Outcome: Progressing 03/13/2024 0349 by Taft Sari POUR, RN Outcome: Progressing   Problem: Activity: Goal: Risk for activity intolerance will decrease 03/13/2024 0349 by Taft Sari POUR, RN Outcome: Progressing 03/13/2024 0349 by Taft Sari POUR, RN Outcome: Progressing   Problem: Nutrition: Goal: Adequate nutrition will be maintained 03/13/2024 0349 by Taft Sari POUR, RN Outcome: Progressing 03/13/2024 0349 by Taft Sari POUR, RN Outcome: Progressing   Problem: Coping: Goal: Level of anxiety will decrease 03/13/2024 0349 by Taft Sari POUR, RN Outcome:  Progressing 03/13/2024 0349 by Taft Sari POUR, RN Outcome: Progressing   Problem: Elimination: Goal: Will not experience complications related to bowel motility 03/13/2024 0349 by Taft Sari POUR, RN Outcome: Progressing 03/13/2024 0349 by Taft Sari POUR, RN Outcome: Progressing Goal: Will not experience complications related to urinary retention 03/13/2024 0349 by Taft Sari POUR, RN Outcome: Progressing 03/13/2024 0349 by Taft Sari POUR, RN Outcome: Progressing   Problem: Pain Managment: Goal: General experience of comfort will improve and/or be controlled 03/13/2024 0349 by Taft Sari POUR, RN Outcome: Progressing 03/13/2024 0349 by Taft Sari POUR, RN Outcome: Progressing   Problem: Safety: Goal: Ability to remain free from injury will improve 03/13/2024 0349 by Taft Sari POUR, RN Outcome: Progressing 03/13/2024 0349 by Taft Sari POUR, RN Outcome: Progressing   Problem: Skin Integrity: Goal: Risk for impaired skin integrity will decrease 03/13/2024 0349 by Taft Sari POUR, RN Outcome: Progressing 03/13/2024 0349 by Taft Sari POUR, RN Outcome: Progressing   Problem: Education: Goal: Ability to describe self-care measures that may prevent or decrease complications (Diabetes Survival Skills Education) will improve 03/13/2024 0349 by Taft Sari POUR, RN Outcome: Progressing 03/13/2024 0349 by Taft Sari POUR, RN Outcome: Progressing Goal: Individualized Educational Video(s) 03/13/2024 0349 by Taft Sari POUR, RN Outcome: Progressing 03/13/2024 0349 by Taft Sari POUR, RN Outcome: Progressing   Problem: Coping: Goal: Ability to adjust to condition or change in health will improve 03/13/2024 0349 by Taft Sari POUR, RN Outcome: Progressing 03/13/2024 0349 by Taft Sari POUR, RN Outcome: Progressing   Problem: Fluid Volume: Goal: Ability to maintain a balanced intake and output will improve 03/13/2024 0349 by Taft Sari POUR, RN Outcome: Progressing 03/13/2024 0349 by  Taft Sari POUR, RN Outcome: Progressing   Problem: Health Behavior/Discharge Planning: Goal: Ability to identify and utilize available resources and services will improve 03/13/2024 0349 by Taft Sari POUR, RN Outcome: Progressing 03/13/2024 0349 by  Taft Sari POUR, RN Outcome: Progressing Goal: Ability to manage health-related needs will improve 03/13/2024 0349 by Taft Sari POUR, RN Outcome: Progressing 03/13/2024 0349 by Taft Sari POUR, RN Outcome: Progressing   Problem: Metabolic: Goal: Ability to maintain appropriate glucose levels will improve 03/13/2024 0349 by Taft Sari POUR, RN Outcome: Progressing 03/13/2024 0349 by Taft Sari POUR, RN Outcome: Progressing   Problem: Nutritional: Goal: Maintenance of adequate nutrition will improve 03/13/2024 0349 by Taft Sari POUR, RN Outcome: Progressing 03/13/2024 0349 by Taft Sari POUR, RN Outcome: Progressing Goal: Progress toward achieving an optimal weight will improve 03/13/2024 0349 by Taft Sari POUR, RN Outcome: Progressing 03/13/2024 0349 by Taft Sari POUR, RN Outcome: Progressing   Problem: Skin Integrity: Goal: Risk for impaired skin integrity will decrease 03/13/2024 0349 by Taft Sari POUR, RN Outcome: Progressing 03/13/2024 0349 by Taft Sari POUR, RN Outcome: Progressing   Problem: Tissue Perfusion: Goal: Adequacy of tissue perfusion will improve 03/13/2024 0349 by Taft Sari POUR, RN Outcome: Progressing 03/13/2024 0349 by Taft Sari POUR, RN Outcome: Progressing   Problem: Education: Goal: Ability to identify signs and symptoms of gastrointestinal bleeding will improve 03/13/2024 0349 by Taft Sari POUR, RN Outcome: Progressing 03/13/2024 0349 by Taft Sari POUR, RN Outcome: Progressing   Problem: Bowel/Gastric: Goal: Will show no signs and symptoms of gastrointestinal bleeding 03/13/2024 0349 by Taft Sari POUR, RN Outcome: Progressing 03/13/2024 0349 by Taft Sari POUR, RN Outcome: Progressing    Problem: Fluid Volume: Goal: Will show no signs and symptoms of excessive bleeding 03/13/2024 0349 by Taft Sari POUR, RN Outcome: Progressing 03/13/2024 0349 by Taft Sari POUR, RN Outcome: Progressing   Problem: Clinical Measurements: Goal: Complications related to the disease process, condition or treatment will be avoided or minimized 03/13/2024 0349 by Taft Sari POUR, RN Outcome: Progressing 03/13/2024 0349 by Taft Sari POUR, RN Outcome: Progressing

## 2024-03-13 NOTE — Progress Notes (Signed)
 PROGRESS NOTE    Paula Hamilton  FMW:[id number] DOB: Feb 20, 1951 DOA: 03/09/2024 PCP: [name], MD   Brief Narrative:    Assessment & Plan:   Principal Problem:   GI bleeding Active Problems:   Hematemesis with nausea   ABLA (acute blood loss anemia)   Gastric ulcer with hemorrhage   73 year old female with history of recent MI on dual antiplatelet therapy since 12/20/2023, hypertension, chronic back pain, diabetes mellitus is admitted for evaluation of dizziness/episode of hematemesis and acute blood loss anemia.   Upper GI bleeding - on dual antiplatelet therapy for recent MI 12/20/2023. - EGD per GI 9/6: Nonbleeding cratered gastric ulcer noted which was clipped.  Likely source of bleeding. - Hemoglobin today is 7.8 from 8.8 yesterday, patient has had no bowel movements for for several days.  Will recheck hemoglobin and hematocrit in the afternoon and if stable will resume aspirin  and Plavix .   acute Blood loss anemia: Baseline hemoglobin around 14.  Dropped as low as 7.1 1 unit PRBC transfusion on 9/6, hemoglobin improved to 8.4.  Fluctuating trends.  AKI: Creatinine 1.26 with baseline around 0.9. Will recheck in the am Continue to hold lisinopril , hydrochlorothiazide   Dizziness: Likely secondary to above. Patient does report of hypotension recently in addition to her antihypertensives by PCP/cardiology. Will continue to hold lisinopril , metoprolol , hydrochlorothiazide  which she has not been taking recently. Will have physical therapy evaluation.  Right rib cage pain: Secondary to fall, continue as needed analgesics.  As needed tramadol , scheduled Tylenol  added.   History of MI s/p stent 12/20/2023: resume dual antiplatelet therapy if Hb stable Minimize interruption as much as possible   Diabetes mellitus: insulin  sliding scale insulin  coverage Will increase lantus  to 15 u daily.  Patient takes 40 units of Lantus  daily.  Will slowly escalate the dose  given poor intake.   Full CODE STATUS      Advance Care Planning:   Code Status: Full Code    Consults: GI   Family Communication: No family at the bedside   Severity of Illness: The appropriate patient status for this patient is INPATIENT. Inpatient status is judged to be reasonable and necessary in order to provide the required intensity of service to ensure the patient's safety. The patient's presenting symptoms, physical exam findings, and initial radiographic and laboratory data in the context of their chronic comorbidities is felt to place them at high risk for further clinical deterioration. Furthermore, it is not anticipated that the patient will be medically stable for discharge from the hospital within 2 midnights of admission.    * I certify that at the point of admission it is my clinical judgment that the patient will require inpatient hospital care spanning beyond 2 midnights from the point of admission due to high intensity of service, high risk for further deterioration and high frequency of surveillance required.*      Subjective:  Patient seen and examined at the bedside earlier tody.  Patient continues to report of pain everywhere.  She has chronic back pain and reports of pain in right lower rib cage, back, feels sore in abdomen.  Has not had any bowel movements yet.  Hemoglobin down to 7.8 from 8.8 yesterday.  Continues to feel dizzy..   Vitals:   03/13/24 0756 03/13/24 0839 03/13/24 1156 03/13/24 1210  BP: (!) 117/47 (!) 117/47 101/65 (!) 117/46  Pulse: 64 64 65 70  Resp: 16   16  Temp: 98.1 F (36.7 C)  98.2 F (36.8 C)  TempSrc: Oral   Temporal  SpO2: 100%  99% 99%  Weight:      Height:        Intake/Output Summary (Last 24 hours) at 03/13/2024 1441 Last data filed at 03/13/2024 1050 Gross per 24 hour  Intake 437 ml  Output 1800 ml  Net -1363 ml   Filed Weights   03/12/24 2134  Weight: 65 kg    Examination:  General exam: Appears calm and  comfortable  Respiratory system: Bilateral decreased breath sounds at bases Cardiovascular system: S1 & S2 heard, Rate controlled Gastrointestinal system: Abdomen is nondistended, soft and nontender. Normal bowel sounds heard. Extremities: No cyanosis, clubbing, edema  Central nervous system: Alert and oriented. No focal neurological deficits. Moving extremities Skin: No rashes, lesions or ulcers Psychiatry: Judgement and insight appear normal. Mood & affect appropriate.     Data Reviewed: I have personally reviewed following labs and imaging studies  CBC: Recent Labs  Lab 03/10/24 0239 03/10/24 2019 03/11/24 0951 03/12/24 0910 03/13/24 0325 03/13/24 1143  WBC 12.1*  --  9.2  --   --   --   NEUTROABS  --   --  5.3  --   --   --   HGB 7.2* 9.1* 8.4* 8.8* 7.9* 8.8*  HCT 21.7* 27.4* 25.1* 26.1* 23.7* 26.9*  MCV 96.4  --  91.6  --   --   --   PLT 230  --  219  --   --   --    Basic Metabolic Panel: Recent Labs  Lab 03/10/24 0239 03/11/24 0813  NA 144 140  K 4.0 4.3  CL 120* 111  CO2 19* 20*  GLUCOSE 101* 128*  BUN 63* 37*  CREATININE 1.38* 1.26*  CALCIUM 8.5* 9.1   GFR: Estimated Creatinine Clearance: 35.4 mL/min (A) (by C-G formula based on SCr of 1.26 mg/dL (H)). Liver Function Tests: No results for input(s): AST, ALT, ALKPHOS, BILITOT, PROT, ALBUMIN  in the last 168 hours. No results for input(s): LIPASE, AMYLASE in the last 168 hours. No results for input(s): AMMONIA in the last 168 hours. Coagulation Profile: Recent Labs  Lab 03/09/24 1759  INR 1.1   Cardiac Enzymes: No results for input(s): CKTOTAL, CKMB, CKMBINDEX, TROPONINI in the last 168 hours. BNP (last 3 results) No results for input(s): PROBNP in the last 8760 hours. HbA1C: No results for input(s): HGBA1C in the last 72 hours. CBG: Recent Labs  Lab 03/12/24 2011 03/13/24 0008 03/13/24 0427 03/13/24 0727 03/13/24 1155  GLUCAP 265* 232* 215* 194* 184*   Lipid  Profile: No results for input(s): CHOL, HDL, LDLCALC, TRIG, CHOLHDL, LDLDIRECT in the last 72 hours. Thyroid  Function Tests: No results for input(s): TSH, T4TOTAL, FREET4, T3FREE, THYROIDAB in the last 72 hours. Anemia Panel: No results for input(s): VITAMINB12, FOLATE, FERRITIN, TIBC, IRON, RETICCTPCT in the last 72 hours. Sepsis Labs: No results for input(s): PROCALCITON, LATICACIDVEN in the last 168 hours.  No results found for this or any previous visit (from the past 240 hours).       Radiology Studies: No results found.      Scheduled Meds:  sodium chloride    Intravenous Once   acetaminophen   650 mg Oral Q6H   aspirin  EC  81 mg Oral Daily   clopidogrel   75 mg Oral Daily   empagliflozin   10 mg Oral Daily   ezetimibe   10 mg Oral Daily   feeding supplement  237 mL Oral BID BM  insulin  aspart  0-9 Units Subcutaneous TID AC & HS   insulin  glargine  7 Units Subcutaneous Daily   levETIRAcetam   500 mg Oral BID   metoprolol  succinate  25 mg Oral Daily   nicotine   7 mg Transdermal Daily   pantoprazole   40 mg Oral BID AC   sodium chloride  flush  10-40 mL Intracatheter Q12H   sucralfate   1 g Oral TID WC & HS   Continuous Infusions:        Belva Koziel, MD [facility] 03/13/2024, 2:41 PM

## 2024-03-13 NOTE — Progress Notes (Signed)
 Physical Therapy Treatment Patient Details Name: Paula Hamilton MRN: [id number] DOB: July 12, 1950 Today's Date: 03/13/2024   History of Present Illness Patient is a 73 y/o female admitted 03/09/24 with syncope/fall, vomiting blood.  Found to have upper GI bleed.  Underwent EGD on 03/10/24.  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    PT Comments  Patient seen second session today as may need to be able to negotiate stairs at home at d/c.  Flight practiced today with CGA and pt states if goes to her grandaughters she can assist.  PT will continue to follow.     If plan is discharge home, recommend the following: A little help with walking and/or transfers;A little help with bathing/dressing/bathroom;Assistance with cooking/housework;Assist for transportation   Can travel by private vehicle        Equipment Recommendations  None recommended by PT    Recommendations for Other Services       Precautions / Restrictions Precautions Precautions: Fall Recall of Precautions/Restrictions: Intact     Mobility  Bed Mobility Overal bed mobility: Needs Assistance Bed Mobility: Supine to Sit, Sit to Supine     Supine to sit: Supervision Sit to supine: Supervision   General bed mobility comments: increased time, cues for technique    Transfers Overall transfer level: Needs assistance Equipment used: Rolling walker (2 wheels) Transfers: Sit to/from Stand Sit to Stand: Supervision           General transfer comment: stood unaided    Ambulation/Gait Ambulation/Gait assistance: Supervision, Contact guard assist Gait Distance (Feet): 200 Feet (x 2) Assistive device: Rolling walker (2 wheels) Gait Pattern/deviations: Step-to pattern, Step-through pattern, Decreased stride length       General Gait Details: stopped to sit to rest prior  to stair negotiation   Stairs Stairs: Yes Stairs assistance: Contact guard assist Stair Management: One rail Left, Step to pattern, Sideways Number of Stairs: 10 General stair comments: stopped to rest at top cues for sequence/technique   Wheelchair Mobility     Tilt Bed    Modified Rankin (Stroke Patients Only)       Balance Overall balance assessment: Needs assistance Sitting-balance support: Feet supported Sitting balance-Leahy Scale: Good       Standing balance-Leahy Scale: Fair Standing balance comment: toileted and washing hands without UE support at sink                            Communication Communication Communication: No apparent difficulties  Cognition Arousal: Alert Behavior During Therapy: WFL for tasks assessed/performed   PT - Cognitive impairments: No apparent impairments                         Following commands: Intact      Cueing    Exercises      General Comments General comments (skin integrity, edema, etc.): significant other in the room throughout      Pertinent Vitals/Pain Pain Assessment Pain Assessment: Faces Faces Pain Scale: Hurts little more Pain Location: back with stairs, transitions Pain Descriptors / Indicators: Aching, Sore Pain Intervention(s): Monitored during session, Repositioned    Home Living                          Prior Function  PT Goals (current goals can now be found in the care plan section) Progress towards PT goals: Progressing toward goals    Frequency    Min 3X/week      PT Plan      Co-evaluation              AM-PAC PT 6 Clicks Mobility   Outcome Measure  Help needed turning from your back to your side while in a flat bed without using bedrails?: A Little Help needed moving from lying on your back to sitting on the side of a flat bed without using bedrails?: A Little Help needed moving to and from a bed to a chair (including a  wheelchair)?: A Little Help needed standing up from a chair using your arms (e.g., wheelchair or bedside chair)?: A Little Help needed to walk in hospital room?: Total Help needed climbing 3-5 steps with a railing? : Total 6 Click Score: 14    End of Session Equipment Utilized During Treatment: Gait belt Activity Tolerance: Patient tolerated treatment well Patient left: in bed   PT Visit Diagnosis: Other abnormalities of gait and mobility (R26.89);Muscle weakness (generalized) (M62.81)     Time: 1520-1600 PT Time Calculation (min) (ACUTE ONLY): 40 min  Charges:    $Gait Training: 23-37 mins $Therapeutic Activity: 8-22 mins PT General Charges $$ ACUTE PT VISIT: 1 Visit                     Micheline Portal, PT Acute Rehabilitation Services [phone] 03/13/2024    Montie Portal 03/13/2024, 6:11 PM

## 2024-03-13 NOTE — Evaluation (Signed)
 Physical Therapy Evaluation Patient Details Name: Paula Hamilton MRN: [id number] DOB: [birth year] Today's Date: 03/13/2024  History of Present Illness  Patient is a 73 y/o female admitted 03/09/24 with syncope/fall, vomiting blood.  Found to have upper GI bleed.  Underwent EGD on 03/10/24.  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  Clinical Impression  Patient presents with decreased mobility due to pain, decreased balance, decreased strength and decreased activity tolerance.  Noted orthostatic, though minimal symptoms and ambulated after prolonged sitting at EOB.  Previously reports mobilizing on her own at home though furniture walking at times.  She was able to stand to wash hands at sink after toileting though with some LOB.  Report finace can assist.  She will benefit from skilled PT in the acute setting and from HHPT at d/c.  (Noted may d/c home with grandaughter home with stairs, will follow up for stair training)  Orthostatic VS for the past 24 hrs (Last 3 readings):  BP- Lying Pulse- Lying BP- Sitting Pulse- Sitting BP- Standing at 0 minutes Pulse- Standing at 0 minutes  03/13/24 1300 120/48 65 104/43 63 92/41 69       If plan is discharge home, recommend the following: A little help with walking and/or transfers;A little help with bathing/dressing/bathroom;Assistance with cooking/housework;Assist for transportation   Can travel by private vehicle        Equipment Recommendations None recommended by PT  Recommendations for Other Services       Functional Status Assessment Patient has had a recent decline in their functional status and demonstrates the ability to make significant improvements in function in a reasonable and predictable amount of time.     Precautions / Restrictions Precautions Precautions: Fall Recall of  Precautions/Restrictions: Intact      Mobility  Bed Mobility Overal bed mobility: Needs Assistance Bed Mobility: Supine to Sit     Supine to sit: Supervision     General bed mobility comments: increased time, cues for technique    Transfers Overall transfer level: Needs assistance Equipment used: Rolling walker (2 wheels) Transfers: Sit to/from Stand Sit to Stand: Contact guard assist           General transfer comment: for balance    Ambulation/Gait Ambulation/Gait assistance: Supervision, Contact guard assist Gait Distance (Feet): 180 Feet Assistive device: Rolling walker (2 wheels) Gait Pattern/deviations: Step-through pattern, Decreased stride length, Shuffle       General Gait Details: reliant on UE support with walker and short shuffling steps in hallway, though only occasional CGA for direction changes/turns  Stairs            Wheelchair Mobility     Tilt Bed    Modified Rankin (Stroke Patients Only)       Balance Overall balance assessment: Needs assistance   Sitting balance-Leahy Scale: Good       Standing balance-Leahy Scale: Fair Standing balance comment: static balance without UE support though RW needed for ambulation                             Pertinent Vitals/Pain Pain Assessment Pain Assessment: Faces Faces Pain Scale: Hurts even more Pain Location: back of head Ribs on R and hip on R from fall, chronic back pain Pain Descriptors / Indicators: Aching, Sore Pain Intervention(s): Monitored during session    Home Living  Family/patient expects to be discharged to:: Private residence Living Arrangements: Spouse/significant other Available Help at Discharge: Family;Available 24 hours/day Type of Home: Mobile home Home Access: Stairs to enter Entrance Stairs-Rails: Right;Left Entrance Stairs-Number of Steps: 3   Home Layout: One level Home Equipment: Agricultural consultant (2 wheels);Rollator (4 wheels);Cane - single  point      Prior Function Prior Level of Function : Needs assist             Mobility Comments: uses rollator out of the house, admits to using furniture in the house, admits to poor vision; fell just prior to admission ADLs Comments: mike helps her in and out of shower     Extremity/Trunk Assessment   Upper Extremity Assessment Upper Extremity Assessment: Generalized weakness    Lower Extremity Assessment Lower Extremity Assessment: Generalized weakness    Cervical / Trunk Assessment Cervical / Trunk Assessment: Other exceptions Cervical / Trunk Exceptions: h/o back surgery and chronic back pain  Communication   Communication Communication: No apparent difficulties    Cognition Arousal: Alert Behavior During Therapy: WFL for tasks assessed/performed, Anxious   PT - Cognitive impairments: No apparent impairments                         Following commands: Intact       Cueing       General Comments General comments (skin integrity, edema, etc.): see flowsheet for BP measures, denied dizziness during ambulation, toileted in bathroom and washed hands at sink with intermittent CGA.    Exercises     Assessment/Plan    PT Assessment Patient needs continued PT services  PT Problem List Decreased activity tolerance;Decreased strength;Decreased balance;Decreased mobility;Impaired sensation;Decreased knowledge of use of DME       PT Treatment Interventions DME instruction;Gait training;Stair training;Functional mobility training;Therapeutic activities;Therapeutic exercise;Balance training;Patient/family education    PT Goals (Current goals can be found in the Care Plan section)  Acute Rehab PT Goals Patient Stated Goal: return to independent PT Goal Formulation: With patient Time For Goal Achievement: 03/27/24 Potential to Achieve Goals: Good    Frequency Min 3X/week     Co-evaluation               AM-PAC PT 6 Clicks Mobility   Outcome Measure Help needed turning from your back to your side while in a flat bed without using bedrails?: A Little Help needed moving from lying on your back to sitting on the side of a flat bed without using bedrails?: A Little Help needed moving to and from a bed to a chair (including a wheelchair)?: A Little Help needed standing up from a chair using your arms (e.g., wheelchair or bedside chair)?: A Little Help needed to walk in hospital room?: A Little Help needed climbing 3-5 steps with a railing? : Total 6 Click Score: 16    End of Session Equipment Utilized During Treatment: Gait belt Activity Tolerance: Patient tolerated treatment well Patient left: in chair;with call bell/phone within reach;with chair alarm set   PT Visit Diagnosis: Other abnormalities of gait and mobility (R26.89);Muscle weakness (generalized) (M62.81)    Time: 8899-8867 PT Time Calculation (min) (ACUTE ONLY): 32 min   Charges:   PT Evaluation $PT Eval Moderate Complexity: 1 Mod PT Treatments $Gait Training: 8-22 mins PT General Charges $$ ACUTE PT VISIT: 1 Visit         Micheline Portal, PT Acute Rehabilitation Services Office:[phone] 03/13/2024   Montie Portal 03/13/2024, 1:34 PM

## 2024-03-13 NOTE — Inpatient Diabetes Management (Signed)
 Inpatient Diabetes Program Recommendations  AACE/ADA: New Consensus Statement on Inpatient Glycemic Control (2015)  Target Ranges:  Prepandial:   less than 140 mg/dL      Peak postprandial:   less than 180 mg/dL (1-2 hours)      Critically ill patients:  140 - 180 mg/dL   Lab Results  Component Value Date   GLUCAP 194 (H) 03/13/2024   HGBA1C 8.0 (H) 02/16/2024    Review of Glycemic Control  Latest Reference Range & Units 03/12/24 16:17 03/12/24 20:11 03/13/24 00:08 03/13/24 04:27 03/13/24 07:27  Glucose-Capillary 70 - 99 mg/dL 841 (H) 734 (H) 767 (H) 215 (H) 194 (H)  Diabetes history: DM2 Outpatient Diabetes medications: Lantus  40 units daily Current orders for Inpatient glycemic control: Jardiance  10 mg daily Lantus  7 units daily Novolog  0-9 units tid with meals and HS Inpatient Diabetes Program Recommendations:    Please consider increasing Lantus  to 15 units daily.   Thanks,  Randall Bullocks, RN, BC-ADM Inpatient Diabetes Coordinator Pager [phone]  (8a-5p)

## 2024-03-14 ENCOUNTER — Telehealth: Payer: Self-pay | Admitting: Pediatrics

## 2024-03-14 ENCOUNTER — Telehealth: Payer: Self-pay

## 2024-03-14 DIAGNOSIS — K269 Duodenal ulcer, unspecified as acute or chronic, without hemorrhage or perforation: Secondary | ICD-10-CM

## 2024-03-14 LAB — BASIC METABOLIC PANEL WITH GFR
Anion gap: 9 (ref 5–15)
BUN: 29 mg/dL — ABNORMAL HIGH (ref 8–23)
CO2: 23 mmol/L (ref 22–32)
Calcium: 8.9 mg/dL (ref 8.9–10.3)
Chloride: 109 mmol/L (ref 98–111)
Creatinine, Ser: 1.51 mg/dL — ABNORMAL HIGH (ref 0.44–1.00)
GFR, Estimated: 37 mL/min — ABNORMAL LOW (ref >60)
Glucose, Bld: 161 mg/dL — ABNORMAL HIGH (ref 70–99)
Potassium: 4.7 mmol/L (ref 3.5–5.1)
Sodium: 141 mmol/L (ref 135–145)

## 2024-03-14 LAB — HEMOGLOBIN AND HEMATOCRIT, BLOOD
HCT: 24.6 % — ABNORMAL LOW (ref 36.0–46.0)
Hemoglobin: 8.2 g/dL — ABNORMAL LOW (ref 12.0–15.0)

## 2024-03-14 LAB — GLUCOSE, CAPILLARY
Glucose-Capillary: 146 mg/dL — ABNORMAL HIGH (ref 70–99)
Glucose-Capillary: 154 mg/dL — ABNORMAL HIGH (ref 70–99)
Glucose-Capillary: 160 mg/dL — ABNORMAL HIGH (ref 70–99)
Glucose-Capillary: 231 mg/dL — ABNORMAL HIGH (ref 70–99)

## 2024-03-14 MED ORDER — CLOPIDOGREL BISULFATE 75 MG PO TABS
75.0000 mg | ORAL_TABLET | Freq: Every day | ORAL | Status: DC
  Filled 2024-03-14: qty 1

## 2024-03-14 MED ORDER — PANTOPRAZOLE SODIUM 40 MG PO TBEC: 40.0000 mg | DELAYED_RELEASE_TABLET | Freq: Two times a day (BID) | ORAL | 0 refills | Status: DC

## 2024-03-14 NOTE — Progress Notes (Signed)
 Patient ID: Paula Hamilton, female   DOB: 1951/06/23, 73 y.o.   MRN: [id number]    Progress Note   Subjective   Day # 4 CC; weakness, hematemesis and anemia in setting of Plavix  and aspirin  EGD was done 03/10/2024 with finding of 1 nonbleeding pyloric channel ulcer that was treated with endoclips x 2 and Purastat to prevent future bleeding.  No active bleeding seen at the time of EGD.  Patient had not had a bowel movement for several days, did have bowel movements last night which she reported as being very dark/black but after a couple of bowel movements the stool lightened up to more normal-appearing stool.  She says usually when this happens she has diarrhea.  She has not had any further bowel movements today.  She has not eaten very much today but no vomiting.  Hemoglobin 8.2/hematocrit 24.6-overall this is stable-hemoglobin was 7.9 yesterday morning and 8.4 on the morning of 03/11/2024  Patient is very anxious and husband states that they are finding this exhausting and they would like to take her home.  There had been concern earlier in the day about her actively bleeding but that does not appear to be the case.   Objective   Vital signs in last 24 hours: Temp:  [97.9 F (36.6 C)-98.2 F (36.8 C)] 97.9 F (36.6 C) (09/10 1139) Pulse Rate:  [66-79] 76 (09/10 1139) Resp:  [20] 20 (09/10 0423) BP: (115-146)/(45-58) 146/58 (09/10 1139) SpO2:  [95 %-100 %] 97 % (09/10 1139) Last BM Date : 03/13/24 General: Older   white female in NAD Heart:  Regular rate and rhythm; no murmurs Lungs: Respirations even and unlabored, lungs CTA bilaterally Abdomen:  Soft, Mild tenderness in the epigastrium, nondistended. Normal bowel sounds. Extremities:  Without edema. Neurologic:  Alert and oriented,  grossly normal neurologically. Psych:  Cooperative. Normal mood and affect.  Intake/Output from previous day: 09/09 0701 - 09/10 0700 In: 437 [P.O.:427; I.V.:10] Out: 950  [Urine:950] Intake/Output this shift: No intake/output data recorded.  Lab Results: Recent Labs    03/13/24 0325 03/13/24 1143 03/14/24 0131  HGB 7.9* 8.8* 8.2*  HCT 23.7* 26.9* 24.6*   BMET Recent Labs    03/14/24 0131  NA 141  K 4.7  CL 109  CO2 23  GLUCOSE 161*  BUN 29*  CREATININE 1.51*  CALCIUM 8.9   LFT No results for input(s): PROT, ALBUMIN , AST, ALT, ALKPHOS, BILITOT, BILIDIR, IBILI in the last 72 hours. PT/INR No results for input(s): LABPROT, INR in the last 72 hours.      Assessment / Plan:    #80 73 year old white female admitted with weakness and anemia setting of aspirin  and Plavix   EGD with finding of nonbleeding pyloric channel ulcer which was treated with endoclips and hemostatic spray to prevent future bleeding.  No active bleeding at the time of procedure.  Hemoglobin has overall been stable over the past 48 hours, 8.2 this morning  Patient had some melena/dark stool last night but that was the first bowel movement in several days.  Stool lightened up after 2 bowel movements and she has not had any further bowel movements today.  #2 coronary artery disease status post PCI with 2 stents June 2025 with MI #3 history of CVA  Plan; patient is stable from GI perspective, she is not having evidence of ongoing GI bleeding They are requesting discharge home and that is okay from GI perspective. She will need twice daily Protonix  x 8 weeks and then  once daily thereafter long-term Patient has follow-up with her PCP Dr. Dottie in [place] on Friday I have asked her to stay off of Plavix  until then sure her hemoglobin is stable as long as hemoglobin stable then she can resume the Plavix .  Will also need another follow-up CBC through her PCP next week. Our office will arrange for outpatient follow-up with Dr. Charlanne and she will be called with that appointment      Principal Problem:   GI bleeding Active Problems:   Hematemesis with  nausea   ABLA (acute blood loss anemia)   Gastric ulcer with hemorrhage     LOS: 5 days   Hebert Dooling PA-C 03/14/2024, 3:43 PM

## 2024-03-14 NOTE — Telephone Encounter (Signed)
 Pt viewed lab results on My Chart per Dr. Karry note. Routed to PCP.

## 2024-03-14 NOTE — Progress Notes (Addendum)
 Transition of Care [facility]) - Inpatient Brief Assessment   Patient Details  Name: Paula Hamilton MRN: [id number] Date of Birth: Feb 21, 1951  Transition of Care [facility]) CM/SW Contact:    Rosaline JONELLE Joe, RN Phone Number: 03/14/2024, 10:22 AM   Clinical Narrative: Patient admitted to the hospital from home with GI bleed.  Patient lives at home with significant other and now plans to return home with significant other when stable.  DME at the home includes Rw, Rolator, Cane.  Patient was offered Medicare choice regarding home health and patient did not have a preference.  I called [facility] and Artavia, RNCM accepted for services.  HH order for [facility] PT placed to be co-signed by MD.  No other IP Care management needs and plans to have family to provide transportation to home by family car.  Patient states that she is continuing to have bleeding.  CM spoke with PT and 3:1 is needed.  I called [employer] and requested delivery of 3:1 to the bedside.  DME ordered and co-signed by MD.   Transition of Care Asessment: Insurance and Status: Insurance coverage has been reviewed Patient has primary care physician: Yes Home environment has been reviewed: from home with significant other Prior level of function:: (P) self Prior/Current Home Services: (P) Current home services (DME at the home includes RW, Rolator, Medical laboratory scientific officer) Social Drivers of Health Review: (P) SDOH reviewed interventions complete Readmission risk has been reviewed: (P) Yes Transition of care needs: (P) transition of care needs identified, TOC will continue to follow

## 2024-03-14 NOTE — Telephone Encounter (Signed)
 Inbound call from patients husband stating wife recently had a EGD procedure done at the [facility] hospital on 03/10/24. Patients husband states that they went to the hospital and was told wife was bleeding internally but is not sure if that's true would like to speak to nurse. Patient husband requesting a call back  Please advise  Thank you

## 2024-03-14 NOTE — Plan of Care (Signed)
  Problem: Education: Goal: Knowledge of General Education information will improve Description: Including pain rating scale, medication(s)/side effects and non-pharmacologic comfort measures Outcome: Progressing   Problem: Health Behavior/Discharge Planning: Goal: Ability to manage health-related needs will improve Outcome: Progressing   Problem: Clinical Measurements: Goal: Ability to maintain clinical measurements within normal limits will improve Outcome: Progressing Goal: Will remain free from infection Outcome: Progressing Goal: Diagnostic test results will improve Outcome: Progressing Goal: Respiratory complications will improve Outcome: Progressing Goal: Cardiovascular complication will be avoided Outcome: Progressing   Problem: Activity: Goal: Risk for activity intolerance will decrease Outcome: Progressing   Problem: Nutrition: Goal: Adequate nutrition will be maintained Outcome: Progressing   Problem: Coping: Goal: Level of anxiety will decrease Outcome: Progressing   Problem: Elimination: Goal: Will not experience complications related to bowel motility Outcome: Progressing Goal: Will not experience complications related to urinary retention Outcome: Progressing   Problem: Pain Managment: Goal: General experience of comfort will improve and/or be controlled Outcome: Progressing   Problem: Safety: Goal: Ability to remain free from injury will improve Outcome: Progressing   Problem: Skin Integrity: Goal: Risk for impaired skin integrity will decrease Outcome: Progressing   Problem: Education: Goal: Ability to describe self-care measures that may prevent or decrease complications (Diabetes Survival Skills Education) will improve Outcome: Progressing Goal: Individualized Educational Video(s) Outcome: Progressing   Problem: Coping: Goal: Ability to adjust to condition or change in health will improve Outcome: Progressing   Problem: Fluid  Volume: Goal: Ability to maintain a balanced intake and output will improve Outcome: Progressing   Problem: Health Behavior/Discharge Planning: Goal: Ability to identify and utilize available resources and services will improve Outcome: Progressing Goal: Ability to manage health-related needs will improve Outcome: Progressing   Problem: Metabolic: Goal: Ability to maintain appropriate glucose levels will improve Outcome: Progressing   Problem: Nutritional: Goal: Maintenance of adequate nutrition will improve Outcome: Progressing Goal: Progress toward achieving an optimal weight will improve Outcome: Progressing   Problem: Skin Integrity: Goal: Risk for impaired skin integrity will decrease Outcome: Progressing   Problem: Tissue Perfusion: Goal: Adequacy of tissue perfusion will improve Outcome: Progressing   Problem: Education: Goal: Ability to identify signs and symptoms of gastrointestinal bleeding will improve Outcome: Progressing   Problem: Bowel/Gastric: Goal: Will show no signs and symptoms of gastrointestinal bleeding Outcome: Progressing   Problem: Fluid Volume: Goal: Will show no signs and symptoms of excessive bleeding Outcome: Progressing   Problem: Clinical Measurements: Goal: Complications related to the disease process, condition or treatment will be avoided or minimized Outcome: Progressing

## 2024-03-14 NOTE — Plan of Care (Signed)
 Problem: Education: Goal: Knowledge of General Education information will improve Description: Including pain rating scale, medication(s)/side effects and non-pharmacologic comfort measures 03/14/2024 1853 by Rosalynn Warren DASEN, RN Outcome: Progressing 03/14/2024 1810 by Rosalynn Warren DASEN, RN Outcome: Progressing   Problem: Health Behavior/Discharge Planning: Goal: Ability to manage health-related needs will improve 03/14/2024 1853 by Rosalynn Warren DASEN, RN Outcome: Progressing 03/14/2024 1810 by Rosalynn Warren DASEN, RN Outcome: Progressing   Problem: Clinical Measurements: Goal: Ability to maintain clinical measurements within normal limits will improve 03/14/2024 1853 by Rosalynn Warren DASEN, RN Outcome: Progressing 03/14/2024 1810 by Rosalynn Warren DASEN, RN Outcome: Progressing Goal: Will remain free from infection 03/14/2024 1853 by Rosalynn Warren DASEN, RN Outcome: Progressing 03/14/2024 1810 by Rosalynn Warren DASEN, RN Outcome: Progressing Goal: Diagnostic test results will improve 03/14/2024 1853 by Rosalynn Warren DASEN, RN Outcome: Progressing 03/14/2024 1810 by Rosalynn Warren DASEN, RN Outcome: Progressing Goal: Respiratory complications will improve 03/14/2024 1853 by Rosalynn Warren DASEN, RN Outcome: Progressing 03/14/2024 1810 by Rosalynn Warren DASEN, RN Outcome: Progressing Goal: Cardiovascular complication will be avoided 03/14/2024 1853 by Rosalynn Warren DASEN, RN Outcome: Progressing 03/14/2024 1810 by Rosalynn Warren DASEN, RN Outcome: Progressing   Problem: Activity: Goal: Risk for activity intolerance will decrease 03/14/2024 1853 by Rosalynn Warren DASEN, RN Outcome: Progressing 03/14/2024 1810 by Rosalynn Warren DASEN, RN Outcome: Progressing   Problem: Nutrition: Goal: Adequate nutrition will be maintained 03/14/2024 1853 by Rosalynn Warren DASEN, RN Outcome: Progressing 03/14/2024 1810 by Rosalynn Warren DASEN, RN Outcome: Progressing   Problem: Coping: Goal: Level of anxiety will decrease 03/14/2024 1853 by Rosalynn Warren DASEN, RN Outcome: Progressing 03/14/2024 1810 by Rosalynn Warren DASEN, RN Outcome: Progressing   Problem: Elimination: Goal: Will not experience complications related to bowel motility 03/14/2024 1853 by Rosalynn Warren DASEN, RN Outcome: Progressing 03/14/2024 1810 by Rosalynn Warren DASEN, RN Outcome: Progressing Goal: Will not experience complications related to urinary retention 03/14/2024 1853 by Rosalynn Warren DASEN, RN Outcome: Progressing 03/14/2024 1810 by Rosalynn Warren DASEN, RN Outcome: Progressing   Problem: Pain Managment: Goal: General experience of comfort will improve and/or be controlled 03/14/2024 1853 by Rosalynn Warren DASEN, RN Outcome: Progressing 03/14/2024 1810 by Rosalynn Warren DASEN, RN Outcome: Progressing   Problem: Safety: Goal: Ability to remain free from injury will improve 03/14/2024 1853 by Rosalynn Warren DASEN, RN Outcome: Progressing 03/14/2024 1810 by Rosalynn Warren DASEN, RN Outcome: Progressing   Problem: Skin Integrity: Goal: Risk for impaired skin integrity will decrease 03/14/2024 1853 by Rosalynn Warren DASEN, RN Outcome: Progressing 03/14/2024 1810 by Rosalynn Warren DASEN, RN Outcome: Progressing   Problem: Education: Goal: Ability to describe self-care measures that may prevent or decrease complications (Diabetes Survival Skills Education) will improve 03/14/2024 1853 by Rosalynn Warren DASEN, RN Outcome: Progressing 03/14/2024 1810 by Rosalynn Warren DASEN, RN Outcome: Progressing Goal: Individualized Educational Video(s) 03/14/2024 1853 by Rosalynn Warren DASEN, RN Outcome: Progressing 03/14/2024 1810 by Rosalynn Warren DASEN, RN Outcome: Progressing   Problem: Coping: Goal: Ability to adjust to condition or change in health will improve 03/14/2024 1853 by Rosalynn Warren DASEN, RN Outcome: Progressing 03/14/2024 1810 by Rosalynn Warren DASEN, RN Outcome: Progressing   Problem: Fluid Volume: Goal: Ability to maintain a balanced intake and output will improve 03/14/2024 1853 by Rosalynn Warren DASEN,  RN Outcome: Progressing 03/14/2024 1810 by Rosalynn Warren DASEN, RN Outcome: Progressing   Problem: Health Behavior/Discharge Planning: Goal: Ability to identify and utilize available resources and services will improve 03/14/2024 1853 by Rosalynn Warren DASEN, RN Outcome: Progressing 03/14/2024 1810 by  Rosalynn Warren DASEN, RN Outcome: Progressing Goal: Ability to manage health-related needs will improve 03/14/2024 1853 by Rosalynn Warren DASEN, RN Outcome: Progressing 03/14/2024 1810 by Rosalynn Warren DASEN, RN Outcome: Progressing   Problem: Metabolic: Goal: Ability to maintain appropriate glucose levels will improve 03/14/2024 1853 by Rosalynn Warren DASEN, RN Outcome: Progressing 03/14/2024 1810 by Rosalynn Warren DASEN, RN Outcome: Progressing   Problem: Nutritional: Goal: Maintenance of adequate nutrition will improve 03/14/2024 1853 by Rosalynn Warren DASEN, RN Outcome: Progressing 03/14/2024 1810 by Rosalynn Warren DASEN, RN Outcome: Progressing Goal: Progress toward achieving an optimal weight will improve 03/14/2024 1853 by Rosalynn Warren DASEN, RN Outcome: Progressing 03/14/2024 1810 by Rosalynn Warren DASEN, RN Outcome: Progressing   Problem: Skin Integrity: Goal: Risk for impaired skin integrity will decrease 03/14/2024 1853 by Rosalynn Warren DASEN, RN Outcome: Progressing 03/14/2024 1810 by Rosalynn Warren DASEN, RN Outcome: Progressing   Problem: Tissue Perfusion: Goal: Adequacy of tissue perfusion will improve 03/14/2024 1853 by Rosalynn Warren DASEN, RN Outcome: Progressing 03/14/2024 1810 by Rosalynn Warren DASEN, RN Outcome: Progressing   Problem: Education: Goal: Ability to identify signs and symptoms of gastrointestinal bleeding will improve 03/14/2024 1853 by Rosalynn Warren DASEN, RN Outcome: Progressing 03/14/2024 1810 by Rosalynn Warren DASEN, RN Outcome: Progressing   Problem: Bowel/Gastric: Goal: Will show no signs and symptoms of gastrointestinal bleeding 03/14/2024 1853 by Rosalynn Warren DASEN, RN Outcome:  Progressing 03/14/2024 1810 by Rosalynn Warren DASEN, RN Outcome: Progressing   Problem: Fluid Volume: Goal: Will show no signs and symptoms of excessive bleeding 03/14/2024 1853 by Rosalynn Warren DASEN, RN Outcome: Progressing 03/14/2024 1810 by Rosalynn Warren DASEN, RN Outcome: Progressing   Problem: Clinical Measurements: Goal: Complications related to the disease process, condition or treatment will be avoided or minimized 03/14/2024 1853 by Rosalynn Warren DASEN, RN Outcome: Progressing 03/14/2024 1810 by Rosalynn Warren DASEN, RN Outcome: Progressing

## 2024-03-14 NOTE — Telephone Encounter (Signed)
 Returned call to patient's husband. Patient's husband is very frustrated that no one has been by to see them regarding next steps in patient's care. I reviewed procedure report with him. Advised that Dr. Nandigam did come by and follow up with them on Monday from GI standpoint. I told patient's husband that I will send a note to Dr. Shila because they still have concerns. Advised that the hospitalist will be able to address other concerns of when patient is discharged, etc. Patient's husband stated that patient had been crying because they don't know what is going on. Advised that per Dr. Trenna note patient was not actively bleeding. Advised that they need to address concerns with inpatient team. Patient's husband verbalized understanding.   Secure chat sent to Dr. Shila, her response: no plan to see her, we communicated with hospitalist doctor. We will try to stop by if possible, we are slammed and trying to take care of emergencies

## 2024-03-14 NOTE — Progress Notes (Signed)
 Physical Therapy Treatment Patient Details Name: Paula Hamilton MRN: [id number] DOB: September 26, 1950 Today's Date: 03/14/2024   History of Present Illness Patient is a 73 y/o female admitted 03/09/24 with syncope/fall, vomiting blood.  Found to have upper GI bleed.  Underwent EGD on 03/10/24.  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    PT Comments  Patient progressing with mobility.  Still with dizziness and initial BP drop though recovered in standing.  Discussed orthostatic precautions and slow to rise with pt and significant other.  PT will follow while admitted, pt eager to go home.   Orthostatic VS for the past 24 hrs (Last 3 readings):  BP- Sitting Pulse- Sitting BP- Standing at 0 minutes Pulse- Standing at 0 minutes BP- Standing at 3 minutes Pulse- Standing at 3 minutes  03/14/24 1405 111/48 78 96/51 90 113/55 100      If plan is discharge home, recommend the following: A little help with walking and/or transfers;A little help with bathing/dressing/bathroom;Assistance with cooking/housework;Assist for transportation   Can travel by private vehicle        Equipment Recommendations  None recommended by PT    Recommendations for Other Services       Precautions / Restrictions Precautions Precautions: Fall Recall of Precautions/Restrictions: Intact Precaution/Restrictions Comments: watch BP     Mobility  Bed Mobility Overal bed mobility: Modified Independent             General bed mobility comments: increased time, HOB up    Transfers Overall transfer level: Needs assistance Equipment used: Rolling walker (2 wheels) Transfers: Sit to/from Stand Sit to Stand: Supervision, Contact guard assist           General transfer comment: CGA initially due to reports of dizziness    Ambulation/Gait Ambulation/Gait  assistance: Supervision, Contact guard assist Gait Distance (Feet): 200 Feet (2 standing rest breaks) Assistive device: Rolling walker (2 wheels) Gait Pattern/deviations: Step-through pattern, Step-to pattern, Decreased stride length       General Gait Details: occasional CGA for balance during standing rest breaks   Stairs             Wheelchair Mobility     Tilt Bed    Modified Rankin (Stroke Patients Only)       Balance Overall balance assessment: Needs assistance Sitting-balance support: Feet supported Sitting balance-Leahy Scale: Good     Standing balance support: No upper extremity supported Standing balance-Leahy Scale: Fair                              Hotel manager: No apparent difficulties  Cognition Arousal: Alert Behavior During Therapy: WFL for tasks assessed/performed   PT - Cognitive impairments: No apparent impairments                         Following commands: Intact      Cueing Cueing Techniques: Verbal cues  Exercises      General Comments General comments (skin integrity, edema, etc.): fiance' present and supportive      Pertinent Vitals/Pain Pain Assessment Pain Assessment: Faces Faces Pain Scale: Hurts little more Pain Location: back Pain Descriptors / Indicators: Aching, Sore Pain Intervention(s): Monitored during session    Home Living  Prior Function            PT Goals (current goals can now be found in the care plan section) Progress towards PT goals: Progressing toward goals    Frequency    Min 3X/week      PT Plan      Co-evaluation              AM-PAC PT 6 Clicks Mobility   Outcome Measure  Help needed turning from your back to your side while in a flat bed without using bedrails?: None Help needed moving from lying on your back to sitting on the side of a flat bed without using bedrails?: A Little Help  needed moving to and from a bed to a chair (including a wheelchair)?: A Little Help needed standing up from a chair using your arms (e.g., wheelchair or bedside chair)?: A Little Help needed to walk in hospital room?: A Little Help needed climbing 3-5 steps with a railing? : A Little 6 Click Score: 19    End of Session Equipment Utilized During Treatment: Gait belt Activity Tolerance: Patient tolerated treatment well Patient left: in bed;with call bell/phone within reach;with family/visitor present   PT Visit Diagnosis: Other abnormalities of gait and mobility (R26.89);Muscle weakness (generalized) (M62.81)     Time: 8644-8580 PT Time Calculation (min) (ACUTE ONLY): 24 min  Charges:    $Gait Training: 8-22 mins $Therapeutic Activity: 8-22 mins PT General Charges $$ ACUTE PT VISIT: 1 Visit                     Micheline Hamilton, PT Acute Rehabilitation Services Office:[phone] 03/14/2024    Paula Hamilton 03/14/2024, 4:58 PM

## 2024-03-14 NOTE — Discharge Summary (Signed)
 Physician Discharge Summary   Patient: Paula Hamilton MRN: [id number] DOB: [birth year]  Admit date:     03/09/2024  Discharge date: 03/14/24  Discharge Physician: Deliliah Room   PCP: [name], MD   Recommendations at discharge:    Follow up with your PCP on the scheduled appointment. Continue taking meds as prescribed. Follow up with GI. Call to make an appointment Stay off plavix  until you see your PCP . It can be resumed if your Hb is stable and you have no evidence of active GI bleed.  Discharge Diagnoses: Principal Problem:   GI bleeding Active Problems:   Hematemesis with nausea   ABLA (acute blood loss anemia)   Gastric ulcer with hemorrhage   Hospital Course:  73 year old female with history of recent MI on dual antiplatelet therapy since 12/20/2023, hypertension, chronic back pain, diabetes mellitus is admitted for evaluation of dizziness/episode of hematemesis and acute blood loss anemia. EGD per GI 9/6: Nonbleeding cratered gastric ulcer noted which was clipped. DAPT restarted on 9/9. She did have melena on the night of 9/9. GI saw her on the day of discharge and the recommendation is to take aspirin  but hold off on plavix  until she sees her PCP on Friday. GI will arrange outpatient follow up as well.        Consultants: GI Procedures performed: EGD  Disposition: Home Diet recommendation:  Cardiac diet DISCHARGE MEDICATION: Allergies as of 03/14/2024       Reactions   Codeine Rash, Swelling   Penicillins Rash   PATIENT HAS HAD A PCN REACTION WITH IMMEDIATE RASH, FACIAL/TONGUE/THROAT SWELLING, SOB, OR LIGHTHEADEDNESS WITH HYPOTENSION:  #  #  #  YES  #  #  #   Has patient had a PCN reaction causing severe rash involving mucus membranes or skin necrosis: No Has patient had a PCN reaction that required hospitalization: No Has patient had a PCN reaction occurring within the last 10 years: No   Pregabalin  Other (See Comments)   Suicidal thoughts,  extreme depression   Atorvastatin    Doxycycline Swelling   Metformin Other (See Comments)   Profound muscle weakness whren mixed w/actos or januvia   Erythromycin Base Nausea And Vomiting   STOMACH PAIN   Morphine  And Codeine Nausea And Vomiting   Has taken in pill form w/o side effects        Medication List     STOP taking these medications    clopidogrel  75 MG tablet Commonly known as: PLAVIX        TAKE these medications    aspirin  EC 81 MG tablet Take 1 tablet (81 mg total) by mouth daily. Swallow whole.   Blood Glucose Monitoring Suppl Devi 1 each by Does not apply route as needed. May substitute to any manufacturer covered by patient's insurance.   diphenoxylate -atropine  2.5-0.025 MG tablet Commonly known as: Lomotil  Take 1 tablet by mouth 3 (three) times daily as needed for diarrhea or loose stools.   DULoxetine  30 MG capsule Commonly known as: Cymbalta  Take 1 capsule (30 mg total) by mouth daily.   empagliflozin  10 MG Tabs tablet Commonly known as: Jardiance  Take 1 tablet (10 mg total) by mouth daily before breakfast.   ezetimibe  10 MG tablet Commonly known as: ZETIA  Take 10 mg by mouth daily.   hydrochlorothiazide  12.5 MG capsule Commonly known as: MICROZIDE  Take 12.5 mg by mouth daily.   Lantus  SoloStar 100 UNIT/ML Solostar Pen Generic drug: insulin  glargine Inject 40 Units into the  skin in the morning.   levETIRAcetam  500 MG tablet Commonly known as: KEPPRA  Take 1 tablet (500 mg total) by mouth 2 (two) times daily.   lisinopril  20 MG tablet Commonly known as: ZESTRIL  Take 20 mg by mouth in the morning.   metoprolol  succinate 25 MG 24 hr tablet Commonly known as: TOPROL -XL Take 1 tablet (25 mg total) by mouth daily.   Nexlizet  180-10 MG Tabs Generic drug: Bempedoic Acid-Ezetimibe  Take 1 tablet by mouth daily.   nitroGLYCERIN  0.4 MG SL tablet Commonly known as: NITROSTAT  Place 1 tablet (0.4 mg total) under the tongue every 5 (five)  minutes as needed. What changed: reasons to take this   pantoprazole  40 MG tablet Commonly known as: PROTONIX  Take 1 tablet (40 mg total) by mouth 2 (two) times daily before a meal. Start taking on: March 15, 2024               Durable Medical Equipment  (From admission, onward)           Start     Ordered   03/14/24 1425  For home use only DME Bedside commode  Once       Comments: 3:1 is needed to be used at patient's bedside due to decreased mobility/diarrhea  Question:  Patient needs a bedside commode to treat with the following condition  Answer:  GI bleeding   03/14/24 1425            Follow-up Information     [facility] Follow up.   Why: [facility] will deliver a 3:1 to your hospital room before you are discharged home. Contact information: [address] [place] [address] [phone]         [facility]  Follow up.   Why: [facility].  They will call you in the next 24-48 hours to set up services for PT. Contact information: [address] [place] [address] [phone]         [name], MD. Go on 03/16/2024.   Specialty: Family Medicine Contact information: [address] [place] [address] [phone]         Ever Greig RAMAN, PA-C. Schedule an appointment as soon as possible for a visit in 1 month(s).   Specialty: Gastroenterology Contact information: [address] [place] [address] [phone]                Discharge Exam: Filed Weights   03/12/24 2134  Weight: 65 kg   Constitutional: NAD, calm, comfortable Eyes: PERRL, lids and conjunctivae normal ENMT: Mucous membranes are moist. Posterior pharynx clear of any exudate or lesions.Normal dentition.  Neck: normal, supple, no masses, no thyromegaly Respiratory: clear to auscultation bilaterally, no wheezing, no crackles. Normal respiratory  effort. No accessory muscle use.  Cardiovascular: Regular rate and rhythm, no murmurs / rubs / gallops. No extremity edema. 2+ pedal pulses. No carotid bruits.  Abdomen: no tenderness, no masses palpated. No hepatosplenomegaly. Bowel sounds positive.  Musculoskeletal: no clubbing / cyanosis. No joint deformity upper and lower extremities. Good ROM, no contractures. Normal muscle tone.  Skin: no rashes, lesions, ulcers. No induration Neurologic: CN 2-12 grossly intact. Sensation intact, DTR normal. Strength 5/5 x all 4 extremities.  Psychiatric: Normal judgment and insight. Alert and oriented x 3. Normal mood.    Condition at discharge: good  The results of significant diagnostics from this hospitalization (including imaging, microbiology, ancillary  and laboratory) are listed below for reference.   Imaging Studies: No results found.  Microbiology: Results for orders placed or performed during the hospital encounter of 05/04/23  Surgical pcr screen     Status: None   Collection Time: 05/04/23  3:11 PM   Specimen: Nasal Mucosa; Nasal Swab  Result Value Ref Range Status   MRSA, PCR NEGATIVE NEGATIVE Final   Staphylococcus aureus NEGATIVE NEGATIVE Final    Comment: (NOTE) The Xpert SA Assay (FDA approved for NASAL specimens in patients 78 years of age and older), is one component of a comprehensive surveillance program. It is not intended to diagnose infection nor to guide or monitor treatment. Performed at [facility], [address], [place], [place] [address]     Labs: CBC: Recent Labs  Lab 03/10/24 0239 03/10/24 2019 03/11/24 0951 03/12/24 0910 03/13/24 0325 03/13/24 1143 03/14/24 0131  WBC 12.1*  --  9.2  --   --   --   --   NEUTROABS  --   --  5.3  --   --   --   --   HGB 7.2*   < > 8.4* 8.8* 7.9* 8.8* 8.2*  HCT 21.7*   < > 25.1* 26.1* 23.7* 26.9* 24.6*  MCV 96.4  --  91.6  --   --   --   --   PLT 230  --  219  --   --   --   --    < > = values in this  interval not displayed.   Basic Metabolic Panel: Recent Labs  Lab 03/10/24 0239 03/11/24 0813 03/14/24 0131  NA 144 140 141  K 4.0 4.3 4.7  CL 120* 111 109  CO2 19* 20* 23  GLUCOSE 101* 128* 161*  BUN 63* 37* 29*  CREATININE 1.38* 1.26* 1.51*  CALCIUM 8.5* 9.1 8.9   Liver Function Tests: No results for input(s): AST, ALT, ALKPHOS, BILITOT, PROT, ALBUMIN  in the last 168 hours. CBG: Recent Labs  Lab 03/13/24 2046 03/14/24 0025 03/14/24 0424 03/14/24 0755 03/14/24 1140  GLUCAP 184* 160* 154* 146* 231*    Discharge time spent: 43 minutes.  Signed: Deliliah Room, MD [facility] 03/14/2024

## 2024-03-14 NOTE — Progress Notes (Addendum)
 PROGRESS NOTE    Paula Hamilton  FMW:[id number] DOB: 12-21-1950 DOA: 03/09/2024 PCP: [name], MD   Brief Narrative:   73 year old female with history of recent MI on dual antiplatelet therapy since 12/20/2023, hypertension, chronic back pain, diabetes mellitus is admitted for evaluation of dizziness/episode of hematemesis and acute blood loss anemia. EGD per GI 9/6: Nonbleeding cratered gastric ulcer noted which was clipped. DAPT restarted on 9/9. She did have melena on the night of 9/9 so aspirjn was stopped but plavix  was continued as per GI. GI on board.  Assessment & Plan:  Principal Problem:   GI bleeding Active Problems:   Hematemesis with nausea   ABLA (acute blood loss anemia)   Gastric ulcer with hemorrhage    73 year old female with history of recent MI on dual antiplatelet therapy since 12/20/2023, hypertension, chronic back pain, diabetes mellitus is admitted for evaluation of dizziness/episode of hematemesis and acute blood loss anemia.   Upper GI bleeding/ABL anemia,POA: -She was on dual antiplatelet therapy for recent MI 12/20/2023. - EGD per GI 9/6: Nonbleeding cratered gastric ulcer noted which was clipped.  Likely source of bleeding. - Restareted DAPT on 9/9 but she had an episode of melena with a drop in Hb so aspirin  dced on 9/10 but plavix  will be continued.  -GI on board -Continue with PPi     AKI: Creatinine 1.26 with baseline around 0.9. Continue to hold lisinopril , hydrochlorothiazide    Dizziness: Likely secondary to above. Continue with PT    History of MI s/p stent 12/20/2023: Holding Aspirin  in the setting of GI bleed and ABL anemia. Continue plavix  (discussed with GI on 9/10)   Diabetes mellitus type 2: insulin  sliding scale insulin  coverage Continue lantus  to 15 u daily.  Patient takes 40 units of Lantus  daily.    DVT prophylaxis: SCDs Start: 03/09/24 1735     Code Status: Full Code Family Communication:   Status is:  Inpatient Remains inpatient appropriate because: ABL anemia, GI Bleed    Subjective:  She did have an episode of melena last night followed by diarrhea. She feels tired.  Examination:  General exam: Appears calm and comfortable  Respiratory system: Clear to auscultation. Respiratory effort normal. Cardiovascular system: S1 & S2 heard, RRR. No JVD, murmurs, rubs, gallops or clicks. No pedal edema. Gastrointestinal system: Abdomen is nondistended, soft and nontender. No organomegaly or masses felt. Normal bowel sounds heard. Central nervous system: Alert and oriented. No focal neurological deficits. Extremities: Symmetric 5 x 5 power. Skin: No rashes, lesions or ulcers Psychiatry: Judgement and insight appear normal. Mood & affect appropriate.     Diet Orders (From admission, onward)     Start     Ordered   03/12/24 0944  DIET SOFT Room service appropriate? Yes; Fluid consistency: Thin  Diet effective now       Question Answer Comment  Room service appropriate? Yes   Fluid consistency: Thin      03/12/24 0943            Objective: Vitals:   03/13/24 2044 03/14/24 0027 03/14/24 0423 03/14/24 0756  BP: (!) 115/45 (!) 118/49 (!) 118/47 (!) 127/54  Pulse: 71 71 68 66  Resp: 20 20 20    Temp: 98.2 F (36.8 C) 97.9 F (36.6 C) 97.9 F (36.6 C) 97.9 F (36.6 C)  TempSrc: Oral Oral Oral   SpO2: 97% 99% 100% 95%  Weight:      Height:        Intake/Output  Summary (Last 24 hours) at 03/14/2024 0950 Last data filed at 03/14/2024 0600 Gross per 24 hour  Intake 250 ml  Output 950 ml  Net -700 ml   Filed Weights   03/12/24 2134  Weight: 65 kg    Scheduled Meds:  sodium chloride    Intravenous Once   acetaminophen   650 mg Oral Q6H   empagliflozin   10 mg Oral Daily   ezetimibe   10 mg Oral Daily   feeding supplement  237 mL Oral BID BM   insulin  aspart  0-9 Units Subcutaneous TID AC & HS   insulin  glargine  15 Units Subcutaneous Daily   levETIRAcetam   500 mg Oral  BID   metoprolol  succinate  25 mg Oral Daily   nicotine   7 mg Transdermal Daily   pantoprazole   40 mg Oral BID AC   sodium chloride  flush  10-40 mL Intracatheter Q12H   sucralfate   1 g Oral TID WC & HS   Continuous Infusions:  Nutritional status     Body mass index is 24.6 kg/m.  Data Reviewed:   CBC: Recent Labs  Lab 03/10/24 0239 03/10/24 2019 03/11/24 0951 03/12/24 0910 03/13/24 0325 03/13/24 1143 03/14/24 0131  WBC 12.1*  --  9.2  --   --   --   --   NEUTROABS  --   --  5.3  --   --   --   --   HGB 7.2*   < > 8.4* 8.8* 7.9* 8.8* 8.2*  HCT 21.7*   < > 25.1* 26.1* 23.7* 26.9* 24.6*  MCV 96.4  --  91.6  --   --   --   --   PLT 230  --  219  --   --   --   --    < > = values in this interval not displayed.   Basic Metabolic Panel: Recent Labs  Lab 03/10/24 0239 03/11/24 0813 03/14/24 0131  NA 144 140 141  K 4.0 4.3 4.7  CL 120* 111 109  CO2 19* 20* 23  GLUCOSE 101* 128* 161*  BUN 63* 37* 29*  CREATININE 1.38* 1.26* 1.51*  CALCIUM 8.5* 9.1 8.9   GFR: Estimated Creatinine Clearance: 29.5 mL/min (A) (by C-G formula based on SCr of 1.51 mg/dL (H)). Liver Function Tests: No results for input(s): AST, ALT, ALKPHOS, BILITOT, PROT, ALBUMIN  in the last 168 hours. No results for input(s): LIPASE, AMYLASE in the last 168 hours. No results for input(s): AMMONIA in the last 168 hours. Coagulation Profile: Recent Labs  Lab 03/09/24 1759  INR 1.1   Cardiac Enzymes: No results for input(s): CKTOTAL, CKMB, CKMBINDEX, TROPONINI in the last 168 hours. BNP (last 3 results) No results for input(s): PROBNP in the last 8760 hours. HbA1C: No results for input(s): HGBA1C in the last 72 hours. CBG: Recent Labs  Lab 03/13/24 1637 03/13/24 2046 03/14/24 0025 03/14/24 0424 03/14/24 0755  GLUCAP 229* 184* 160* 154* 146*   Lipid Profile: No results for input(s): CHOL, HDL, LDLCALC, TRIG, CHOLHDL, LDLDIRECT in the last 72  hours. Thyroid  Function Tests: No results for input(s): TSH, T4TOTAL, FREET4, T3FREE, THYROIDAB in the last 72 hours. Anemia Panel: No results for input(s): VITAMINB12, FOLATE, FERRITIN, TIBC, IRON, RETICCTPCT in the last 72 hours. Sepsis Labs: No results for input(s): PROCALCITON, LATICACIDVEN in the last 168 hours.  No results found for this or any previous visit (from the past 240 hours).       Radiology Studies: No results found.  LOS: 5 days   Time spent= 42 mins    Deliliah Room, MD [facility]  If 7PM-7AM, please contact night-coverage  03/14/2024, 9:50 AM

## 2024-03-14 NOTE — Progress Notes (Signed)
 Mobility Specialist: Progress Note   03/14/24 0800  Mobility  Activity Ambulated with assistance  Level of Assistance Standby assist, set-up cues, supervision of patient - no hands on  Assistive Device Front wheel walker  Distance Ambulated (ft) 250 ft  Activity Response Tolerated well  Mobility Referral Yes  Mobility visit 1 Mobility  Mobility Specialist Start Time (ACUTE ONLY) [id number]  Mobility Specialist Stop Time (ACUTE ONLY) 0909  Mobility Specialist Time Calculation (min) (ACUTE ONLY) 13 min    Pt received in bed, agreeable to mobility session. SV throughout. C/o low back pain and fatigued. Returned to room without fault. Left in bed with all needs met, call bell in reach.   Ileana Lute Mobility Specialist Please contact via SecureChat or Rehab office at [phone]

## 2024-03-15 ENCOUNTER — Ambulatory Visit (HOSPITAL_BASED_OUTPATIENT_CLINIC_OR_DEPARTMENT_OTHER): Admitting: Family Medicine

## 2024-03-15 ENCOUNTER — Telehealth: Payer: Self-pay

## 2024-03-15 ENCOUNTER — Telehealth (HOSPITAL_BASED_OUTPATIENT_CLINIC_OR_DEPARTMENT_OTHER): Payer: Self-pay | Admitting: Family Medicine

## 2024-03-15 DIAGNOSIS — E7800 Pure hypercholesterolemia, unspecified: Secondary | ICD-10-CM | POA: Diagnosis not present

## 2024-03-15 DIAGNOSIS — K5900 Constipation, unspecified: Secondary | ICD-10-CM | POA: Diagnosis not present

## 2024-03-15 DIAGNOSIS — E11319 Type 2 diabetes mellitus with unspecified diabetic retinopathy without macular edema: Secondary | ICD-10-CM | POA: Diagnosis not present

## 2024-03-15 DIAGNOSIS — E1122 Type 2 diabetes mellitus with diabetic chronic kidney disease: Secondary | ICD-10-CM | POA: Diagnosis not present

## 2024-03-15 DIAGNOSIS — M810 Age-related osteoporosis without current pathological fracture: Secondary | ICD-10-CM | POA: Diagnosis not present

## 2024-03-15 DIAGNOSIS — I129 Hypertensive chronic kidney disease with stage 1 through stage 4 chronic kidney disease, or unspecified chronic kidney disease: Secondary | ICD-10-CM | POA: Diagnosis not present

## 2024-03-15 DIAGNOSIS — J4489 Other specified chronic obstructive pulmonary disease: Secondary | ICD-10-CM | POA: Diagnosis not present

## 2024-03-15 DIAGNOSIS — I252 Old myocardial infarction: Secondary | ICD-10-CM | POA: Diagnosis not present

## 2024-03-15 DIAGNOSIS — M48061 Spinal stenosis, lumbar region without neurogenic claudication: Secondary | ICD-10-CM | POA: Diagnosis not present

## 2024-03-15 DIAGNOSIS — Z604 Social exclusion and rejection: Secondary | ICD-10-CM | POA: Diagnosis not present

## 2024-03-15 DIAGNOSIS — G8929 Other chronic pain: Secondary | ICD-10-CM | POA: Diagnosis not present

## 2024-03-15 DIAGNOSIS — E1142 Type 2 diabetes mellitus with diabetic polyneuropathy: Secondary | ICD-10-CM | POA: Diagnosis not present

## 2024-03-15 DIAGNOSIS — E1151 Type 2 diabetes mellitus with diabetic peripheral angiopathy without gangrene: Secondary | ICD-10-CM | POA: Diagnosis not present

## 2024-03-15 DIAGNOSIS — I2510 Atherosclerotic heart disease of native coronary artery without angina pectoris: Secondary | ICD-10-CM | POA: Diagnosis not present

## 2024-03-15 DIAGNOSIS — Z794 Long term (current) use of insulin: Secondary | ICD-10-CM | POA: Diagnosis not present

## 2024-03-15 DIAGNOSIS — Z8673 Personal history of transient ischemic attack (TIA), and cerebral infarction without residual deficits: Secondary | ICD-10-CM | POA: Diagnosis not present

## 2024-03-15 DIAGNOSIS — Z7984 Long term (current) use of oral hypoglycemic drugs: Secondary | ICD-10-CM | POA: Diagnosis not present

## 2024-03-15 DIAGNOSIS — D62 Acute posthemorrhagic anemia: Secondary | ICD-10-CM | POA: Diagnosis not present

## 2024-03-15 DIAGNOSIS — N1832 Chronic kidney disease, stage 3b: Secondary | ICD-10-CM | POA: Diagnosis not present

## 2024-03-15 DIAGNOSIS — K254 Chronic or unspecified gastric ulcer with hemorrhage: Secondary | ICD-10-CM | POA: Diagnosis not present

## 2024-03-15 DIAGNOSIS — Z955 Presence of coronary angioplasty implant and graft: Secondary | ICD-10-CM | POA: Diagnosis not present

## 2024-03-15 NOTE — Transitions of Care (Post Inpatient/ED Visit) (Signed)
 03/15/2024  Name: Paula Hamilton MRN: [id number] DOB: [birth year]  Today's TOC FU Call Status: Today's TOC FU Call Status:: Successful TOC FU Call Completed TOC FU Call Complete Date: 03/15/24 Patient's Name and Date of Birth confirmed.  Transition Care Management Follow-up Telephone Call Date of Discharge: 03/14/24 Discharge Facility: [facility]) Type of Discharge: Inpatient Admission Primary Inpatient Discharge Diagnosis:: GI bleed How have you been since you were released from the hospital?: Better Any questions or concerns?: No  Items Reviewed: Did you receive and understand the discharge instructions provided?: Yes Medications obtained,verified, and reconciled?: Yes (Medications Reviewed) Any new allergies since your discharge?: No Dietary orders reviewed?: Yes Do you have support at home?: Yes People in Home [RPT]: spouse  Medications Reviewed Today: Medications Reviewed Today     Reviewed by Emmitt Pan, LPN (Licensed Practical Nurse) on 03/15/24 at [phone]  Med List Status: <None>   Medication Order Taking? Sig Documenting Provider Last Dose Status Informant  aspirin  EC 81 MG tablet [id number] Yes Take 1 tablet (81 mg total) by mouth daily. Swallow whole. Henry Manuelita NOVAK, NP  Active Self, Multiple Informants  Bempedoic Acid-Ezetimibe  (NEXLIZET ) 180-10 MG TABS [id number]  Take 1 tablet by mouth daily.  Patient not taking: Reported on 03/15/2024   Carlin Delon BROCKS, NP  Active Self, Multiple Informants  Blood Glucose Monitoring Suppl DEVI [id number] Yes 1 each by Does not apply route as needed. May substitute to any manufacturer covered by patient's insurance. [name], MD  Active Self, Multiple Informants  diphenoxylate -atropine  (LOMOTIL ) 2.5-0.025 MG tablet [id number] Yes Take 1 tablet by mouth 3 (three) times daily as needed for diarrhea or loose stools. Charlanne Groom, MD  Active Self, Multiple Informants  DULoxetine  (CYMBALTA ) 30 MG capsule [id number]   Take 1 capsule (30 mg total) by mouth daily.  Patient not taking: Reported on 03/15/2024   [name], MD  Active Self, Multiple Informants  empagliflozin  (JARDIANCE ) 10 MG TABS tablet [id number] Yes Take 1 tablet (10 mg total) by mouth daily before breakfast. Henry Manuelita NOVAK, NP  Active Self, Multiple Informants  ezetimibe  (ZETIA ) 10 MG tablet [id number] Yes Take 10 mg by mouth daily. [provider]  Active Self, Multiple Informants  hydrochlorothiazide  (MICROZIDE ) 12.5 MG capsule [id number] Yes Take 12.5 mg by mouth daily. [provider]  Active Self, Multiple Informants           Med Note (SATTERFIELD, DARIUS E   Fri Mar 09, 2024  9:49 PM) ON HOLD per patient   insulin  glargine (LANTUS  SOLOSTAR) 100 UNIT/ML Solostar Pen [id number] Yes Inject 40 Units into the skin in the morning. [provider]  Active Self, Multiple Informants  levETIRAcetam  (KEPPRA ) 500 MG tablet [id number] Yes Take 1 tablet (500 mg total) by mouth 2 (two) times daily. Skeet Juliene SAUNDERS, DO  Active Self, Multiple Informants  lisinopril  (ZESTRIL ) 20 MG tablet [id number] Yes Take 20 mg by mouth in the morning. [provider]  Active Self, Multiple Informants           Med Note (SATTERFIELD, DARIUS E   Fri Mar 09, 2024  9:47 PM) ON HOLD per patient   metoprolol  succinate (TOPROL -XL) 25 MG 24 hr tablet [id number] Yes Take 1 tablet (25 mg total) by mouth daily. Henry Manuelita NOVAK, NP  Active Self, Multiple Informants           Med Note (SATTERFIELD, DARIUS E   Fri Mar 09, 2024  9:48 PM) ON HOLD  per patient   nitroGLYCERIN  (NITROSTAT ) 0.4 MG SL tablet [id number] Yes Place 1 tablet (0.4 mg total) under the tongue every 5 (five) minutes as needed.  Patient taking differently: Place 0.4 mg under the tongue every 5 (five) minutes as needed for chest pain.   Henry Manuelita NOVAK, NP  Active Self, Multiple Informants  pantoprazole  (PROTONIX ) 40 MG tablet [id number] Yes Take 1 tablet (40 mg total) by  mouth 2 (two) times daily before a meal. Dino Antu, MD  Active             Home Care and Equipment/Supplies: Were Home Health Services Ordered?: Yes Name of Home Health Agency:: unknown Has Agency set up a time to come to your home?: No Any new equipment or medical supplies ordered?: NA  Functional Questionnaire: Do you need assistance with bathing/showering or dressing?: Yes Do you need assistance with meal preparation?: Yes Do you need assistance with eating?: No Do you have difficulty maintaining continence: No Do you need assistance with getting out of bed/getting out of a chair/moving?: Yes Do you have difficulty managing or taking your medications?: No  Follow up appointments reviewed: PCP Follow-up appointment confirmed?: Yes Date of PCP follow-up appointment?: 03/16/24 Follow-up Provider: [facility] Follow-up appointment confirmed?: No Reason Specialist Follow-Up Not Confirmed: Patient has Specialist Provider Number and will Call for Appointment Do you need transportation to your follow-up appointment?: No Do you understand care options if your condition(s) worsen?: Yes-patient verbalized understanding    SIGNATURE Julian Lemmings, LPN [facility] Nurse Health Advisor Direct Dial [phone]

## 2024-03-15 NOTE — Telephone Encounter (Signed)
 Copied from CRM [id number]. Topic: Clinical - Home Health Verbal Orders >> Mar 15, 2024 11:54 AM Leonette SQUIBB wrote: Caller/Agency: [facility] Callback Number: [phone] Service Requested: Physical Therapy Frequency: 1 week 9  Any new concerns about the patient? No

## 2024-03-16 ENCOUNTER — Encounter (HOSPITAL_BASED_OUTPATIENT_CLINIC_OR_DEPARTMENT_OTHER): Payer: Self-pay | Admitting: Family Medicine

## 2024-03-16 ENCOUNTER — Ambulatory Visit (INDEPENDENT_AMBULATORY_CARE_PROVIDER_SITE_OTHER): Admitting: Family Medicine

## 2024-03-16 VITALS — BP 130/71 | HR 108 | Temp 98.4°F | Resp 16 | Wt 135.4 lb

## 2024-03-16 DIAGNOSIS — D649 Anemia, unspecified: Secondary | ICD-10-CM | POA: Diagnosis not present

## 2024-03-16 DIAGNOSIS — K254 Chronic or unspecified gastric ulcer with hemorrhage: Secondary | ICD-10-CM

## 2024-03-16 DIAGNOSIS — G894 Chronic pain syndrome: Secondary | ICD-10-CM

## 2024-03-16 DIAGNOSIS — D510 Vitamin B12 deficiency anemia due to intrinsic factor deficiency: Secondary | ICD-10-CM | POA: Diagnosis not present

## 2024-03-16 DIAGNOSIS — E1165 Type 2 diabetes mellitus with hyperglycemia: Secondary | ICD-10-CM

## 2024-03-16 LAB — CBC WITH DIFFERENTIAL/PLATELET
Basophils Absolute: 0 x10E3/uL (ref 0.0–0.2)
Basos: 0 %
EOS (ABSOLUTE): 0.2 x10E3/uL (ref 0.0–0.4)
Eos: 2 %
Hematocrit: 30.4 % — ABNORMAL LOW (ref 34.0–46.6)
Hemoglobin: 9.9 g/dL — ABNORMAL LOW (ref 11.1–15.9)
Immature Grans (Abs): 0.1 x10E3/uL (ref 0.0–0.1)
Immature Granulocytes: 1 %
Lymphocytes Absolute: 2.7 x10E3/uL (ref 0.7–3.1)
Lymphs: 27 %
MCH: 31.7 pg (ref 26.6–33.0)
MCHC: 32.6 g/dL (ref 31.5–35.7)
MCV: 97 fL (ref 79–97)
Monocytes Absolute: 0.7 x10E3/uL (ref 0.1–0.9)
Monocytes: 7 %
Neutrophils Absolute: 6.2 x10E3/uL (ref 1.4–7.0)
Neutrophils: 63 %
Platelets: 456 x10E3/uL — ABNORMAL HIGH (ref 150–450)
RBC: 3.12 x10E6/uL — ABNORMAL LOW (ref 3.77–5.28)
RDW: 16.8 % — ABNORMAL HIGH (ref 11.7–15.4)
WBC: 9.9 x10E3/uL (ref 3.4–10.8)

## 2024-03-16 MED ORDER — CYANOCOBALAMIN 1000 MCG/ML IJ SOLN
1000.0000 ug | Freq: Once | INTRAMUSCULAR | Status: AC
  Administered 2024-03-16: 1000 ug via INTRAMUSCULAR

## 2024-03-16 NOTE — Progress Notes (Signed)
 Established Patient Office Visit  Subjective   Patient ID: Paula Hamilton, female    DOB: Jan 16, 1951  Age: 73 y.o. MRN: [id number]  Chief Complaint  Patient presents with   Hospitalization Follow-up    Hospital f/u    F/u as above.  Please see last note for details.  TOC today.  Recently at [facility], and then moved to [facility] after Syncope and some Hematemesis.  Recent EGD noted.  She is slowly improving after PUD noted.  Needs recheck of her anemia today.  She may only resume her Plavix  after her anemia has been deemed stable.  BP has been controlled.    Past Medical History:  Diagnosis Date   Anxiety    Asthma    CAD (coronary artery disease)    with previous MI, f/by [facility]   Chronic pain    due to OA and Neuropathy f/by Dr. Joshua of Neurosurgery   Controlled type 2 diabetes mellitus with neuropathy (HCC)    COPD (chronic obstructive pulmonary disease) (HCC)    Depression    Diabetic retinopathy (HCC)    Gastric ulcer with hemorrhage    2025   GERD (gastroesophageal reflux disease)    Hx of migraines    Hypercholesterolemia    Hypertension    Lung nodule    2025, with serial CT scans advised   Osteoporosis    Pneumonia    x 1 yrs ago   Seizures (HCC) 09/2018   w/stroke in 2020   Stroke (HCC) 09/15/2018   multiple, f/by [facility]   Tobacco abuse     Outpatient Encounter Medications as of 03/16/2024  Medication Sig   aspirin  EC 81 MG tablet Take 1 tablet (81 mg total) by mouth daily. Swallow whole.   Blood Glucose Monitoring Suppl DEVI 1 each by Does not apply route as needed. May substitute to any manufacturer covered by patient's insurance.   diphenoxylate -atropine  (LOMOTIL ) 2.5-0.025 MG tablet Take 1 tablet by mouth 3 (three) times daily as needed for diarrhea or loose stools.   empagliflozin  (JARDIANCE ) 10 MG TABS tablet Take 1 tablet (10 mg total) by mouth daily before breakfast.   ezetimibe  (ZETIA ) 10 MG tablet Take 10 mg by mouth daily.    hydrochlorothiazide  (MICROZIDE ) 12.5 MG capsule Take 12.5 mg by mouth daily.   insulin  glargine (LANTUS  SOLOSTAR) 100 UNIT/ML Solostar Pen Inject 40 Units into the skin in the morning.   levETIRAcetam  (KEPPRA ) 500 MG tablet Take 1 tablet (500 mg total) by mouth 2 (two) times daily.   lisinopril  (ZESTRIL ) 20 MG tablet Take 20 mg by mouth in the morning.   metoprolol  succinate (TOPROL -XL) 25 MG 24 hr tablet Take 1 tablet (25 mg total) by mouth daily.   pantoprazole  (PROTONIX ) 40 MG tablet Take 1 tablet (40 mg total) by mouth 2 (two) times daily before a meal.   Bempedoic Acid-Ezetimibe  (NEXLIZET ) 180-10 MG TABS Take 1 tablet by mouth daily. (Patient not taking: Reported on 03/15/2024)   DULoxetine  (CYMBALTA ) 30 MG capsule Take 1 capsule (30 mg total) by mouth daily. (Patient not taking: Reported on 03/15/2024)   nitroGLYCERIN  (NITROSTAT ) 0.4 MG SL tablet Place 1 tablet (0.4 mg total) under the tongue every 5 (five) minutes as needed. (Patient taking differently: Place 0.4 mg under the tongue every 5 (five) minutes as needed for chest pain.)   [EXPIRED] cyanocobalamin  (VITAMIN B12) injection 1,000 mcg    No facility-administered encounter medications on file as of 03/16/2024.    Social History  Tobacco Use   Smoking status: Every Day    Current packs/day: 0.00    Average packs/day: 1 pack/day for 30.0 years (30.0 ttl pk-yrs)    Types: Cigarettes    Start date: 05/21/1981    Last attempt to quit: 05/22/2011    Years since quitting: 12.8   Smokeless tobacco: Never   Tobacco comments:    Started smoking again in back in 2012 per patient on 04/29/23.  Vaping Use   Vaping status: Never Used  Substance Use Topics   Alcohol  use: Not Currently    Comment: occasional beer   Drug use: No      Review of Systems  Constitutional:  Negative for diaphoresis, fever, malaise/fatigue and weight loss.  Respiratory:  Negative for cough, shortness of breath and wheezing.   Cardiovascular:  Negative for  chest pain, palpitations, orthopnea, claudication, leg swelling and PND.      Objective:     BP 130/71 (BP Location: Right Arm, Patient Position: Sitting, Cuff Size: Normal)   Pulse (!) 108   Temp 98.4 F (36.9 C) (Oral)   Resp 16   Wt 135 lb 6.4 oz (61.4 kg)   BMI 23.24 kg/m    Physical Exam Constitutional:      General: She is not in acute distress.    Appearance: Normal appearance.     Comments: Stiff gait and prefers a wheelchair  HENT:     Head: Normocephalic.  Neck:     Vascular: No carotid bruit.  Cardiovascular:     Rate and Rhythm: Normal rate and regular rhythm.     Pulses: Normal pulses.     Comments: 2/6 SEM Pulmonary:     Effort: Pulmonary effort is normal.     Breath sounds: Normal breath sounds.  Abdominal:     General: Bowel sounds are normal.     Palpations: Abdomen is soft.  Musculoskeletal:     Cervical back: Neck supple. No tenderness.     Right lower leg: No edema.     Left lower leg: No edema.  Neurological:     Mental Status: She is alert.      Results for orders placed or performed in visit on 03/16/24  CBC with Differential/Platelet  Result Value Ref Range   WBC 9.9 3.4 - 10.8 x10E3/uL   RBC 3.12 (L) 3.77 - 5.28 x10E6/uL   Hemoglobin 9.9 (L) 11.1 - 15.9 g/dL   Hematocrit 69.5 (L) 65.9 - 46.6 %   MCV 97 79 - 97 fL   MCH 31.7 26.6 - 33.0 pg   MCHC 32.6 31.5 - 35.7 g/dL   RDW 83.1 (H) 88.2 - 84.5 %   Platelets 456 (H) 150 - 450 x10E3/uL   Neutrophils 63 Not Estab. %   Lymphs 27 Not Estab. %   Monocytes 7 Not Estab. %   Eos 2 Not Estab. %   Basos 0 Not Estab. %   Neutrophils Absolute 6.2 1.4 - 7.0 x10E3/uL   Lymphocytes Absolute 2.7 0.7 - 3.1 x10E3/uL   Monocytes Absolute 0.7 0.1 - 0.9 x10E3/uL   EOS (ABSOLUTE) 0.2 0.0 - 0.4 x10E3/uL   Basophils Absolute 0.0 0.0 - 0.2 x10E3/uL   Immature Granulocytes 1 Not Estab. %   Immature Grans (Abs) 0.1 0.0 - 0.1 x10E3/uL      The ASCVD Risk score (Arnett DK, et al., 2019) failed to  calculate for the following reasons:   Risk score cannot be calculated because patient has a medical history suggesting  prior/existing ASCVD    Assessment & Plan:  Gastric ulcer with hemorrhage, unspecified chronicity Assessment & Plan: Clinically stable.  F/u with GI and await Hg/Hct.  Patient is unsure of her meds today and I'll have nursing reach out soon to firm up her list.   Pernicious anemia -     Cyanocobalamin   Chronic pain syndrome Assessment & Plan: Stable.  F/u with specialist as directed.   Anemia, unspecified type Assessment & Plan: She has many issues today and will arrange for close follow up.  Orders: -     CBC with Differential/Platelet    Return in about 1 week (around 03/23/2024) for chronic follow-up.    REDDING PONCE NORLEEN FALCON., MD

## 2024-03-16 NOTE — Progress Notes (Signed)
 Patient is in office today for a nurse visit for B12 Injection. Patient Injection was given in the  Right deltoid. Patient tolerated injection well.

## 2024-03-19 ENCOUNTER — Encounter (HOSPITAL_BASED_OUTPATIENT_CLINIC_OR_DEPARTMENT_OTHER): Payer: Self-pay | Admitting: Family Medicine

## 2024-03-19 ENCOUNTER — Ambulatory Visit (HOSPITAL_BASED_OUTPATIENT_CLINIC_OR_DEPARTMENT_OTHER): Payer: Self-pay | Admitting: Family Medicine

## 2024-03-19 NOTE — Assessment & Plan Note (Signed)
 Stable.  F/u with specialist as directed.

## 2024-03-19 NOTE — Assessment & Plan Note (Signed)
 She has many issues today and will arrange for close follow up.

## 2024-03-19 NOTE — Assessment & Plan Note (Addendum)
 Clinically stable.  F/u with GI and await Hg/Hct.  Patient is unsure of her meds today and I'll have nursing reach out soon to firm up her list.

## 2024-03-20 ENCOUNTER — Telehealth: Payer: Self-pay | Admitting: Pediatrics

## 2024-03-20 NOTE — Telephone Encounter (Signed)
 Returned call to patient. Someone picked up on first attempt but didn't speak, call was disconnected. Second attempt goes straight to vm. Left patient a detailed vm letting her know that the appt she has on 04/11/24 with Deanna May, NP is appropriate. Advised that patient's primary GI provider is Dr. Charlanne, not Dr. Suzann. Advised patient to call back if current appt does not work with her schedule.

## 2024-03-20 NOTE — Telephone Encounter (Signed)
 Inbound call from pt requesting to speak to the nurse in regards to when she need to schedule a follow up with Dr. Suzann due to her having a GI bleeding and Dr. Suzann doing an EGD on her at the hospital. Please advise.

## 2024-03-22 DIAGNOSIS — K5900 Constipation, unspecified: Secondary | ICD-10-CM | POA: Diagnosis not present

## 2024-03-22 DIAGNOSIS — M48061 Spinal stenosis, lumbar region without neurogenic claudication: Secondary | ICD-10-CM | POA: Diagnosis not present

## 2024-03-22 DIAGNOSIS — I129 Hypertensive chronic kidney disease with stage 1 through stage 4 chronic kidney disease, or unspecified chronic kidney disease: Secondary | ICD-10-CM | POA: Diagnosis not present

## 2024-03-22 DIAGNOSIS — Z604 Social exclusion and rejection: Secondary | ICD-10-CM | POA: Diagnosis not present

## 2024-03-22 DIAGNOSIS — D62 Acute posthemorrhagic anemia: Secondary | ICD-10-CM | POA: Diagnosis not present

## 2024-03-22 DIAGNOSIS — Z794 Long term (current) use of insulin: Secondary | ICD-10-CM | POA: Diagnosis not present

## 2024-03-22 DIAGNOSIS — I252 Old myocardial infarction: Secondary | ICD-10-CM | POA: Diagnosis not present

## 2024-03-22 DIAGNOSIS — Z7984 Long term (current) use of oral hypoglycemic drugs: Secondary | ICD-10-CM | POA: Diagnosis not present

## 2024-03-22 DIAGNOSIS — K254 Chronic or unspecified gastric ulcer with hemorrhage: Secondary | ICD-10-CM | POA: Diagnosis not present

## 2024-03-22 DIAGNOSIS — E11319 Type 2 diabetes mellitus with unspecified diabetic retinopathy without macular edema: Secondary | ICD-10-CM | POA: Diagnosis not present

## 2024-03-22 DIAGNOSIS — E1122 Type 2 diabetes mellitus with diabetic chronic kidney disease: Secondary | ICD-10-CM | POA: Diagnosis not present

## 2024-03-22 DIAGNOSIS — E7800 Pure hypercholesterolemia, unspecified: Secondary | ICD-10-CM | POA: Diagnosis not present

## 2024-03-22 DIAGNOSIS — E1151 Type 2 diabetes mellitus with diabetic peripheral angiopathy without gangrene: Secondary | ICD-10-CM | POA: Diagnosis not present

## 2024-03-22 DIAGNOSIS — E1142 Type 2 diabetes mellitus with diabetic polyneuropathy: Secondary | ICD-10-CM | POA: Diagnosis not present

## 2024-03-22 DIAGNOSIS — M810 Age-related osteoporosis without current pathological fracture: Secondary | ICD-10-CM | POA: Diagnosis not present

## 2024-03-22 DIAGNOSIS — N1832 Chronic kidney disease, stage 3b: Secondary | ICD-10-CM | POA: Diagnosis not present

## 2024-03-22 DIAGNOSIS — Z8673 Personal history of transient ischemic attack (TIA), and cerebral infarction without residual deficits: Secondary | ICD-10-CM | POA: Diagnosis not present

## 2024-03-22 DIAGNOSIS — J4489 Other specified chronic obstructive pulmonary disease: Secondary | ICD-10-CM | POA: Diagnosis not present

## 2024-03-22 DIAGNOSIS — I2510 Atherosclerotic heart disease of native coronary artery without angina pectoris: Secondary | ICD-10-CM | POA: Diagnosis not present

## 2024-03-22 DIAGNOSIS — G8929 Other chronic pain: Secondary | ICD-10-CM | POA: Diagnosis not present

## 2024-03-22 DIAGNOSIS — Z955 Presence of coronary angioplasty implant and graft: Secondary | ICD-10-CM | POA: Diagnosis not present

## 2024-03-23 ENCOUNTER — Ambulatory Visit (INDEPENDENT_AMBULATORY_CARE_PROVIDER_SITE_OTHER): Admitting: Family Medicine

## 2024-03-23 ENCOUNTER — Encounter (HOSPITAL_BASED_OUTPATIENT_CLINIC_OR_DEPARTMENT_OTHER): Payer: Self-pay | Admitting: Family Medicine

## 2024-03-23 VITALS — BP 79/46 | HR 90 | Temp 98.4°F | Resp 17 | Wt 137.1 lb

## 2024-03-23 DIAGNOSIS — D510 Vitamin B12 deficiency anemia due to intrinsic factor deficiency: Secondary | ICD-10-CM | POA: Diagnosis not present

## 2024-03-23 DIAGNOSIS — I951 Orthostatic hypotension: Secondary | ICD-10-CM | POA: Diagnosis not present

## 2024-03-23 DIAGNOSIS — K254 Chronic or unspecified gastric ulcer with hemorrhage: Secondary | ICD-10-CM

## 2024-03-23 DIAGNOSIS — D500 Iron deficiency anemia secondary to blood loss (chronic): Secondary | ICD-10-CM

## 2024-03-23 MED ORDER — CYANOCOBALAMIN 1000 MCG/ML IJ SOLN
1000.0000 ug | Freq: Once | INTRAMUSCULAR | Status: AC
  Administered 2024-03-23: 1000 ug via INTRAMUSCULAR

## 2024-03-23 NOTE — Progress Notes (Signed)
 Established Patient Office Visit  Subjective   Patient ID: Paula Hamilton, female    DOB: 02/02/1951  Age: 73 y.o. MRN: [id number]  Chief Complaint  Patient presents with   Follow-up    Follow-up     F/u as above.  Please see previous notes for details.  I'm pleased to hear that she is stronger and feeling better.  Still has mild, but improving Orthostasis.  A shame her BP remains low despite her reportedly remaining off of all her meds.  I reminded her to stay hydrated with water and sugar free Gatorade, especially on hot days.  Her sugars are nicely controlled at home and she was commended on this.  Her Hg was only 9 last week, and I advised her not to resume the Plavix  until her GI approves this move.  She appropriately has an upcoming appt with them and I again instructed my staff to forward them records.    Past Medical History:  Diagnosis Date   Anxiety    Asthma    CAD (coronary artery disease)    with previous MI, f/by [facility]   Chronic pain    due to OA and Neuropathy f/by Dr. Joshua of Neurosurgery   Controlled type 2 diabetes mellitus with neuropathy (HCC)    COPD (chronic obstructive pulmonary disease) (HCC)    Depression    Diabetic retinopathy (HCC)    Gastric ulcer with hemorrhage    2025   GERD (gastroesophageal reflux disease)    Hx of migraines    Hypercholesterolemia    Hypertension    Lung nodule    2025, with serial CT scans advised   Osteoporosis    Pneumonia    x 1 yrs ago   Seizures (HCC) 09/2018   w/stroke in 2020   Stroke (HCC) 09/15/2018   multiple, f/by [facility]   Tobacco abuse     Outpatient Encounter Medications as of 03/23/2024  Medication Sig   aspirin  EC 81 MG tablet Take 1 tablet (81 mg total) by mouth daily. Swallow whole.   Blood Glucose Monitoring Suppl DEVI 1 each by Does not apply route as needed. May substitute to any manufacturer covered by patient's insurance.   diphenoxylate -atropine  (LOMOTIL ) 2.5-0.025 MG  tablet Take 1 tablet by mouth 3 (three) times daily as needed for diarrhea or loose stools.   empagliflozin  (JARDIANCE ) 10 MG TABS tablet Take 1 tablet (10 mg total) by mouth daily before breakfast.   ezetimibe  (ZETIA ) 10 MG tablet Take 10 mg by mouth daily.   insulin  glargine (LANTUS  SOLOSTAR) 100 UNIT/ML Solostar Pen Inject 40 Units into the skin in the morning.   levETIRAcetam  (KEPPRA ) 500 MG tablet Take 1 tablet (500 mg total) by mouth 2 (two) times daily.   nitroGLYCERIN  (NITROSTAT ) 0.4 MG SL tablet Place 1 tablet (0.4 mg total) under the tongue every 5 (five) minutes as needed. (Patient taking differently: Place 0.4 mg under the tongue every 5 (five) minutes as needed for chest pain.)   pantoprazole  (PROTONIX ) 40 MG tablet Take 1 tablet (40 mg total) by mouth 2 (two) times daily before a meal.   [DISCONTINUED] hydrochlorothiazide  (MICROZIDE ) 12.5 MG capsule Take 12.5 mg by mouth daily.   [DISCONTINUED] lisinopril  (ZESTRIL ) 20 MG tablet Take 20 mg by mouth in the morning.   [DISCONTINUED] metoprolol  succinate (TOPROL -XL) 25 MG 24 hr tablet Take 1 tablet (25 mg total) by mouth daily.   Bempedoic Acid-Ezetimibe  (NEXLIZET ) 180-10 MG TABS Take 1 tablet by mouth  daily. (Patient not taking: Reported on 03/23/2024)   DULoxetine  (CYMBALTA ) 30 MG capsule Take 1 capsule (30 mg total) by mouth daily. (Patient not taking: Reported on 03/23/2024)   [EXPIRED] cyanocobalamin  (VITAMIN B12) injection 1,000 mcg    No facility-administered encounter medications on file as of 03/23/2024.    Social History   Tobacco Use   Smoking status: Every Day    Current packs/day: 0.00    Average packs/day: 1 pack/day for 30.0 years (30.0 ttl pk-yrs)    Types: Cigarettes    Start date: 05/21/1981    Last attempt to quit: 05/22/2011    Years since quitting: 12.8   Smokeless tobacco: Never   Tobacco comments:    Started smoking again in back in 2012 per patient on 04/29/23.  Vaping Use   Vaping status: Never Used   Substance Use Topics   Alcohol  use: Not Currently    Comment: occasional beer   Drug use: No      Review of Systems  Constitutional:  Positive for malaise/fatigue. Negative for diaphoresis, fever and weight loss.  Respiratory:  Negative for cough, shortness of breath and wheezing.   Cardiovascular:  Negative for chest pain, palpitations, orthopnea, claudication, leg swelling and PND.      Objective:     BP (!) 79/46   Pulse 90   Temp 98.4 F (36.9 C) (Oral)   Resp 17   Wt 137 lb 1.6 oz (62.2 kg)   SpO2 96%   BMI 23.53 kg/m    Physical Exam Constitutional:      General: She is not in acute distress.    Appearance: Normal appearance.     Comments: Mild dizziness when she stands.  Ambulates fairly well w/o assistance.  HENT:     Head: Normocephalic.  Neck:     Vascular: No carotid bruit.  Cardiovascular:     Rate and Rhythm: Normal rate and regular rhythm.     Pulses: Normal pulses.     Heart sounds: Normal heart sounds.  Pulmonary:     Effort: Pulmonary effort is normal.     Breath sounds: Normal breath sounds.  Abdominal:     General: Bowel sounds are normal.     Palpations: Abdomen is soft.  Musculoskeletal:     Cervical back: Neck supple. No tenderness.     Right lower leg: No edema.     Left lower leg: No edema.  Neurological:     Mental Status: She is alert.      No results found for any visits on 03/23/24.    The ASCVD Risk score (Arnett DK, et al., 2019) failed to calculate for the following reasons:   Risk score cannot be calculated because patient has a medical history suggesting prior/existing ASCVD    Assessment & Plan:  Gastric ulcer with hemorrhage, unspecified chronicity Assessment & Plan: Clinically improved.  Looks and feels better today.  Continue PPI and f/u with GI as directed.  Decision to resume Plavix  deferred to them.   Iron deficiency anemia due to chronic blood loss Assessment & Plan: Plan as above.   Pernicious  anemia -     Cyanocobalamin   Orthostasis Assessment & Plan: Remain off of Cardiac meds for now.  Obviously monitor carefully.  Discussed the importance and techniques of her staying hydrated.   I personally spent a total of 30 minutes in the care of the patient today including performing a medically appropriate exam/evaluation, counseling and educating, documenting clinical information in the EHR,  and communicating results.   Return in about 4 weeks (around 04/20/2024) for chronic follow-up.    REDDING PONCE NORLEEN FALCON., MD

## 2024-03-23 NOTE — Telephone Encounter (Signed)
 Inbound call from [facility] with med center primary care requesting f/u call at [phone] to discuss if patient can resume plavix . Also requesting provider to look at recent labs. Please advise.

## 2024-03-23 NOTE — Telephone Encounter (Signed)
 Patient was d/c home last week for GIB & was advised to follow up with PCP on 9/12 to discuss resuming plavix  if hemoglobin was stable. Most recent Hgb is 9.9 from 9/12. PCP office has called & result note from MD suggest our office review and decide if okay to resume medication. She has a follow up scheduled with us  in October with Cathryne, NP.

## 2024-03-23 NOTE — Progress Notes (Signed)
 Patient is in office today for a nurse visit for B12 Injection. Patient Injection was given in the  Right deltoid. Patient tolerated injection well.

## 2024-03-26 ENCOUNTER — Telehealth (HOSPITAL_BASED_OUTPATIENT_CLINIC_OR_DEPARTMENT_OTHER): Payer: Self-pay | Admitting: *Deleted

## 2024-03-26 ENCOUNTER — Other Ambulatory Visit (HOSPITAL_BASED_OUTPATIENT_CLINIC_OR_DEPARTMENT_OTHER): Payer: Self-pay | Admitting: Family Medicine

## 2024-03-26 DIAGNOSIS — D62 Acute posthemorrhagic anemia: Secondary | ICD-10-CM | POA: Diagnosis not present

## 2024-03-26 DIAGNOSIS — J4489 Other specified chronic obstructive pulmonary disease: Secondary | ICD-10-CM | POA: Diagnosis not present

## 2024-03-26 DIAGNOSIS — I252 Old myocardial infarction: Secondary | ICD-10-CM | POA: Diagnosis not present

## 2024-03-26 DIAGNOSIS — K254 Chronic or unspecified gastric ulcer with hemorrhage: Secondary | ICD-10-CM | POA: Diagnosis not present

## 2024-03-26 DIAGNOSIS — Z794 Long term (current) use of insulin: Secondary | ICD-10-CM | POA: Diagnosis not present

## 2024-03-26 DIAGNOSIS — I2510 Atherosclerotic heart disease of native coronary artery without angina pectoris: Secondary | ICD-10-CM | POA: Diagnosis not present

## 2024-03-26 DIAGNOSIS — N1832 Chronic kidney disease, stage 3b: Secondary | ICD-10-CM | POA: Diagnosis not present

## 2024-03-26 DIAGNOSIS — M810 Age-related osteoporosis without current pathological fracture: Secondary | ICD-10-CM | POA: Diagnosis not present

## 2024-03-26 DIAGNOSIS — E1142 Type 2 diabetes mellitus with diabetic polyneuropathy: Secondary | ICD-10-CM | POA: Diagnosis not present

## 2024-03-26 DIAGNOSIS — Z8673 Personal history of transient ischemic attack (TIA), and cerebral infarction without residual deficits: Secondary | ICD-10-CM | POA: Diagnosis not present

## 2024-03-26 DIAGNOSIS — E11319 Type 2 diabetes mellitus with unspecified diabetic retinopathy without macular edema: Secondary | ICD-10-CM | POA: Diagnosis not present

## 2024-03-26 DIAGNOSIS — E1151 Type 2 diabetes mellitus with diabetic peripheral angiopathy without gangrene: Secondary | ICD-10-CM | POA: Diagnosis not present

## 2024-03-26 DIAGNOSIS — E7800 Pure hypercholesterolemia, unspecified: Secondary | ICD-10-CM | POA: Diagnosis not present

## 2024-03-26 DIAGNOSIS — Z955 Presence of coronary angioplasty implant and graft: Secondary | ICD-10-CM | POA: Diagnosis not present

## 2024-03-26 DIAGNOSIS — G8929 Other chronic pain: Secondary | ICD-10-CM | POA: Diagnosis not present

## 2024-03-26 DIAGNOSIS — I129 Hypertensive chronic kidney disease with stage 1 through stage 4 chronic kidney disease, or unspecified chronic kidney disease: Secondary | ICD-10-CM | POA: Diagnosis not present

## 2024-03-26 DIAGNOSIS — D500 Iron deficiency anemia secondary to blood loss (chronic): Secondary | ICD-10-CM

## 2024-03-26 DIAGNOSIS — M48061 Spinal stenosis, lumbar region without neurogenic claudication: Secondary | ICD-10-CM | POA: Diagnosis not present

## 2024-03-26 DIAGNOSIS — Z7984 Long term (current) use of oral hypoglycemic drugs: Secondary | ICD-10-CM | POA: Diagnosis not present

## 2024-03-26 DIAGNOSIS — Z604 Social exclusion and rejection: Secondary | ICD-10-CM | POA: Diagnosis not present

## 2024-03-26 DIAGNOSIS — E1122 Type 2 diabetes mellitus with diabetic chronic kidney disease: Secondary | ICD-10-CM | POA: Diagnosis not present

## 2024-03-26 DIAGNOSIS — K5900 Constipation, unspecified: Secondary | ICD-10-CM | POA: Diagnosis not present

## 2024-03-26 NOTE — Assessment & Plan Note (Signed)
 Remain off of Cardiac meds for now.  Obviously monitor carefully.  Discussed the importance and techniques of her staying hydrated.

## 2024-03-26 NOTE — Assessment & Plan Note (Signed)
 Plan as above.

## 2024-03-26 NOTE — Assessment & Plan Note (Signed)
 Clinically improved.  Looks and feels better today.  Continue PPI and f/u with GI as directed.  Decision to resume Plavix  deferred to them.

## 2024-03-26 NOTE — Telephone Encounter (Signed)
 E mail sent

## 2024-03-27 ENCOUNTER — Ambulatory Visit (HOSPITAL_BASED_OUTPATIENT_CLINIC_OR_DEPARTMENT_OTHER): Admitting: *Deleted

## 2024-03-27 ENCOUNTER — Telehealth: Payer: Self-pay | Admitting: Cardiology

## 2024-03-27 DIAGNOSIS — D500 Iron deficiency anemia secondary to blood loss (chronic): Secondary | ICD-10-CM | POA: Diagnosis not present

## 2024-03-27 NOTE — Telephone Encounter (Signed)
 Shane, CMA called to make office aware that [name], MD will be faxing over recent office notes from the visit today. She also stated pt should be advised to resume medication since being out of the hospital. Please advise

## 2024-03-27 NOTE — Telephone Encounter (Signed)
 Spoke with Shane, CMA with Dr. Briant office & made her aware of PA recommendations.

## 2024-03-28 ENCOUNTER — Ambulatory Visit (HOSPITAL_BASED_OUTPATIENT_CLINIC_OR_DEPARTMENT_OTHER): Payer: Self-pay | Admitting: Family Medicine

## 2024-03-28 LAB — CBC WITH DIFFERENTIAL/PLATELET
Basophils Absolute: 0.1 x10E3/uL (ref 0.0–0.2)
Basos: 1 %
EOS (ABSOLUTE): 0.3 x10E3/uL (ref 0.0–0.4)
Eos: 3 %
Hematocrit: 37.2 % (ref 34.0–46.6)
Hemoglobin: 11.7 g/dL (ref 11.1–15.9)
Immature Grans (Abs): 0.1 x10E3/uL (ref 0.0–0.1)
Immature Granulocytes: 1 %
Lymphocytes Absolute: 3.4 x10E3/uL — ABNORMAL HIGH (ref 0.7–3.1)
Lymphs: 33 %
MCH: 32.3 pg (ref 26.6–33.0)
MCHC: 31.5 g/dL (ref 31.5–35.7)
MCV: 103 fL — ABNORMAL HIGH (ref 79–97)
Monocytes Absolute: 0.7 x10E3/uL (ref 0.1–0.9)
Monocytes: 6 %
Neutrophils Absolute: 5.9 x10E3/uL (ref 1.4–7.0)
Neutrophils: 56 %
Platelets: 475 x10E3/uL — ABNORMAL HIGH (ref 150–450)
RBC: 3.62 x10E6/uL — ABNORMAL LOW (ref 3.77–5.28)
RDW: 17.5 % — ABNORMAL HIGH (ref 11.7–15.4)
WBC: 10.4 x10E3/uL (ref 3.4–10.8)

## 2024-04-03 ENCOUNTER — Other Ambulatory Visit: Payer: Self-pay

## 2024-04-03 DIAGNOSIS — J189 Pneumonia, unspecified organism: Secondary | ICD-10-CM | POA: Insufficient documentation

## 2024-04-03 DIAGNOSIS — E1140 Type 2 diabetes mellitus with diabetic neuropathy, unspecified: Secondary | ICD-10-CM | POA: Insufficient documentation

## 2024-04-03 DIAGNOSIS — Z8669 Personal history of other diseases of the nervous system and sense organs: Secondary | ICD-10-CM | POA: Insufficient documentation

## 2024-04-03 DIAGNOSIS — J45909 Unspecified asthma, uncomplicated: Secondary | ICD-10-CM | POA: Insufficient documentation

## 2024-04-03 DIAGNOSIS — F32A Depression, unspecified: Secondary | ICD-10-CM | POA: Insufficient documentation

## 2024-04-03 DIAGNOSIS — E11319 Type 2 diabetes mellitus with unspecified diabetic retinopathy without macular edema: Secondary | ICD-10-CM | POA: Insufficient documentation

## 2024-04-03 DIAGNOSIS — K219 Gastro-esophageal reflux disease without esophagitis: Secondary | ICD-10-CM | POA: Insufficient documentation

## 2024-04-03 DIAGNOSIS — J449 Chronic obstructive pulmonary disease, unspecified: Secondary | ICD-10-CM | POA: Insufficient documentation

## 2024-04-03 DIAGNOSIS — R911 Solitary pulmonary nodule: Secondary | ICD-10-CM | POA: Insufficient documentation

## 2024-04-04 ENCOUNTER — Ambulatory Visit: Attending: Cardiology | Admitting: Cardiology

## 2024-04-04 ENCOUNTER — Encounter: Payer: Self-pay | Admitting: Cardiology

## 2024-04-04 VITALS — BP 172/102 | HR 96 | Ht 64.0 in | Wt 141.0 lb

## 2024-04-04 DIAGNOSIS — K264 Chronic or unspecified duodenal ulcer with hemorrhage: Secondary | ICD-10-CM

## 2024-04-04 DIAGNOSIS — E1151 Type 2 diabetes mellitus with diabetic peripheral angiopathy without gangrene: Secondary | ICD-10-CM | POA: Diagnosis not present

## 2024-04-04 DIAGNOSIS — I25118 Atherosclerotic heart disease of native coronary artery with other forms of angina pectoris: Secondary | ICD-10-CM | POA: Diagnosis not present

## 2024-04-04 MED ORDER — LISINOPRIL 2.5 MG PO TABS: 2.5000 mg | ORAL_TABLET | Freq: Every day | ORAL | 3 refills | Status: AC

## 2024-04-04 NOTE — Progress Notes (Unsigned)
 Cardiology Office Note:    Date:  04/04/2024   ID:  Paula Hamilton, DOB 1950-12-01, MRN [id number]  PCP:  [name], MD  Cardiologist:  Lamar Fitch, MD    Referring MD: [name], MD   No chief complaint on file. Doing better  History of Present Illness:    Paula Hamilton is a 73 y.o. female past medical history significant for coronary artery disease, in June she had up going to the hospital chest pain, cardiac catheterization has been done two-vessel disease is being identified PTCA and stent of mid LAD was implanted as well as PTCA and stent to obtuse branch with drug-eluting stent she did have multiple noncritical residual lesions, she was a chronic smoker however she reduced significantly amount of cigarettes additional problem clued essential hypertension.  Recently she had to go to the hospital because of episode of syncope, she was found to be severely anemic and apparently bleeding also has been identified to be catheterized since that time she is being put back on Plavix  only not on aspirin .  Comes today for follow-up doing better denies of any chest pain tightness squeezing pressure burning chest, a lot of medication has been withdrawn did cause hypotension.  Denies have any cardiac complaints no evidence of bleeding.  Past Medical History:  Diagnosis Date   Anxiety    Asthma    CAD (coronary artery disease)    with previous MI, f/by [facility]   Chronic pain    due to OA and Neuropathy f/by Dr. Joshua of Neuro[facility]   Controlled type 2 diabetes mellitus with neuropathy (HCC)    COPD (chronic obstructive pulmonary disease) (HCC)    Depression    Diabetic retinopathy (HCC)    Gastric ulcer with hemorrhage    2025   GERD (gastroesophageal reflux disease)    Hx of migraines    Hypercholesterolemia    Hypertension    Lung nodule    2025, with serial CT scans advised   Osteoporosis    Pneumonia    x 1 yrs ago   Seizures (HCC) 09/2018    w/stroke in 2020   Stroke (HCC) 09/15/2018   multiple, f/by [facility]   Tobacco abuse     Past Surgical History:  Procedure Laterality Date   ABDOMINAL HYSTERECTOMY     APPENDECTOMY     age 70   BACK SURGERY     lower back 4 time, tens surgery too   CERVICAL FUSION     COLONOSCOPY W/ BIOPSIES AND POLYPECTOMY N/A 11/21/2014   f/by Dr. Charlanne   CORONARY STENT INTERVENTION N/A 12/20/2023   Procedure: CORONARY STENT INTERVENTION;  Surgeon: Swaziland, Peter M, MD;  Location: [facility] INVASIVE CV LAB;  Service: Cardiovascular;  Laterality: N/A;   ESOPHAGOGASTRODUODENOSCOPY  02/26/2016   Moderate gastritis. Otherwise normal EGD    ESOPHAGOGASTRODUODENOSCOPY N/A 03/10/2024   Procedure: EGD (ESOPHAGOGASTRODUODENOSCOPY);  Surgeon: Suzann Inocente CHRISTELLA, MD;  Location: [facility] ENDOSCOPY;  Service: Gastroenterology;  Laterality: N/A;   EYE SURGERY     laser surgery on left eye 2023; cataracts removed bilateral 2023   HEMOSTASIS CLIP PLACEMENT  03/10/2024   Procedure: CONTROL OF HEMORRHAGE, GI TRACT, ENDOSCOPIC, BY CLIPPING OR OVERSEWING;  Surgeon: Suzann Inocente CHRISTELLA, MD;  Location: [facility] ENDOSCOPY;  Service: Gastroenterology;;   LEFT HEART CATH AND CORONARY ANGIOGRAPHY N/A 12/20/2023   Procedure: LEFT HEART CATH AND CORONARY ANGIOGRAPHY;  Surgeon: Swaziland, Peter M, MD;  Location: [facility] INVASIVE CV LAB;  Service:  Cardiovascular;  Laterality: N/A;   MAXIMUM ACCESS (MAS)POSTERIOR LUMBAR INTERBODY FUSION (PLIF) 1 LEVEL N/A 04/12/2013   Procedure: Lumba four-five maximum access surgery Posterior Lumbar Interbody Fusion;  Surgeon: Alm GORMAN Molt, MD;  Location: [facility] NEURO ORS;  Service: Neurosurgery;  Laterality: N/A;  Lumba four-five maximum access surgery Posterior Lumbar Interbody Fusion   SPINAL CORD STIMULATOR INSERTION N/A 06/14/2014   Procedure: LUMBAR SPINAL CORD STIMULATOR INSERTION;  Surgeon: Deward JAYSON Fabian, MD;  Location: [facility] NEURO ORS;  Service: Neurosurgery;  Laterality: N/A;   SPINAL CORD STIMULATOR REMOVAL N/A  04/12/2017   Procedure: LUMBAR SPINAL CORD STIMULATOR REMOVAL;  Surgeon: Fabian Deward, MD;  Location: [facility] OR;  Service: Neurosurgery;  Laterality: N/A;   TUBAL LIGATION  1984    Current Medications: Current Meds  Medication Sig   Bempedoic Acid-Ezetimibe  (NEXLIZET ) 180-10 MG TABS Take by mouth daily.   Blood Glucose Monitoring Suppl DEVI 1 each by Does not apply route as needed. May substitute to any manufacturer covered by patient's insurance.   diphenoxylate -atropine  (LOMOTIL ) 2.5-0.025 MG tablet Take 1 tablet by mouth 3 (three) times daily as needed for diarrhea or loose stools.   DULoxetine  (CYMBALTA ) 30 MG capsule Take 30 mg by mouth daily.   empagliflozin  (JARDIANCE ) 10 MG TABS tablet Take 1 tablet (10 mg total) by mouth daily before breakfast.   insulin  glargine (LANTUS  SOLOSTAR) 100 UNIT/ML Solostar Pen Inject 40 Units into the skin in the morning.   levETIRAcetam  (KEPPRA ) 500 MG tablet Take 1 tablet (500 mg total) by mouth 2 (two) times daily.   nitroGLYCERIN  (NITROSTAT ) 0.4 MG SL tablet Place 1 tablet (0.4 mg total) under the tongue every 5 (five) minutes as needed.   pantoprazole  (PROTONIX ) 40 MG tablet Take 1 tablet (40 mg total) by mouth 2 (two) times daily before a meal.   [DISCONTINUED] ezetimibe  (ZETIA ) 10 MG tablet Take 10 mg by mouth daily.     Allergies:   Codeine, Penicillins, Pregabalin , Atorvastatin, Doxycycline, Metformin, Motrin [ibuprofen], Erythromycin base, and Morphine  and codeine   Social History   Socioeconomic History   Marital status: Widowed    Spouse name: Not on file   Number of children: 2   Years of education: Not on file   Highest education level: Some college, no degree  Occupational History    Comment: retired   Occupation: retired  Tobacco Use   Smoking status: Every Day    Current packs/day: 0.00    Average packs/day: 1 pack/day for 30.0 years (30.0 ttl pk-yrs)    Types: Cigarettes    Start date: 05/21/1981    Last attempt to quit:  05/22/2011    Years since quitting: 12.8   Smokeless tobacco: Never   Tobacco comments:    Started smoking again in back in 2012 per patient on 04/29/23.  Vaping Use   Vaping status: Never Used  Substance and Sexual Activity   Alcohol  use: Not Currently    Comment: occasional beer   Drug use: No   Sexual activity: Yes    Birth control/protection: Surgical    Comment: Hysterectomy  Other Topics Concern   Not on file  Social History Narrative   Lives with sister, bro-in-law   Caffeine- not much   Social Drivers of Corporate investment banker Strain: Not on file  Food Insecurity: No Food Insecurity (03/09/2024)   Hunger Vital Sign    Worried About Running Out of Food in the Last Year: Never true    Ran Out of Food  in the Last Year: Never true  Transportation Needs: No Transportation Needs (03/09/2024)   PRAPARE - Administrator, Civil Service (Medical): No    Lack of Transportation (Non-Medical): No  Physical Activity: Not on file  Stress: Not on file  Social Connections: Socially Integrated (03/09/2024)   Social Connection and Isolation Panel    Frequency of Communication with Friends and Family: Twice a week    Frequency of Social Gatherings with Friends and Family: Twice a week    Attends Religious Services: 1 to 4 times per year    Active Member of [employer] or Organizations: No    Attends Engineer, structural: 1 to 4 times per year    Marital Status: Married     Family History: The patient's family history includes Breast cancer in her mother; COPD in her mother; Colon cancer in her paternal uncle; Diabetes in her brother, brother, and sister; Kidney cancer in her paternal uncle; Lung cancer in her mother; Melanoma in her father; Stomach cancer in her paternal aunt; Stroke in her brother and sister. There is no history of Esophageal cancer or Liver disease. ROS:   Please see the history of present illness.    All 14 point review of systems negative except as  described per history of present illness  EKGs/Labs/Other Studies Reviewed:         Recent Labs: 02/16/2024: TSH 0.582 02/23/2024: ALT 10 03/14/2024: BUN 29; Creatinine, Ser 1.51; Potassium 4.7; Sodium 141 03/27/2024: Hemoglobin 11.7; Platelets 475  Recent Lipid Panel    Component Value Date/Time   CHOL 129 02/22/2024 1536   TRIG 179 (H) 02/22/2024 1536   HDL 34 (L) 02/22/2024 1536   CHOLHDL 3.8 02/22/2024 1536   CHOLHDL 5.6 12/21/2023 0740   VLDL 32 12/21/2023 0740   LDLCALC 65 02/22/2024 1536   LDLCALC 69 09/07/2019 1213    Physical Exam:    VS:  BP (!) 172/102 (BP Location: Right Arm, Patient Position: Sitting)   Pulse 96   Ht 5' 4 (1.626 m)   Wt 141 lb (64 kg)   SpO2 99%   BMI 24.20 kg/m     Wt Readings from Last 3 Encounters:  04/04/24 141 lb (64 kg)  03/23/24 137 lb 1.6 oz (62.2 kg)  03/16/24 135 lb 6.4 oz (61.4 kg)     GEN:  Well nourished, well developed in no acute distress HEENT: Normal NECK: No JVD; No carotid bruits LYMPHATICS: No lymphadenopathy CARDIAC: RRR, no murmurs, no rubs, no gallops RESPIRATORY:  Clear to auscultation without rales, wheezing or rhonchi  ABDOMEN: Soft, non-tender, non-distended MUSCULOSKELETAL:  No edema; No deformity  SKIN: Warm and dry LOWER EXTREMITIES: no swelling NEUROLOGIC:  Alert and oriented x 3 PSYCHIATRIC:  Normal affect   ASSESSMENT:    1. Coronary artery disease of native artery of native heart with stable angina pectoris   2. Diabetes mellitus type 2 with peripheral artery disease (HCC)   3. Gastrointestinal hemorrhage associated with duodenal ulcer    PLAN:    In order of problems listed above:  Coronary disease stable from that point review only on monotherapy secondary to significant GI bleed that required blood transfusions, she is on Plavix  which I continue for now, will continue monitoring, I will ask her to have CBC done within 10 days. Essential hypertension blood pressure elevated will put her  back on lisinopril  very small dose and then we will recheck Chem-7 and blood pressure.  She is scheduled to  see her primary care physician about 2 weeks I will see her back in about 4 weeks. History of GI bleed again as described above will check CBC   Medication Adjustments/Labs and Tests Ordered: Current medicines are reviewed at length with the patient today.  Concerns regarding medicines are outlined above.  No orders of the defined types were placed in this encounter.  Medication changes: No orders of the defined types were placed in this encounter.   Signed, Lamar DOROTHA Fitch, MD, [facility] 04/04/2024 12:03 PM    [facility]

## 2024-04-04 NOTE — Patient Instructions (Signed)
 Medication Instructions:  Your physician has recommended you make the following change in your medication:   START: Lisinopril  2.5 mg daily  *If you need a refill on your cardiac medications before your next appointment, please call your pharmacy*  Lab Work: Your physician recommends that you return for lab work in:   Labs in 10 days: BMP, CBC  If you have labs (blood work) drawn today and your tests are completely normal, you will receive your results only by: MyChart Message (if you have MyChart) OR A paper copy in the mail If you have any lab test that is abnormal or we need to change your treatment, we will call you to review the results.  Testing/Procedures: None  Follow-Up: At [facility], you and your health needs are our priority.  As part of our continuing mission to provide you with exceptional heart care, our providers are all part of one team.  This team includes your primary Cardiologist (physician) and Advanced Practice Providers or APPs (Physician Assistants and Nurse Practitioners) who all work together to provide you with the care you need, when you need it.  Your next appointment:   1 month(s)  Provider:   Lamar Fitch, MD    We recommend signing up for the patient portal called MyChart.  Sign up information is provided on this After Visit Summary.  MyChart is used to connect with patients for Virtual Visits (Telemedicine).  Patients are able to view lab/test results, encounter notes, upcoming appointments, etc.  Non-urgent messages can be sent to your provider as well.   To learn more about what you can do with MyChart, go to [email or web address].   Other Instructions None

## 2024-04-05 DIAGNOSIS — E113511 Type 2 diabetes mellitus with proliferative diabetic retinopathy with macular edema, right eye: Secondary | ICD-10-CM | POA: Diagnosis not present

## 2024-04-06 DIAGNOSIS — N1832 Chronic kidney disease, stage 3b: Secondary | ICD-10-CM | POA: Diagnosis not present

## 2024-04-06 DIAGNOSIS — E7800 Pure hypercholesterolemia, unspecified: Secondary | ICD-10-CM | POA: Diagnosis not present

## 2024-04-06 DIAGNOSIS — E1151 Type 2 diabetes mellitus with diabetic peripheral angiopathy without gangrene: Secondary | ICD-10-CM | POA: Diagnosis not present

## 2024-04-06 DIAGNOSIS — E1142 Type 2 diabetes mellitus with diabetic polyneuropathy: Secondary | ICD-10-CM | POA: Diagnosis not present

## 2024-04-06 DIAGNOSIS — K5900 Constipation, unspecified: Secondary | ICD-10-CM | POA: Diagnosis not present

## 2024-04-06 DIAGNOSIS — M48061 Spinal stenosis, lumbar region without neurogenic claudication: Secondary | ICD-10-CM | POA: Diagnosis not present

## 2024-04-06 DIAGNOSIS — K254 Chronic or unspecified gastric ulcer with hemorrhage: Secondary | ICD-10-CM | POA: Diagnosis not present

## 2024-04-06 DIAGNOSIS — I2510 Atherosclerotic heart disease of native coronary artery without angina pectoris: Secondary | ICD-10-CM | POA: Diagnosis not present

## 2024-04-06 DIAGNOSIS — Z604 Social exclusion and rejection: Secondary | ICD-10-CM | POA: Diagnosis not present

## 2024-04-06 DIAGNOSIS — Z955 Presence of coronary angioplasty implant and graft: Secondary | ICD-10-CM | POA: Diagnosis not present

## 2024-04-06 DIAGNOSIS — J4489 Other specified chronic obstructive pulmonary disease: Secondary | ICD-10-CM | POA: Diagnosis not present

## 2024-04-06 DIAGNOSIS — I252 Old myocardial infarction: Secondary | ICD-10-CM | POA: Diagnosis not present

## 2024-04-06 DIAGNOSIS — M810 Age-related osteoporosis without current pathological fracture: Secondary | ICD-10-CM | POA: Diagnosis not present

## 2024-04-06 DIAGNOSIS — E11319 Type 2 diabetes mellitus with unspecified diabetic retinopathy without macular edema: Secondary | ICD-10-CM | POA: Diagnosis not present

## 2024-04-06 DIAGNOSIS — Z8673 Personal history of transient ischemic attack (TIA), and cerebral infarction without residual deficits: Secondary | ICD-10-CM | POA: Diagnosis not present

## 2024-04-06 DIAGNOSIS — Z794 Long term (current) use of insulin: Secondary | ICD-10-CM | POA: Diagnosis not present

## 2024-04-06 DIAGNOSIS — I129 Hypertensive chronic kidney disease with stage 1 through stage 4 chronic kidney disease, or unspecified chronic kidney disease: Secondary | ICD-10-CM | POA: Diagnosis not present

## 2024-04-06 DIAGNOSIS — Z7984 Long term (current) use of oral hypoglycemic drugs: Secondary | ICD-10-CM | POA: Diagnosis not present

## 2024-04-06 DIAGNOSIS — D62 Acute posthemorrhagic anemia: Secondary | ICD-10-CM | POA: Diagnosis not present

## 2024-04-06 DIAGNOSIS — G8929 Other chronic pain: Secondary | ICD-10-CM | POA: Diagnosis not present

## 2024-04-06 DIAGNOSIS — E1122 Type 2 diabetes mellitus with diabetic chronic kidney disease: Secondary | ICD-10-CM | POA: Diagnosis not present

## 2024-04-10 DIAGNOSIS — E7800 Pure hypercholesterolemia, unspecified: Secondary | ICD-10-CM | POA: Diagnosis not present

## 2024-04-10 DIAGNOSIS — M48061 Spinal stenosis, lumbar region without neurogenic claudication: Secondary | ICD-10-CM | POA: Diagnosis not present

## 2024-04-10 DIAGNOSIS — I252 Old myocardial infarction: Secondary | ICD-10-CM | POA: Diagnosis not present

## 2024-04-10 DIAGNOSIS — E1151 Type 2 diabetes mellitus with diabetic peripheral angiopathy without gangrene: Secondary | ICD-10-CM | POA: Diagnosis not present

## 2024-04-10 DIAGNOSIS — Z794 Long term (current) use of insulin: Secondary | ICD-10-CM | POA: Diagnosis not present

## 2024-04-10 DIAGNOSIS — D62 Acute posthemorrhagic anemia: Secondary | ICD-10-CM | POA: Diagnosis not present

## 2024-04-10 DIAGNOSIS — G8929 Other chronic pain: Secondary | ICD-10-CM | POA: Diagnosis not present

## 2024-04-10 DIAGNOSIS — Z7984 Long term (current) use of oral hypoglycemic drugs: Secondary | ICD-10-CM | POA: Diagnosis not present

## 2024-04-10 DIAGNOSIS — Z604 Social exclusion and rejection: Secondary | ICD-10-CM | POA: Diagnosis not present

## 2024-04-10 DIAGNOSIS — M810 Age-related osteoporosis without current pathological fracture: Secondary | ICD-10-CM | POA: Diagnosis not present

## 2024-04-10 DIAGNOSIS — I2510 Atherosclerotic heart disease of native coronary artery without angina pectoris: Secondary | ICD-10-CM | POA: Diagnosis not present

## 2024-04-10 DIAGNOSIS — Z8673 Personal history of transient ischemic attack (TIA), and cerebral infarction without residual deficits: Secondary | ICD-10-CM | POA: Diagnosis not present

## 2024-04-10 DIAGNOSIS — E1122 Type 2 diabetes mellitus with diabetic chronic kidney disease: Secondary | ICD-10-CM | POA: Diagnosis not present

## 2024-04-10 DIAGNOSIS — K254 Chronic or unspecified gastric ulcer with hemorrhage: Secondary | ICD-10-CM | POA: Diagnosis not present

## 2024-04-10 DIAGNOSIS — I129 Hypertensive chronic kidney disease with stage 1 through stage 4 chronic kidney disease, or unspecified chronic kidney disease: Secondary | ICD-10-CM | POA: Diagnosis not present

## 2024-04-10 DIAGNOSIS — J4489 Other specified chronic obstructive pulmonary disease: Secondary | ICD-10-CM | POA: Diagnosis not present

## 2024-04-10 DIAGNOSIS — E1142 Type 2 diabetes mellitus with diabetic polyneuropathy: Secondary | ICD-10-CM | POA: Diagnosis not present

## 2024-04-10 DIAGNOSIS — N1832 Chronic kidney disease, stage 3b: Secondary | ICD-10-CM | POA: Diagnosis not present

## 2024-04-10 DIAGNOSIS — K5900 Constipation, unspecified: Secondary | ICD-10-CM | POA: Diagnosis not present

## 2024-04-10 DIAGNOSIS — E11319 Type 2 diabetes mellitus with unspecified diabetic retinopathy without macular edema: Secondary | ICD-10-CM | POA: Diagnosis not present

## 2024-04-10 DIAGNOSIS — Z955 Presence of coronary angioplasty implant and graft: Secondary | ICD-10-CM | POA: Diagnosis not present

## 2024-04-11 ENCOUNTER — Ambulatory Visit (INDEPENDENT_AMBULATORY_CARE_PROVIDER_SITE_OTHER): Admitting: Gastroenterology

## 2024-04-11 ENCOUNTER — Encounter: Payer: Self-pay | Admitting: Gastroenterology

## 2024-04-11 VITALS — BP 134/90 | HR 82 | Ht 63.0 in | Wt 139.1 lb

## 2024-04-11 DIAGNOSIS — R1013 Epigastric pain: Secondary | ICD-10-CM | POA: Diagnosis not present

## 2024-04-11 DIAGNOSIS — R110 Nausea: Secondary | ICD-10-CM

## 2024-04-11 DIAGNOSIS — K250 Acute gastric ulcer with hemorrhage: Secondary | ICD-10-CM

## 2024-04-11 DIAGNOSIS — K259 Gastric ulcer, unspecified as acute or chronic, without hemorrhage or perforation: Secondary | ICD-10-CM | POA: Diagnosis not present

## 2024-04-11 NOTE — Patient Instructions (Addendum)
 Recommend GERD diet No NSAIDs (goody powders) continue Pantoprazole  40mg  po twice  daily for 8 weeks then once daily   Altered bowel habits High fiber diet Try reducing benefiber dose to see if it helps  Your provider has requested that you go to the basement level for lab work before leaving today. Press B on the elevator. The lab is located at the first door on the left as you exit the elevator.  You have been scheduled for an abdominal ultrasound at [facility] (1st floor of hospital) on 04/12/24 at 10:15am. Please arrive 30 minutes prior to your appointment for registration. Make certain not to have anything to eat or drink after midnight prior to your appointment. Should you need to reschedule your appointment, please contact radiology at [phone]. This test typically takes about 30 minutes to perform.  _______________________________________________________  If your blood pressure at your visit was 140/90 or greater, please contact your primary care physician to follow up on this.  _______________________________________________________  If you are age 61 or older, your body mass index should be between 23-30. Your Body mass index is 24.64 kg/m. If this is out of the aforementioned range listed, please consider follow up with your Primary Care Provider.  If you are age 57 or younger, your body mass index should be between 19-25. Your Body mass index is 24.64 kg/m. If this is out of the aformentioned range listed, please consider follow up with your Primary Care Provider.   ________________________________________________________  The [facility] providers would like to encourage you to use MYCHART to communicate with providers for non-urgent requests or questions.  Due to long hold times on the telephone, sending your provider a message by [facility] may be a faster and more efficient way to get a response.  Please allow 48 business hours for a response.  Please remember  that this is for non-urgent requests.  _______________________________________________________  [facility] is using a team-based approach to care.  Your team is made up of your doctor and two to three APPS. Our APPS (Nurse Practitioners and Physician Assistants) work with your physician to ensure care continuity for you. They are fully qualified to address your health concerns and develop a treatment plan. They communicate directly with your gastroenterologist to care for you. Seeing the Advanced Practice Practitioners on your physician's team can help you by facilitating care more promptly, often allowing for earlier appointments, access to diagnostic testing, procedures, and other specialty referrals.   Thank you for trusting me with your gastrointestinal care. Deanna May, FNP-C

## 2024-04-11 NOTE — Progress Notes (Signed)
 Chief Complaint:hospital follow-up  Primary GI Doctor: Dr. 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 Hemoglobin declined from 14-10. Denies a history of previous GI bleeding. No known peptic ulcer disease. Denies NSAIDs.   EGD 03/10/2024 - One nonbleeding pyloric channel ulcer -2 clips placed and pierced at applied to prevent future bleeding.  No active bleeding seen at the time of EGD.   DAPT restarted on 9/9. Paula Hamilton did have melena on the night of 9/9. GI saw her on the day of discharge and recommendation is to take aspirin  but hold off on plavix  until Paula Hamilton sees her PCP on Friday. Hgb 8.2 on discharge.  Interval History   Patient presents for follow-up on recent hospital. Accompanied by her husband. We reviewed the endoscopy and findings. Patient taking Pantoprazole  40mg  po twice daily. Patient was on baby ASA po daily that has been stopped. Patient restarted Plavix  about a week ago.    Paula Hamilton also mentions Paula Hamilton had been taking good powders for back pain prior to admission, Paula Hamilton has stopped since then. Paula Hamilton is currently taking tramadol  which helps some. Paula Hamilton notes three back surgeries and chronic back issues. Appetite good. Paula Hamilton notes mild nausea few times a week.  Patient denies black stools. Paula Hamilton has had intermittent heartburn and belching.    Patient reports for the past 2.5 weeks Paula Hamilton has had lower side pain that radiates to front underneath ribcage. Paula Hamilton reports it hurts all the time. Paula Hamilton reports pain is worse with eating. Paula Hamilton feels certain positions Novella Abraha affect the intensity. Paula Hamilton reports pain is constant.    Paula Hamilton takes OTC Benefiber for irregular BM's which helps.   Blood pressure per patient has been increasing since hospitalization. Paula Hamilton recently was placed on lisinopril . Paula Hamilton is experiencing a lot of back pain today. Paula Hamilton  needs assistance off/on table. Blood pressure elevated today. Paula Hamilton's keeping log at home.   Wt Readings from Last 3 Encounters:  04/11/24 139 lb 2 oz (63.1 kg)  04/04/24 141 lb (64 kg)  03/23/24 137 lb 1.6 oz (62.2 kg)   Past Medical History:  Diagnosis Date   Anxiety    Asthma    CAD (coronary artery disease)    with previous MI, f/by [facility]   Chronic pain    due to OA and Neuropathy f/by Dr. Joshua of Neurosurgery   Controlled type 2 diabetes mellitus with neuropathy (HCC)    COPD (chronic obstructive pulmonary disease) (HCC)    Depression    Diabetic retinopathy (HCC)    Gastric ulcer with hemorrhage    2025   GERD (gastroesophageal reflux disease)    Hx of migraines    Hypercholesterolemia    Hypertension    Lung nodule    2025, with serial CT scans advised   MI (myocardial infarction) (HCC)    Osteoporosis    Pneumonia    x 1 yrs ago   Seizures (HCC) 09/2018   w/stroke in 2020   Stroke (HCC) 09/15/2018   multiple, f/by [facility]   Tobacco abuse     Past Surgical History:  Procedure Laterality Date   ABDOMINAL HYSTERECTOMY     APPENDECTOMY     age 61   BACK SURGERY     lower back 4 time, tens surgery too   CERVICAL FUSION  COLONOSCOPY W/ BIOPSIES AND POLYPECTOMY N/A 11/21/2014   f/by Dr. Charlanne   CORONARY STENT INTERVENTION N/A 12/20/2023   Procedure: CORONARY STENT INTERVENTION;  Surgeon: Swaziland, Peter M, MD;  Location: [facility] INVASIVE CV LAB;  Service: Cardiovascular;  Laterality: N/A;   ESOPHAGOGASTRODUODENOSCOPY  02/26/2016   Moderate gastritis. Otherwise normal EGD    ESOPHAGOGASTRODUODENOSCOPY N/A 03/10/2024   Procedure: EGD (ESOPHAGOGASTRODUODENOSCOPY);  Surgeon: Suzann Inocente HERO, MD;  Location: [facility] ENDOSCOPY;  Service: Gastroenterology;  Laterality: N/A;   EYE SURGERY     laser surgery on left eye 2023; cataracts removed bilateral 2023   HEMOSTASIS CLIP PLACEMENT  03/10/2024   Procedure: CONTROL OF HEMORRHAGE, GI TRACT, ENDOSCOPIC, BY CLIPPING OR  OVERSEWING;  Surgeon: Suzann Inocente HERO, MD;  Location: [facility] ENDOSCOPY;  Service: Gastroenterology;;   LEFT HEART CATH AND CORONARY ANGIOGRAPHY N/A 12/20/2023   Procedure: LEFT HEART CATH AND CORONARY ANGIOGRAPHY;  Surgeon: Swaziland, Peter M, MD;  Location: [facility] INVASIVE CV LAB;  Service: Cardiovascular;  Laterality: N/A;   MAXIMUM ACCESS (MAS)POSTERIOR LUMBAR INTERBODY FUSION (PLIF) 1 LEVEL N/A 04/12/2013   Procedure: Lumba four-five maximum access surgery Posterior Lumbar Interbody Fusion;  Surgeon: Alm GORMAN Molt, MD;  Location: [facility] NEURO ORS;  Service: Neurosurgery;  Laterality: N/A;  Lumba four-five maximum access surgery Posterior Lumbar Interbody Fusion   SPINAL CORD STIMULATOR INSERTION N/A 06/14/2014   Procedure: LUMBAR SPINAL CORD STIMULATOR INSERTION;  Surgeon: Deward JAYSON Fabian, MD;  Location: [facility] NEURO ORS;  Service: Neurosurgery;  Laterality: N/A;   SPINAL CORD STIMULATOR REMOVAL N/A 04/12/2017   Procedure: LUMBAR SPINAL CORD STIMULATOR REMOVAL;  Surgeon: Fabian Deward, MD;  Location: [facility] OR;  Service: Neurosurgery;  Laterality: N/A;   TUBAL LIGATION  1984    Current Outpatient Medications  Medication Sig Dispense Refill   Bempedoic Acid-Ezetimibe  (NEXLIZET ) 180-10 MG TABS Take by mouth daily.     Blood Glucose Monitoring Suppl DEVI 1 each by Does not apply route as needed. Trinia Georgi substitute to any manufacturer covered by patient's insurance. 1 each 3   diphenoxylate -atropine  (LOMOTIL ) 2.5-0.025 MG tablet Take 1 tablet by mouth 3 (three) times daily as needed for diarrhea or loose stools. 90 tablet 1   DULoxetine  (CYMBALTA ) 30 MG capsule Take 30 mg by mouth daily.     empagliflozin  (JARDIANCE ) 10 MG TABS tablet Take 1 tablet (10 mg total) by mouth daily before breakfast. 30 tablet 11   insulin  glargine (LANTUS  SOLOSTAR) 100 UNIT/ML Solostar Pen Inject 40 Units into the skin in the morning.     levETIRAcetam  (KEPPRA ) 500 MG tablet Take 1 tablet (500 mg total) by mouth 2 (two) times daily. 180 tablet  4   lisinopril  (ZESTRIL ) 2.5 MG tablet Take 1 tablet (2.5 mg total) by mouth daily. 90 tablet 3   nitroGLYCERIN  (NITROSTAT ) 0.4 MG SL tablet Place 1 tablet (0.4 mg total) under the tongue every 5 (five) minutes as needed. 25 tablet 2   pantoprazole  (PROTONIX ) 40 MG tablet Take 1 tablet (40 mg total) by mouth 2 (two) times daily before a meal. 60 tablet 0   traMADol  (ULTRAM ) 50 MG tablet Take 50 mg by mouth every 6 (six) hours as needed.     No current facility-administered medications for this visit.    Allergies as of 04/11/2024 - Review Complete 04/11/2024  Allergen Reaction Noted   Codeine Rash and Swelling 11/17/2012   Penicillins Rash 11/17/2012   Pregabalin  Other (See Comments) 06/13/2015   Atorvastatin  08/20/2021   Doxycycline Swelling 08/20/2021   Metformin Other (  See Comments) 08/20/2021   Motrin [ibuprofen]  03/19/2024   Erythromycin base Nausea And Vomiting 12/19/2013   Morphine  and codeine Nausea And Vomiting 11/17/2012    Family History  Problem Relation Age of Onset   Breast cancer Mother    Lung cancer Mother    COPD Mother    Melanoma Father    Stroke Sister        in 2   Diabetes Sister    Stroke Brother        in 2   Diabetes Brother    Diabetes Brother    Stomach cancer Paternal Aunt    Colon cancer Paternal Uncle    Kidney cancer Paternal Uncle    Esophageal cancer Neg Hx    Liver disease Neg Hx     Review of Systems:    Constitutional: No weight loss, fever, chills, weakness or fatigue HEENT: Eyes: No change in vision               Ears, Nose, Throat:  No change in hearing or congestion Skin: No rash or itching Cardiovascular: No chest pain, chest pressure or palpitations   Respiratory: No SOB or cough Gastrointestinal: See HPI and otherwise negative Genitourinary: No dysuria or change in urinary frequency Neurological: No headache, dizziness or syncope Musculoskeletal: No new muscle or joint pain Hematologic: No bleeding or  bruising Psychiatric: No history of depression or anxiety    Physical Exam:  Vital signs: BP (!) 134/90 (BP Location: Left Arm, Patient Position: Sitting;Standing, Cuff Size: Normal)   Pulse 82   Ht 5' 3 (1.6 m) Comment: height measured without shoes  Wt 139 lb 2 oz (63.1 kg)   BMI 24.64 kg/m   Constitutional:   Pleasant female appears to be in NAD, Well developed, Well nourished, alert and cooperative Throat: Oral cavity and pharynx without inflammation, swelling or lesion.  Respiratory: Respirations even and unlabored. Lungs clear to auscultation bilaterally.   No wheezes, crackles, or rhonchi.  Cardiovascular: Normal S1, S2. Regular rate and rhythm. No peripheral edema, cyanosis or pallor.  Gastrointestinal:  Soft, nondistended, nontender. No rebound or guarding. Normal bowel sounds. No appreciable masses or hepatomegaly. Rectal: DRE normal with brown stool Msk:  Symmetrical without gross deformities. Without edema, no deformity or joint abnormality.  Neurologic:  Alert and  oriented x4;  grossly normal neurologically.  Skin:   Dry and intact without significant lesions or rashes.  RELEVANT LABS AND IMAGING: CBC    Latest Ref Rng & Units 03/27/2024    1:46 PM 03/16/2024    3:06 PM 03/14/2024    1:31 AM  CBC  WBC 3.4 - 10.8 x10E3/uL 10.4  9.9    Hemoglobin 11.1 - 15.9 g/dL 88.2  9.9  8.2   Hematocrit 34.0 - 46.6 % 37.2  30.4  24.6   Platelets 150 - 450 x10E3/uL 475  456       CMP     Latest Ref Rng & Units 03/14/2024    1:31 AM 03/11/2024    8:13 AM 03/10/2024    2:39 AM  CMP  Glucose 70 - 99 mg/dL 838  871  898   BUN 8 - 23 mg/dL 29  37  63   Creatinine 0.44 - 1.00 mg/dL 8.48  8.73  8.61   Sodium 135 - 145 mmol/L 141  140  144   Potassium 3.5 - 5.1 mmol/L 4.7  4.3  4.0   Chloride 98 - 111 mmol/L 109  111  120   CO2 22 - 32 mmol/L 23  20  19    Calcium 8.9 - 10.3 mg/dL 8.9  9.1  8.5      Lab Results  Component Value Date   TSH 0.582 02/16/2024      Assessment: Encounter Diagnoses  Name Primary?   Acute gastric ulcer with hemorrhage Yes   Abdominal pain, epigastric    Nausea without vomiting      73 year female patient who presents for hospital follow-up:  #1 Hematemesis in setting of aspirin  and Plavix /recent MI 6/25 with PCI/stents. EGD 9/6: Nonbleeding cratered gastric ulcer noted which was clipped. ASA discontinued. Plavix  restarted week ago. Patient on PPI therapy BID for 8 weeks then daily. Pt has avoided NSAIDs. No overt bleeding.  Hgb stable 11.7 Sept. Will recheck today.   #2 Patient complains today of right lower back pain that radiates to the front.  Will go ahead and do right upper quadrant ultrasound to rule out gallbladder disease.  Will also check H. pylori Diatherix stool test in office today..  Patient also expresses a lot of back pain and difficulty maneuvering in room today.  Recommended Paula Hamilton contact the back specialist to see what other alternative treatment options they have.    Plan: - check H/H today -No NSAIDs -GERD diet - continue Pantoprazole  40mg  po twice  daily for 8 weeks then daily -RUQ abdominal u/s  -h pylori diathereix stool test in office -recommend follow-up with back specialist  Thank you for the courtesy of this consult. Please call me with any questions or concerns.   Elaina Cara, FNP-C [facility] 04/11/2024, 3:12 PM  Cc: [name], MD

## 2024-04-12 ENCOUNTER — Ambulatory Visit (INDEPENDENT_AMBULATORY_CARE_PROVIDER_SITE_OTHER)
Admission: RE | Admit: 2024-04-12 | Discharge: 2024-04-12 | Disposition: A | Discharge status: Home / Self Care | Source: Ambulatory Visit | Attending: Gastroenterology | Admitting: Gastroenterology

## 2024-04-12 DIAGNOSIS — R1013 Epigastric pain: Secondary | ICD-10-CM | POA: Diagnosis not present

## 2024-04-12 DIAGNOSIS — K250 Acute gastric ulcer with hemorrhage: Secondary | ICD-10-CM

## 2024-04-13 ENCOUNTER — Ambulatory Visit: Payer: Self-pay | Admitting: Gastroenterology

## 2024-04-17 ENCOUNTER — Other Ambulatory Visit (HOSPITAL_BASED_OUTPATIENT_CLINIC_OR_DEPARTMENT_OTHER): Payer: Self-pay | Admitting: Family Medicine

## 2024-04-17 ENCOUNTER — Other Ambulatory Visit (HOSPITAL_BASED_OUTPATIENT_CLINIC_OR_DEPARTMENT_OTHER): Payer: Self-pay | Admitting: Student

## 2024-04-17 DIAGNOSIS — E1151 Type 2 diabetes mellitus with diabetic peripheral angiopathy without gangrene: Secondary | ICD-10-CM | POA: Diagnosis not present

## 2024-04-17 DIAGNOSIS — K219 Gastro-esophageal reflux disease without esophagitis: Secondary | ICD-10-CM

## 2024-04-17 DIAGNOSIS — I25118 Atherosclerotic heart disease of native coronary artery with other forms of angina pectoris: Secondary | ICD-10-CM | POA: Diagnosis not present

## 2024-04-17 DIAGNOSIS — K264 Chronic or unspecified duodenal ulcer with hemorrhage: Secondary | ICD-10-CM | POA: Diagnosis not present

## 2024-04-18 LAB — BASIC METABOLIC PANEL WITH GFR
BUN/Creatinine Ratio: 19 (ref 12–28)
BUN: 25 mg/dL (ref 8–27)
CO2: 23 mmol/L (ref 20–29)
Calcium: 10.2 mg/dL (ref 8.7–10.3)
Chloride: 100 mmol/L (ref 96–106)
Creatinine, Ser: 1.29 mg/dL — ABNORMAL HIGH (ref 0.57–1.00)
Glucose: 123 mg/dL — ABNORMAL HIGH (ref 70–99)
Potassium: 4.6 mmol/L (ref 3.5–5.2)
Sodium: 141 mmol/L (ref 134–144)
eGFR: 44 mL/min/1.73 — ABNORMAL LOW (ref >59)

## 2024-04-18 LAB — CBC
Hematocrit: 42.4 % (ref 34.0–46.6)
Hemoglobin: 13.8 g/dL (ref 11.1–15.9)
MCH: 32.9 pg (ref 26.6–33.0)
MCHC: 32.5 g/dL (ref 31.5–35.7)
MCV: 101 fL — ABNORMAL HIGH (ref 79–97)
Platelets: 431 x10E3/uL (ref 150–450)
RBC: 4.19 x10E6/uL (ref 3.77–5.28)
RDW: 15.3 % (ref 11.7–15.4)
WBC: 10.5 x10E3/uL (ref 3.4–10.8)

## 2024-04-19 ENCOUNTER — Ambulatory Visit: Payer: Self-pay | Admitting: Cardiology

## 2024-04-19 ENCOUNTER — Encounter (HOSPITAL_BASED_OUTPATIENT_CLINIC_OR_DEPARTMENT_OTHER): Payer: Self-pay | Admitting: Family Medicine

## 2024-04-19 ENCOUNTER — Ambulatory Visit (INDEPENDENT_AMBULATORY_CARE_PROVIDER_SITE_OTHER): Admitting: Family Medicine

## 2024-04-19 VITALS — BP 129/78 | HR 85 | Temp 98.1°F | Resp 16 | Wt 140.8 lb

## 2024-04-19 DIAGNOSIS — M810 Age-related osteoporosis without current pathological fracture: Secondary | ICD-10-CM | POA: Diagnosis not present

## 2024-04-19 DIAGNOSIS — Z23 Encounter for immunization: Secondary | ICD-10-CM | POA: Diagnosis not present

## 2024-04-19 DIAGNOSIS — N1832 Chronic kidney disease, stage 3b: Secondary | ICD-10-CM | POA: Diagnosis not present

## 2024-04-19 DIAGNOSIS — E118 Type 2 diabetes mellitus with unspecified complications: Secondary | ICD-10-CM

## 2024-04-19 DIAGNOSIS — Z794 Long term (current) use of insulin: Secondary | ICD-10-CM

## 2024-04-19 DIAGNOSIS — Z8719 Personal history of other diseases of the digestive system: Secondary | ICD-10-CM

## 2024-04-19 DIAGNOSIS — Z720 Tobacco use: Secondary | ICD-10-CM | POA: Diagnosis not present

## 2024-04-19 NOTE — Progress Notes (Signed)
 Established Patient Office Visit  Subjective   Patient ID: Paula Hamilton, female    DOB: [birth year]  Age: 73 y.o. MRN: [id number]  Chief Complaint  Patient presents with   Follow-up    Follow-up    F/u as above.  Please see last note for details.  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  I suggested low dose Elavil but she declines this.  Extended discussion about her numerous issues.    Past Medical History:  Diagnosis Date   Anxiety    Asthma    CAD (coronary artery disease)    with previous MI, f/by [facility]   Chronic pain    due to OA and Neuropathy f/by Dr. Joshua of Neuro[facility]   CKD (chronic kidney disease) stage 3, GFR 30-59 ml/min [facility])    with Nephrology declined   Controlled type 2 diabetes mellitus with neuropathy (HCC)    COPD (chronic obstructive pulmonary disease) (HCC)    Depression    Diabetic retinopathy (HCC)    Sees eye doctor   Gastric ulcer with hemorrhage    2025, f/by [facility]   GERD (gastroesophageal reflux disease)    Hx of migraines    Hypercholesterolemia    Hypertension    Lung nodule    2025, with serial CT scans advised   Osteoporosis    Prolia declined   Pneumonia    Pneumovax declined   Seizures (HCC) 09/2018   w/stroke in 2020   Stroke [facility]) 09/15/2018   multiple, f/by [facility]   Tobacco abuse     Outpatient Encounter Medications as of 04/19/2024  Medication Sig   Bempedoic Acid-Ezetimibe  (NEXLIZET ) 180-10 MG TABS Take by mouth daily.   diphenoxylate -atropine  (LOMOTIL ) 2.5-0.025 MG tablet Take 1 tablet by mouth 3 (three) times daily as needed for diarrhea or loose stools.   DULoxetine  (CYMBALTA ) 30 MG capsule Take 30 mg by mouth daily.   empagliflozin  (JARDIANCE ) 10 MG TABS tablet Take 1 tablet (10 mg total) by mouth daily before breakfast.   insulin  glargine (LANTUS  SOLOSTAR) 100 UNIT/ML Solostar Pen Inject 40 Units into the skin in the morning.   levETIRAcetam   (KEPPRA ) 500 MG tablet Take 1 tablet (500 mg total) by mouth 2 (two) times daily.   lisinopril  (ZESTRIL ) 2.5 MG tablet Take 1 tablet (2.5 mg total) by mouth daily.   nitroGLYCERIN  (NITROSTAT ) 0.4 MG SL tablet Place 1 tablet (0.4 mg total) under the tongue every 5 (five) minutes as needed.   pantoprazole  (PROTONIX ) 40 MG tablet Take 1 tablet (40 mg total) by mouth 2 (two) times daily before a meal.   traMADol  (ULTRAM ) 50 MG tablet Take 50 mg by mouth every 6 (six) hours as needed.   No facility-administered encounter medications on file as of 04/19/2024.    Social History   Tobacco Use   Smoking status: Every Day    Current packs/day: 0.00    Average packs/day: 1 pack/day for 30.0 years (30.0 ttl pk-yrs)    Types: Cigarettes    Start date: 05/21/1981    Last attempt to quit: 05/22/2011    Years since quitting: 12.9   Smokeless tobacco: Never   Tobacco comments:    Started smoking again in back in 2012 per patient on 04/29/23.  Vaping Use   Vaping status: Never Used  Substance Use Topics   Alcohol  use: Not Currently    Comment: occasional beer   Drug use: No      Review  of Systems  Constitutional:  Negative for diaphoresis, fever, malaise/fatigue and weight loss.  Respiratory:  Negative for cough, shortness of breath and wheezing.   Cardiovascular:  Negative for chest pain, palpitations, orthopnea, claudication, leg swelling and PND.      Objective:     BP 129/78 (BP Location: Right Arm, Patient Position: Standing, Cuff Size: Normal)   Pulse 85   Temp 98.1 F (36.7 C) (Oral)   Resp 16   Wt 140 lb 12.8 oz (63.9 kg)   SpO2 94%   BMI 24.94 kg/m    Physical Exam Constitutional:      General: She is not in acute distress.    Appearance: Normal appearance.  HENT:     Head: Normocephalic.  Neck:     Vascular: No carotid bruit.  Cardiovascular:     Rate and Rhythm: Normal rate and regular rhythm.     Pulses: Normal pulses.     Heart sounds: Normal heart sounds.   Pulmonary:     Effort: Pulmonary effort is normal.     Breath sounds: Normal breath sounds.  Abdominal:     General: Bowel sounds are normal.     Palpations: Abdomen is soft.  Musculoskeletal:     Cervical back: Neck supple. No tenderness.     Right lower leg: No edema.     Left lower leg: No edema.  Neurological:     Mental Status: She is alert.      No results found for any visits on 04/19/24.    The ASCVD Risk score (Arnett DK, et al., 2019) failed to calculate for the following reasons:   Risk score cannot be calculated because patient has a medical history suggesting prior/existing ASCVD    Assessment & Plan:  Stage 3b chronic kidney disease (HCC) Assessment & Plan: Stable.  Nephrology declined.  Continue to monitor.   Encounter for immunization -     Flu vaccine HIGH DOSE PF(Fluzone Trivalent)  Tobacco abuse Assessment & Plan: Again discouraged.  She declines assistance in stopping.   Age related osteoporosis, unspecified pathological fracture presence Assessment & Plan: Prolia encouraged, and she agrees to consider it after a good bit of discussion.   History of GI bleed Assessment & Plan: F/u with GI as directed.   Type 2 diabetes mellitus with complication, with long-term current use of insulin  [facility]) Assessment & Plan: She declines a repeat A1c today.     Return in about 4 weeks (around 05/17/2024) for chronic follow-up.    REDDING PONCE NORLEEN FALCON., MD

## 2024-04-19 NOTE — Progress Notes (Signed)
 Patient is in office today for a nurse visit for Immunization. Patient Injection was given in the  Right deltoid. Patient tolerated injection well.

## 2024-04-20 ENCOUNTER — Encounter (HOSPITAL_BASED_OUTPATIENT_CLINIC_OR_DEPARTMENT_OTHER): Payer: Self-pay | Admitting: Family Medicine

## 2024-04-20 ENCOUNTER — Ambulatory Visit (HOSPITAL_BASED_OUTPATIENT_CLINIC_OR_DEPARTMENT_OTHER): Admitting: Family Medicine

## 2024-04-20 NOTE — Assessment & Plan Note (Signed)
 F/u with GI as directed

## 2024-04-20 NOTE — Assessment & Plan Note (Signed)
 Prolia encouraged, and she agrees to consider it after a good bit of discussion.

## 2024-04-20 NOTE — Assessment & Plan Note (Signed)
 Stable.  Nephrology declined.  Continue to monitor.

## 2024-04-20 NOTE — Assessment & Plan Note (Signed)
 She declines a repeat A1c today.

## 2024-04-20 NOTE — Assessment & Plan Note (Signed)
 Again discouraged.  She declines assistance in stopping.

## 2024-05-03 ENCOUNTER — Telehealth: Payer: Self-pay

## 2024-05-03 NOTE — Telephone Encounter (Signed)
 Left message on My Chart with lab results per Dr. Karry note. Routed to PCP

## 2024-05-09 ENCOUNTER — Encounter: Payer: Self-pay | Admitting: Cardiology

## 2024-05-09 ENCOUNTER — Other Ambulatory Visit (HOSPITAL_BASED_OUTPATIENT_CLINIC_OR_DEPARTMENT_OTHER): Payer: Self-pay | Admitting: Neurological Surgery

## 2024-05-09 ENCOUNTER — Ambulatory Visit: Attending: Cardiology | Admitting: Cardiology

## 2024-05-09 VITALS — BP 140/90 | HR 85 | Ht 63.0 in | Wt 138.0 lb

## 2024-05-09 DIAGNOSIS — E7800 Pure hypercholesterolemia, unspecified: Secondary | ICD-10-CM

## 2024-05-09 DIAGNOSIS — E1140 Type 2 diabetes mellitus with diabetic neuropathy, unspecified: Secondary | ICD-10-CM

## 2024-05-09 DIAGNOSIS — S32009K Unspecified fracture of unspecified lumbar vertebra, subsequent encounter for fracture with nonunion: Secondary | ICD-10-CM

## 2024-05-09 DIAGNOSIS — J410 Simple chronic bronchitis: Secondary | ICD-10-CM | POA: Diagnosis not present

## 2024-05-09 DIAGNOSIS — I25118 Atherosclerotic heart disease of native coronary artery with other forms of angina pectoris: Secondary | ICD-10-CM

## 2024-05-09 DIAGNOSIS — Z720 Tobacco use: Secondary | ICD-10-CM

## 2024-05-09 DIAGNOSIS — Z981 Arthrodesis status: Secondary | ICD-10-CM | POA: Diagnosis not present

## 2024-05-09 NOTE — Patient Instructions (Signed)
 Medication Instructions:  Your physician recommends that you continue on your current medications as directed. Please refer to the Current Medication list given to you today.  *If you need a refill on your cardiac medications before your next appointment, please call your pharmacy*   Lab Work: Lipid, AST, ALT, APO-B, Lpa-today If you have labs (blood work) drawn today and your tests are completely normal, you will receive your results only by: MyChart Message (if you have MyChart) OR A paper copy in the mail If you have any lab test that is abnormal or we need to change your treatment, we will call you to review the results.   Testing/Procedures: None Ordered   Follow-Up: At [facility], you and your health needs are our priority.  As part of our continuing mission to provide you with exceptional heart care, we have created designated Provider Care Teams.  These Care Teams include your primary Cardiologist (physician) and Advanced Practice Providers (APPs -  Physician Assistants and Nurse Practitioners) who all work together to provide you with the care you need, when you need it.  We recommend signing up for the patient portal called MyChart.  Sign up information is provided on this After Visit Summary.  MyChart is used to connect with patients for Virtual Visits (Telemedicine).  Patients are able to view lab/test results, encounter notes, upcoming appointments, etc.  Non-urgent messages can be sent to your provider as well.   To learn more about what you can do with MyChart, go to [email or web address].    Your next appointment:   3 month(s)  The format for your next appointment:   In Person  Provider:   Lamar Fitch, MD    Other Instructions NA

## 2024-05-09 NOTE — Progress Notes (Unsigned)
 Cardiology Office Note:    Date:  05/09/2024   ID:  Paula Hamilton, DOB 12-30-1950, MRN [id number]  PCP:  [name], MD  Cardiologist:  Lamar Fitch, MD    Referring MD: [name], MD   Chief Complaint  Patient presents with   Follow-up  Doing fine but have a back problem  History of Present Illness:    Paula Hamilton is a 73 y.o. female past medical history significant for coronary artery disease, few months ago in the summer she ended up coming to the hospital with chest pain cardiac catheterization has been done two-vessel disease is being identified, PTCA and stent of mid LAD has been performed as well as obtuse marginal branch.  She did have multiple noncritical lesions.  She is a chronic smoker send still continues to smoke cut down some, since I have seen her last time she is doing fine before last visit she had a being in the hospital because of GI bleed now she is only on monotherapy, she does have chronic back problem and there is some discussion about potentially having back surgery.  No more GI bleed.  Denies of any chest pain tightness squeezing pressure mid chest but ability to exercise limited,  Past Medical History:  Diagnosis Date   Anxiety    Asthma    CAD (coronary artery disease)    with previous MI, f/by [facility]   Chronic pain    due to OA and Neuropathy f/by Dr. Joshua of Neurosurgery   CKD (chronic kidney disease) stage 3, GFR 30-59 ml/min [facility])    with Nephrology declined   Controlled type 2 diabetes mellitus with neuropathy (HCC)    COPD (chronic obstructive pulmonary disease) (HCC)    Depression    Diabetic retinopathy (HCC)    Sees eye doctor   Gastric ulcer with hemorrhage    2025, f/by [facility]   GERD (gastroesophageal reflux disease)    Hx of migraines    Hypercholesterolemia    Hypertension    Lung nodule    2025, with serial CT scans advised   Osteoporosis    Prolia declined   Pneumonia    Pneumovax  declined   Seizures (HCC) 09/2018   w/stroke in 2020   Stroke (HCC) 09/15/2018   multiple, f/by [facility]   Tobacco abuse     Past Surgical History:  Procedure Laterality Date   ABDOMINAL HYSTERECTOMY     APPENDECTOMY     age 89   BACK SURGERY     lower back 4 time, tens surgery too   CERVICAL FUSION     COLONOSCOPY W/ BIOPSIES AND POLYPECTOMY N/A 11/21/2014   f/by Dr. Charlanne   CORONARY STENT INTERVENTION N/A 12/20/2023   Procedure: CORONARY STENT INTERVENTION;  Surgeon: Jordan, Peter M, MD;  Location: [facility] INVASIVE CV LAB;  Service: Cardiovascular;  Laterality: N/A;   ESOPHAGOGASTRODUODENOSCOPY  02/26/2016   Moderate gastritis. Otherwise normal EGD    ESOPHAGOGASTRODUODENOSCOPY N/A 03/10/2024   Procedure: EGD (ESOPHAGOGASTRODUODENOSCOPY);  Surgeon: Suzann Inocente CHRISTELLA, MD;  Location: [facility] ENDOSCOPY;  Service: Gastroenterology;  Laterality: N/A;   EYE SURGERY     laser surgery on left eye 2023; cataracts removed bilateral 2023   HEMOSTASIS CLIP PLACEMENT  03/10/2024   Procedure: CONTROL OF HEMORRHAGE, GI TRACT, ENDOSCOPIC, BY CLIPPING OR OVERSEWING;  Surgeon: Suzann Inocente CHRISTELLA, MD;  Location: [facility] ENDOSCOPY;  Service: Gastroenterology;;   LEFT HEART CATH AND CORONARY ANGIOGRAPHY N/A 12/20/2023  Procedure: LEFT HEART CATH AND CORONARY ANGIOGRAPHY;  Surgeon: Jordan, Peter M, MD;  Location: [facility] INVASIVE CV LAB;  Service: Cardiovascular;  Laterality: N/A;   MAXIMUM ACCESS (MAS)POSTERIOR LUMBAR INTERBODY FUSION (PLIF) 1 LEVEL N/A 04/12/2013   Procedure: Lumba four-five maximum access surgery Posterior Lumbar Interbody Fusion;  Surgeon: Alm GORMAN Molt, MD;  Location: [facility] NEURO ORS;  Service: Neurosurgery;  Laterality: N/A;  Lumba four-five maximum access surgery Posterior Lumbar Interbody Fusion   SPINAL CORD STIMULATOR INSERTION N/A 06/14/2014   Procedure: LUMBAR SPINAL CORD STIMULATOR INSERTION;  Surgeon: Deward JAYSON Fabian, MD;  Location: [facility] NEURO ORS;  Service: Neurosurgery;  Laterality: N/A;    SPINAL CORD STIMULATOR REMOVAL N/A 04/12/2017   Procedure: LUMBAR SPINAL CORD STIMULATOR REMOVAL;  Surgeon: Fabian Deward, MD;  Location: [facility] OR;  Service: Neurosurgery;  Laterality: N/A;   TUBAL LIGATION  1984    Current Medications: Current Meds  Medication Sig   Bempedoic Acid-Ezetimibe  (NEXLIZET ) 180-10 MG TABS Take by mouth daily.   clopidogrel  (PLAVIX ) 75 MG tablet Take 75 mg by mouth daily.   diphenoxylate -atropine  (LOMOTIL ) 2.5-0.025 MG tablet Take 1 tablet by mouth 3 (three) times daily as needed for diarrhea or loose stools.   DULoxetine  (CYMBALTA ) 30 MG capsule Take 30 mg by mouth daily.   empagliflozin  (JARDIANCE ) 10 MG TABS tablet Take 1 tablet (10 mg total) by mouth daily before breakfast.   insulin  glargine (LANTUS  SOLOSTAR) 100 UNIT/ML Solostar Pen Inject 40 Units into the skin in the morning.   levETIRAcetam  (KEPPRA ) 500 MG tablet Take 1 tablet (500 mg total) by mouth 2 (two) times daily.   lisinopril  (ZESTRIL ) 2.5 MG tablet Take 1 tablet (2.5 mg total) by mouth daily.   nitroGLYCERIN  (NITROSTAT ) 0.4 MG SL tablet Place 1 tablet (0.4 mg total) under the tongue every 5 (five) minutes as needed.   traMADol  (ULTRAM ) 50 MG tablet Take 50 mg by mouth every 6 (six) hours as needed.     Allergies:   Codeine, Penicillins, Pregabalin , Amitriptyline, Atorvastatin, Doxycycline, Metformin, Motrin [ibuprofen], Erythromycin base, and Morphine  and codeine   Social History   Socioeconomic History   Marital status: Widowed    Spouse name: Not on file   Number of children: 2   Years of education: Not on file   Highest education level: Some college, no degree  Occupational History    Comment: retired   Occupation: retired  Tobacco Use   Smoking status: Every Day    Current packs/day: 0.00    Average packs/day: 1 pack/day for 30.0 years (30.0 ttl pk-yrs)    Types: Cigarettes    Start date: 05/21/1981    Last attempt to quit: 05/22/2011    Years since quitting: 12.9   Smokeless  tobacco: Never   Tobacco comments:    Started smoking again in back in 2012 per patient on 04/29/23.  Vaping Use   Vaping status: Never Used  Substance and Sexual Activity   Alcohol  use: Not Currently    Comment: occasional beer   Drug use: No   Sexual activity: Yes    Birth control/protection: Surgical    Comment: Hysterectomy  Other Topics Concern   Not on file  Social History Narrative   Lives with sister, bro-in-law   Caffeine- not much   Social Drivers of Corporate Investment Banker Strain: Not on file  Food Insecurity: No Food Insecurity (03/09/2024)   Hunger Vital Sign    Worried About Running Out of Food in the Last Year: Never  true    Ran Out of Food in the Last Year: Never true  Transportation Needs: No Transportation Needs (03/09/2024)   PRAPARE - Administrator, Civil Service (Medical): No    Lack of Transportation (Non-Medical): No  Physical Activity: Not on file  Stress: Not on file  Social Connections: Socially Integrated (03/09/2024)   Social Connection and Isolation Panel    Frequency of Communication with Friends and Family: Twice a week    Frequency of Social Gatherings with Friends and Family: Twice a week    Attends Religious Services: 1 to 4 times per year    Active Member of [employer] or Organizations: No    Attends Engineer, Structural: 1 to 4 times per year    Marital Status: Married     Family History: The patient's family history includes Breast cancer in her mother; COPD in her mother; Colon cancer in her paternal uncle; Diabetes in her brother, brother, and sister; Kidney cancer in her paternal uncle; Lung cancer in her mother; Melanoma in her father; Stomach cancer in her paternal aunt; Stroke in her brother and sister. There is no history of Esophageal cancer or Liver disease. ROS:   Please see the history of present illness.    All 14 point review of systems negative except as described per history of present  illness  EKGs/Labs/Other Studies Reviewed:         Recent Labs: 02/16/2024: TSH 0.582 02/23/2024: ALT 10 04/17/2024: BUN 25; Creatinine, Ser 1.29; Hemoglobin 13.8; Platelets 431; Potassium 4.6; Sodium 141  Recent Lipid Panel    Component Value Date/Time   CHOL 129 02/22/2024 1536   TRIG 179 (H) 02/22/2024 1536   HDL 34 (L) 02/22/2024 1536   CHOLHDL 3.8 02/22/2024 1536   CHOLHDL 5.6 12/21/2023 0740   VLDL 32 12/21/2023 0740   LDLCALC 65 02/22/2024 1536   LDLCALC 69 09/07/2019 1213    Physical Exam:    VS:  BP (!) 140/90   Pulse 85   Ht 5' 3 (1.6 m)   Wt 138 lb (62.6 kg)   SpO2 97%   BMI 24.45 kg/m     Wt Readings from Last 3 Encounters:  05/09/24 138 lb (62.6 kg)  04/19/24 140 lb 12.8 oz (63.9 kg)  04/11/24 139 lb 2 oz (63.1 kg)     GEN:  Well nourished, well developed in no acute distress HEENT: Normal NECK: No JVD; No carotid bruits LYMPHATICS: No lymphadenopathy CARDIAC: RRR, no murmurs, no rubs, no gallops RESPIRATORY:  Clear to auscultation without rales, wheezing or rhonchi  ABDOMEN: Soft, non-tender, non-distended MUSCULOSKELETAL:  No edema; No deformity  SKIN: Warm and dry LOWER EXTREMITIES: no swelling NEUROLOGIC:  Alert and oriented x 3 PSYCHIATRIC:  Normal affect   ASSESSMENT:    1. Coronary artery disease of native artery of native heart with stable angina pectoris   2. Simple chronic bronchitis (HCC)   3. Controlled type 2 diabetes mellitus with neuropathy (HCC)   4. S/P lumbar fusion   5. Tobacco abuse    PLAN:    In order of problems listed above:  Coronary disease stable from that point we will continue monotherapy because of history of GI bleed. COPD noted, smoking obviously huge problem. Dyslipidemia she is taking Nexlizet  at, I will recheck her fasting lipid profile AST ALT LP(a) ApoB, history of lumbar fusion, look like she may require another surgery.  Will wait for decision from surgeons.  If that is the case  we may need to do  some additional testing for evaluating   Medication Adjustments/Labs and Tests Ordered: Current medicines are reviewed at length with the patient today.  Concerns regarding medicines are outlined above.  No orders of the defined types were placed in this encounter.  Medication changes: No orders of the defined types were placed in this encounter.   Signed, Lamar DOROTHA Fitch, MD, [facility] 05/09/2024 11:45 AM    [facility]

## 2024-05-11 LAB — APOLIPOPROTEIN B: Apolipoprotein B: 57 mg/dL (ref <90)

## 2024-05-11 LAB — LIPID PANEL
Chol/HDL Ratio: 2.6 ratio (ref 0.0–4.4)
Cholesterol, Total: 112 mg/dL (ref 100–199)
HDL: 43 mg/dL (ref >39)
LDL Chol Calc (NIH): 49 mg/dL (ref 0–99)
Triglycerides: 110 mg/dL (ref 0–149)
VLDL Cholesterol Cal: 20 mg/dL (ref 5–40)

## 2024-05-11 LAB — ALT: ALT: 12 IU/L (ref 0–32)

## 2024-05-11 LAB — AST: AST: 15 IU/L (ref 0–40)

## 2024-05-11 LAB — LIPOPROTEIN A (LPA): Lipoprotein (a): 11.9 nmol/L (ref <75.0)

## 2024-05-15 ENCOUNTER — Ambulatory Visit: Payer: Self-pay | Admitting: Cardiology

## 2024-05-16 ENCOUNTER — Ambulatory Visit (INDEPENDENT_AMBULATORY_CARE_PROVIDER_SITE_OTHER)
Admission: RE | Admit: 2024-05-16 | Discharge: 2024-05-16 | Disposition: A | Discharge status: Home / Self Care | Source: Ambulatory Visit | Attending: Neurological Surgery | Admitting: Neurological Surgery

## 2024-05-16 ENCOUNTER — Other Ambulatory Visit (HOSPITAL_BASED_OUTPATIENT_CLINIC_OR_DEPARTMENT_OTHER): Admitting: Radiology

## 2024-05-16 DIAGNOSIS — S32009K Unspecified fracture of unspecified lumbar vertebra, subsequent encounter for fracture with nonunion: Secondary | ICD-10-CM | POA: Diagnosis not present

## 2024-05-17 ENCOUNTER — Ambulatory Visit (INDEPENDENT_AMBULATORY_CARE_PROVIDER_SITE_OTHER): Admitting: Family Medicine

## 2024-05-17 ENCOUNTER — Encounter (HOSPITAL_BASED_OUTPATIENT_CLINIC_OR_DEPARTMENT_OTHER): Payer: Self-pay | Admitting: Family Medicine

## 2024-05-17 VITALS — BP 99/63 | HR 101 | Temp 98.3°F | Resp 16 | Wt 140.0 lb

## 2024-05-17 DIAGNOSIS — Z1231 Encounter for screening mammogram for malignant neoplasm of breast: Secondary | ICD-10-CM | POA: Diagnosis not present

## 2024-05-17 DIAGNOSIS — M545 Low back pain, unspecified: Secondary | ICD-10-CM

## 2024-05-17 DIAGNOSIS — G8929 Other chronic pain: Secondary | ICD-10-CM

## 2024-05-17 NOTE — Progress Notes (Signed)
 Established Patient Office Visit  Subjective   Patient ID: Paula Hamilton, female    DOB: 25-May-1951  Age: 73 y.o. MRN: [id number]  Chief Complaint  Patient presents with   Follow-up    Follow-up    F/u as above.  Please see last note for details.  Recent visit with Neurosurgery for her chronic lumbago.  Paula Hamilton seems content with her progress on this.  We also reviewed her recent breast imaging.    Past Medical History:  Diagnosis Date   Anxiety    Asthma    CAD (coronary artery disease)    with previous MI, f/by [facility]   Chronic pain    due to OA and Neuropathy f/by Dr. Joshua of Neurosurgery   CKD (chronic kidney disease) stage 3, GFR 30-59 ml/min [facility])    with Nephrology declined   Controlled type 2 diabetes mellitus with neuropathy (HCC)    COPD (chronic obstructive pulmonary disease) (HCC)    Depression    Diabetic retinopathy (HCC)    Sees eye doctor   Gastric ulcer with hemorrhage    2025, f/by [facility]   GERD (gastroesophageal reflux disease)    Hx of migraines    Hypercholesterolemia    Hypertension    Lung nodule    2025, with serial CT scans advised   Osteoporosis    Prolia declined   Pneumonia    Pneumovax declined   Seizures (HCC) 09/2018   w/stroke in 2020   Stroke [facility]) 09/15/2018   multiple, f/by [facility]   Tobacco abuse     Outpatient Encounter Medications as of 05/17/2024  Medication Sig   Bempedoic Acid-Ezetimibe  (NEXLIZET ) 180-10 MG TABS Take by mouth daily.   clopidogrel  (PLAVIX ) 75 MG tablet Take 75 mg by mouth daily.   diphenoxylate -atropine  (LOMOTIL ) 2.5-0.025 MG tablet Take 1 tablet by mouth 3 (three) times daily as needed for diarrhea or loose stools.   DULoxetine  (CYMBALTA ) 30 MG capsule Take 30 mg by mouth daily.   empagliflozin  (JARDIANCE ) 10 MG TABS tablet Take 1 tablet (10 mg total) by mouth daily before breakfast.   insulin  glargine (LANTUS  SOLOSTAR) 100 UNIT/ML Solostar Pen Inject 40 Units into the skin in the  morning.   levETIRAcetam  (KEPPRA ) 500 MG tablet Take 1 tablet (500 mg total) by mouth 2 (two) times daily.   lisinopril  (ZESTRIL ) 2.5 MG tablet Take 1 tablet (2.5 mg total) by mouth daily.   nitroGLYCERIN  (NITROSTAT ) 0.4 MG SL tablet Place 1 tablet (0.4 mg total) under the tongue every 5 (five) minutes as needed.   traMADol  (ULTRAM ) 50 MG tablet Take 50 mg by mouth every 6 (six) hours as needed.   No facility-administered encounter medications on file as of 05/17/2024.    Social History   Tobacco Use   Smoking status: Every Day    Current packs/day: 0.00    Average packs/day: 1 pack/day for 30.0 years (30.0 ttl pk-yrs)    Types: Cigarettes    Start date: 05/21/1981    Last attempt to quit: 05/22/2011    Years since quitting: 12.9   Smokeless tobacco: Never   Tobacco comments:    Started smoking again in back in 2012 per patient on 04/29/23.  Vaping Use   Vaping status: Never Used  Substance Use Topics   Alcohol  use: Not Currently    Comment: occasional beer   Drug use: No      ROS    Objective:     BP 99/63 (Cuff Size:  Normal)   Pulse (!) 101   Temp 98.3 F (36.8 C) (Oral)   Resp 16   Wt 140 lb (63.5 kg)   SpO2 96%   BMI 24.80 kg/m    Physical Exam Constitutional:      General: Paula Hamilton is not in acute distress.    Appearance: Normal appearance.  HENT:     Head: Normocephalic.  Cardiovascular:     Rate and Rhythm: Normal rate and regular rhythm.     Pulses: Normal pulses.     Heart sounds: Normal heart sounds.  Pulmonary:     Effort: Pulmonary effort is normal.     Breath sounds: Normal breath sounds.  Abdominal:     General: Bowel sounds are normal.     Palpations: Abdomen is soft.  Musculoskeletal:     Cervical back: Neck supple. No tenderness.     Right lower leg: No edema.     Left lower leg: No edema.  Neurological:     Mental Status: Paula Hamilton is alert.      No results found for any visits on 05/17/24.    The ASCVD Risk score (Arnett DK, et  al., 2019) failed to calculate for the following reasons:   Risk score cannot be calculated because patient has a medical history suggesting prior/existing ASCVD    Assessment & Plan:  Chronic bilateral low back pain without sciatica Assessment & Plan: Stable.  F/u with specialist as directed.  Other cancer screening, etc addressed in last note.   Breast cancer screening by mammogram -     3D Screening Mammogram, Left and Right; Future    Return in about 3 months (around 08/17/2024) for chronic follow-up.    REDDING PONCE NORLEEN FALCON., MD

## 2024-05-17 NOTE — Assessment & Plan Note (Addendum)
 Stable.  F/u with specialist as directed.  Other cancer screening, etc addressed in last note.

## 2024-05-18 ENCOUNTER — Other Ambulatory Visit (HOSPITAL_BASED_OUTPATIENT_CLINIC_OR_DEPARTMENT_OTHER): Admitting: Radiology

## 2024-05-23 NOTE — Telephone Encounter (Signed)
 Called patient again at this time and she was given her lab results per Dr. Karry review. Patient denies any questions/concerns.   Alan, RN

## 2024-05-28 ENCOUNTER — Telehealth (HOSPITAL_BASED_OUTPATIENT_CLINIC_OR_DEPARTMENT_OTHER): Payer: Self-pay | Admitting: Family Medicine

## 2024-05-28 ENCOUNTER — Other Ambulatory Visit (HOSPITAL_BASED_OUTPATIENT_CLINIC_OR_DEPARTMENT_OTHER): Payer: Self-pay | Admitting: Family Medicine

## 2024-05-28 DIAGNOSIS — J329 Chronic sinusitis, unspecified: Secondary | ICD-10-CM

## 2024-05-28 MED ORDER — AZITHROMYCIN 250 MG PO TABS: ORAL_TABLET | ORAL | 0 refills | Status: AC

## 2024-05-28 NOTE — Telephone Encounter (Signed)
 Copied from CRM #[id number]. Topic: Clinical - Medication Question >> May 28, 2024 10:54 AM Deaijah H wrote: Reason for CRM: Patient called in would like to know if Dr. Dottie could call Z-pax.

## 2024-06-07 ENCOUNTER — Telehealth: Payer: Self-pay | Admitting: Pharmacy Technician

## 2024-06-07 NOTE — Telephone Encounter (Signed)
 Pharmacy Patient Advocate Encounter  Received notification from [facility] that Prior Authorization for nexlizet  has been APPROVED from 06/07/24 to 07/04/25   PA #/Case ID/Reference #: [id number]

## 2024-06-07 NOTE — Telephone Encounter (Signed)
   Pharmacy Patient Advocate Encounter   Received notification from CoverMyMeds that prior authorization for nexlizetis required/requested.   Insurance verification completed.   The patient is insured through [employer].   Per test claim: PA required; PA submitted to above mentioned insurance via Latent Key/confirmation #/EOC [id number] Status is pending

## 2024-06-19 ENCOUNTER — Ambulatory Visit: Admitting: Diagnostic Neuroimaging

## 2024-07-09 ENCOUNTER — Telehealth (HOSPITAL_BASED_OUTPATIENT_CLINIC_OR_DEPARTMENT_OTHER): Payer: Self-pay | Admitting: *Deleted

## 2024-07-09 ENCOUNTER — Other Ambulatory Visit (HOSPITAL_BASED_OUTPATIENT_CLINIC_OR_DEPARTMENT_OTHER): Payer: Self-pay | Admitting: Family Medicine

## 2024-07-09 NOTE — Telephone Encounter (Signed)
 Copied from CRM #[id number]. Topic: General - Other >> Jul 06, 2024  9:48 AM Deaijah H wrote: Reason for CRM: Patient called in requesting to speak with Aniyah regarding diabetic supplies. Insurance will not pay for supplies. Please call.   ----------------------------------------------------------------------- From previous Reason for Contact - Medication Question: Reason for CRM:

## 2024-07-17 ENCOUNTER — Telehealth (HOSPITAL_BASED_OUTPATIENT_CLINIC_OR_DEPARTMENT_OTHER): Payer: Self-pay | Admitting: Family Medicine

## 2024-07-17 NOTE — Telephone Encounter (Signed)
 Copied from CRM #[id number]. Topic: Clinical - Medication Question >> Jul 17, 2024  8:53 AM Treva T wrote: Reason for CRM: Pt calling inquiring on previous conversation had in regards to diabetic testing supplies.  States a letter was brought to provider for what insurance will cover.   Pt reports letter should be at office for what insurance will cover.  Pt is requesting those supplies to be sent to pharmacy.  Supplies needed are: Testing strips Needles (#8 size) New meter (insurance will not cover OneTouch)  Pt is requesting a follow up call when this has been completed or if need to discuss further.  Can be reached at [phone].  Aware of same day call back.    [address] [place], [place] - [address] [place] [address] Phone: [phone] Fax: [phone]

## 2024-07-18 ENCOUNTER — Other Ambulatory Visit (HOSPITAL_BASED_OUTPATIENT_CLINIC_OR_DEPARTMENT_OTHER): Payer: Self-pay

## 2024-07-18 ENCOUNTER — Telehealth (HOSPITAL_BASED_OUTPATIENT_CLINIC_OR_DEPARTMENT_OTHER): Payer: Self-pay

## 2024-07-18 DIAGNOSIS — E118 Type 2 diabetes mellitus with unspecified complications: Secondary | ICD-10-CM

## 2024-07-18 MED ORDER — ACCU-CHEK GUIDE W/DEVICE KIT
PACK | 0 refills | Status: AC
  Filled 2024-07-18: qty 1, 1d supply, fill #0

## 2024-07-18 MED ORDER — PEN NEEDLES 31G X 5 MM MISC
1.0000 | Freq: Four times a day (QID) | 3 refills | Status: AC
  Filled 2024-07-18: qty 100, 100d supply, fill #0

## 2024-07-18 MED ORDER — ACCU-CHEK GUIDE TEST VI STRP
ORAL_STRIP | Freq: Every day | 3 refills | Status: AC
  Filled 2024-07-18: qty 200, 50d supply, fill #0

## 2024-07-18 MED ORDER — ACCU-CHEK SOFTCLIX LANCETS MISC
3 refills | Status: AC
  Filled 2024-07-18: qty 200, 50d supply, fill #0

## 2024-07-18 NOTE — Telephone Encounter (Signed)
 Patient's husband came into the facility and stated the patient Rossi his spouse) had an appointment las week and gave a form to the CMA who then showed it to the provider regarding the 2 glucose monitors that her insurance would cover. After a few calls to the facility and waiting on a reply they came in today 07/18/2024 and stated she needed the script for the monitor and supplies due to almost being out. I checked with pharmacy tech who stated their insurance would cover Accu check and this information was told to the provider who was working today for advisement.

## 2024-07-26 ENCOUNTER — Other Ambulatory Visit (HOSPITAL_BASED_OUTPATIENT_CLINIC_OR_DEPARTMENT_OTHER): Payer: Self-pay | Admitting: Family Medicine

## 2024-08-09 ENCOUNTER — Ambulatory Visit: Admitting: Cardiology

## 2024-08-17 ENCOUNTER — Ambulatory Visit (HOSPITAL_BASED_OUTPATIENT_CLINIC_OR_DEPARTMENT_OTHER): Admitting: Family Medicine

## 2024-10-12 ENCOUNTER — Ambulatory Visit: Admitting: Cardiology
